# Patient Record
Sex: Male | Born: 1940 | Race: White | Hispanic: No | Smoking: Former smoker
Health system: Southern US, Community
[De-identification: ages and names within clinical notes are randomized; demographics above are authoritative.]

## PROBLEM LIST (undated history)

## (undated) DIAGNOSIS — I10 Essential (primary) hypertension: Secondary | ICD-10-CM

## (undated) DIAGNOSIS — G43909 Migraine, unspecified, not intractable, without status migrainosus: Secondary | ICD-10-CM

## (undated) DIAGNOSIS — R51 Headache: Secondary | ICD-10-CM

## (undated) DIAGNOSIS — J9601 Acute respiratory failure with hypoxia: Secondary | ICD-10-CM

## (undated) DIAGNOSIS — J189 Pneumonia, unspecified organism: Secondary | ICD-10-CM

## (undated) DIAGNOSIS — R519 Headache, unspecified: Secondary | ICD-10-CM

## (undated) DIAGNOSIS — I82409 Acute embolism and thrombosis of unspecified deep veins of unspecified lower extremity: Secondary | ICD-10-CM

## (undated) DIAGNOSIS — Z9981 Dependence on supplemental oxygen: Secondary | ICD-10-CM

## (undated) DIAGNOSIS — K219 Gastro-esophageal reflux disease without esophagitis: Secondary | ICD-10-CM

## (undated) DIAGNOSIS — I5032 Chronic diastolic (congestive) heart failure: Secondary | ICD-10-CM

## (undated) DIAGNOSIS — J449 Chronic obstructive pulmonary disease, unspecified: Secondary | ICD-10-CM

## (undated) HISTORY — DX: Chronic obstructive pulmonary disease, unspecified: J44.9

## (undated) HISTORY — DX: Pneumonia, unspecified organism: J18.9

## (undated) HISTORY — PX: INGUINAL HERNIA REPAIR: SUR1180

## (undated) HISTORY — DX: Essential (primary) hypertension: I10

---
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
## 1988-10-07 DIAGNOSIS — I82409 Acute embolism and thrombosis of unspecified deep veins of unspecified lower extremity: Secondary | ICD-10-CM

## 1988-10-07 HISTORY — DX: Acute embolism and thrombosis of unspecified deep veins of unspecified lower extremity: I82.409

## 1997-06-01 ENCOUNTER — Ambulatory Visit (HOSPITAL_COMMUNITY): Admission: RE | Admit: 1997-06-01 | Discharge: 1997-06-01 | Payer: Self-pay

## 2003-04-20 ENCOUNTER — Inpatient Hospital Stay (HOSPITAL_COMMUNITY): Admission: EM | Admit: 2003-04-20 | Discharge: 2003-04-23 | Payer: Self-pay

## 2003-05-11 ENCOUNTER — Encounter: Admission: RE | Admit: 2003-05-11 | Discharge: 2003-05-11 | Payer: Self-pay | Admitting: Internal Medicine

## 2003-12-16 ENCOUNTER — Inpatient Hospital Stay (HOSPITAL_COMMUNITY): Admission: EM | Admit: 2003-12-16 | Discharge: 2003-12-20 | Payer: Self-pay | Admitting: Emergency Medicine

## 2003-12-28 ENCOUNTER — Ambulatory Visit: Payer: Self-pay | Admitting: Critical Care Medicine

## 2003-12-30 ENCOUNTER — Ambulatory Visit: Payer: Self-pay | Admitting: Critical Care Medicine

## 2004-01-28 ENCOUNTER — Ambulatory Visit: Payer: Self-pay

## 2004-01-28 ENCOUNTER — Ambulatory Visit: Payer: Self-pay | Admitting: Cardiology

## 2004-02-10 ENCOUNTER — Ambulatory Visit: Payer: Self-pay | Admitting: Critical Care Medicine

## 2004-03-09 ENCOUNTER — Ambulatory Visit: Payer: Self-pay | Admitting: Critical Care Medicine

## 2004-05-02 ENCOUNTER — Ambulatory Visit: Payer: Self-pay | Admitting: Critical Care Medicine

## 2005-06-30 ENCOUNTER — Ambulatory Visit: Payer: Self-pay | Admitting: Critical Care Medicine

## 2005-08-31 ENCOUNTER — Ambulatory Visit: Payer: Self-pay | Admitting: Critical Care Medicine

## 2005-10-27 ENCOUNTER — Ambulatory Visit: Payer: Self-pay | Admitting: Pulmonary Disease

## 2008-10-21 ENCOUNTER — Inpatient Hospital Stay (HOSPITAL_COMMUNITY): Admission: EM | Admit: 2008-10-21 | Discharge: 2008-11-06 | Payer: Self-pay | Admitting: Emergency Medicine

## 2008-10-21 ENCOUNTER — Ambulatory Visit: Payer: Self-pay | Admitting: Critical Care Medicine

## 2008-10-21 ENCOUNTER — Ambulatory Visit: Payer: Self-pay | Admitting: Cardiology

## 2008-10-26 ENCOUNTER — Encounter (INDEPENDENT_AMBULATORY_CARE_PROVIDER_SITE_OTHER): Payer: Self-pay | Admitting: Internal Medicine

## 2008-10-30 ENCOUNTER — Ambulatory Visit: Payer: Self-pay | Admitting: Infectious Diseases

## 2008-11-09 ENCOUNTER — Ambulatory Visit: Payer: Self-pay | Admitting: Critical Care Medicine

## 2008-11-09 DIAGNOSIS — J449 Chronic obstructive pulmonary disease, unspecified: Secondary | ICD-10-CM | POA: Insufficient documentation

## 2008-11-09 DIAGNOSIS — I10 Essential (primary) hypertension: Secondary | ICD-10-CM

## 2008-11-11 ENCOUNTER — Telehealth: Payer: Self-pay | Admitting: Critical Care Medicine

## 2008-11-24 ENCOUNTER — Ambulatory Visit: Payer: Self-pay | Admitting: Critical Care Medicine

## 2008-12-03 ENCOUNTER — Ambulatory Visit: Payer: Self-pay | Admitting: Critical Care Medicine

## 2008-12-03 ENCOUNTER — Ambulatory Visit: Admission: RE | Admit: 2008-12-03 | Discharge: 2008-12-03 | Payer: Self-pay | Admitting: Critical Care Medicine

## 2008-12-03 ENCOUNTER — Encounter: Payer: Self-pay | Admitting: Critical Care Medicine

## 2008-12-04 ENCOUNTER — Encounter: Payer: Self-pay | Admitting: Critical Care Medicine

## 2008-12-25 ENCOUNTER — Ambulatory Visit: Payer: Self-pay | Admitting: Critical Care Medicine

## 2008-12-25 ENCOUNTER — Encounter (INDEPENDENT_AMBULATORY_CARE_PROVIDER_SITE_OTHER): Payer: Self-pay | Admitting: *Deleted

## 2008-12-25 DIAGNOSIS — K219 Gastro-esophageal reflux disease without esophagitis: Secondary | ICD-10-CM

## 2008-12-30 ENCOUNTER — Telehealth: Payer: Self-pay | Admitting: Critical Care Medicine

## 2009-01-08 ENCOUNTER — Ambulatory Visit: Payer: Self-pay | Admitting: Critical Care Medicine

## 2009-01-08 ENCOUNTER — Telehealth: Payer: Self-pay | Admitting: Critical Care Medicine

## 2009-02-15 ENCOUNTER — Telehealth: Payer: Self-pay | Admitting: Internal Medicine

## 2009-03-09 ENCOUNTER — Ambulatory Visit: Payer: Self-pay | Admitting: Critical Care Medicine

## 2009-03-09 ENCOUNTER — Encounter: Payer: Self-pay | Admitting: Critical Care Medicine

## 2009-03-09 ENCOUNTER — Telehealth: Payer: Self-pay | Admitting: Critical Care Medicine

## 2009-03-24 ENCOUNTER — Ambulatory Visit: Payer: Self-pay | Admitting: Critical Care Medicine

## 2009-03-26 ENCOUNTER — Telehealth: Payer: Self-pay | Admitting: Critical Care Medicine

## 2009-04-16 ENCOUNTER — Ambulatory Visit: Payer: Self-pay | Admitting: Critical Care Medicine

## 2009-04-20 ENCOUNTER — Telehealth: Payer: Self-pay | Admitting: Critical Care Medicine

## 2009-07-30 ENCOUNTER — Ambulatory Visit: Payer: Self-pay | Admitting: Critical Care Medicine

## 2009-11-09 ENCOUNTER — Ambulatory Visit: Payer: Self-pay | Admitting: Critical Care Medicine

## 2010-01-24 ENCOUNTER — Inpatient Hospital Stay (HOSPITAL_COMMUNITY)
Admission: EM | Admit: 2010-01-24 | Discharge: 2010-01-28 | Payer: Self-pay | Source: Home / Self Care | Attending: Pulmonary Disease | Admitting: Pulmonary Disease

## 2010-02-01 ENCOUNTER — Telehealth: Payer: Self-pay | Admitting: Critical Care Medicine

## 2010-02-02 ENCOUNTER — Ambulatory Visit
Admission: RE | Admit: 2010-02-02 | Discharge: 2010-02-02 | Payer: Self-pay | Source: Home / Self Care | Attending: Critical Care Medicine | Admitting: Critical Care Medicine

## 2010-02-02 ENCOUNTER — Ambulatory Visit: Payer: Self-pay | Admitting: Critical Care Medicine

## 2010-02-02 DIAGNOSIS — J189 Pneumonia, unspecified organism: Secondary | ICD-10-CM | POA: Insufficient documentation

## 2010-02-11 ENCOUNTER — Encounter: Payer: Self-pay | Admitting: Critical Care Medicine

## 2010-02-11 ENCOUNTER — Telehealth (INDEPENDENT_AMBULATORY_CARE_PROVIDER_SITE_OTHER): Payer: Self-pay | Admitting: *Deleted

## 2010-03-01 ENCOUNTER — Encounter: Payer: Self-pay | Admitting: Critical Care Medicine

## 2010-03-02 ENCOUNTER — Ambulatory Visit
Admission: RE | Admit: 2010-03-02 | Discharge: 2010-03-02 | Payer: Self-pay | Source: Home / Self Care | Attending: Critical Care Medicine | Admitting: Critical Care Medicine

## 2010-03-04 ENCOUNTER — Telehealth (INDEPENDENT_AMBULATORY_CARE_PROVIDER_SITE_OTHER): Payer: Self-pay | Admitting: *Deleted

## 2010-03-10 NOTE — Progress Notes (Signed)
Summary: Needs OV  Phone Note Outgoing Call   Reason for Call: Confirm/change Appt Summary of Call: needs post hosp ov  Initial call taken by: Storm Frisk MD,  February 01, 2010 2:27 PM  Follow-up for Phone Call        Pt already has OV scheduled for 12/28/11at 9:45 am with PW.    Called, spoke with pt.  He was reminded of pending appt for tomorrow.  He verbalized understanding. Follow-up by: Gweneth Dimitri RN,  February 01, 2010 3:24 PM

## 2010-03-10 NOTE — Miscellaneous (Signed)
Summary: cxr order  Clinical Lists Changes  Problems: Reactivated problem of PNEUMONIA (ICD-486) Orders: Added new Test order of T-2 View CXR (71020TC) - Signed

## 2010-03-10 NOTE — Progress Notes (Signed)
Summary: Try levaquin 750 x 5 days for green sputum  Phone Note Call from Patient Call back at Home Phone [phone]   Caller: Patient Call For: wright Summary of Call: Taking mucinex dm for congestion, isn't working, please advise. Initial call taken by: Darletta Moll,  February 15, 2009 9:15 AM  Follow-up for Phone Call        [facility]. Carron Curie CMA  February 15, 2009 9:37 AM   Spoke with pt; chest congestion since Christmas; productive cough-green. SOB and wheezing; denies fever,chills,N&V,D. Pt using Mucinex DM two times a day already. Please advise of rx to give pt and other things he can do.  try levaquin 750 one daily x 5 days  ALLERGIES:Byslin, Codeine.  Reynaldo Minium CMA  February 15, 2009 9:51 AM  Follow-up by: Nyoka Cowden MD,  February 15, 2009 11:40 AM  Additional Follow-up for Phone Call Additional follow up Details #1::        rx sent. pt aware. Carron Curie CMA  February 15, 2009 11:41 AM     New/Updated Medications: LEVAQUIN 750 MG TABS (LEVOFLOXACIN) Take 1 tablet by mouth once a day Prescriptions: LEVAQUIN 750 MG TABS (LEVOFLOXACIN) Take 1 tablet by mouth once a day  #5 x 0   Entered by:   Carron Curie CMA   Authorized by:   Nyoka Cowden MD   Signed by:   Carron Curie CMA on 02/15/2009   Method used:   Electronically to        [facility]  [address] [phone]* (retail)       [address]       [place], [place]  [address]       Ph: [phone]       Fax: [phone]   RxID:   [id number]

## 2010-03-10 NOTE — Letter (Signed)
Summary: Out of Work  [employer]  [address]   [place], [place] [address]   Phone: [phone]  Fax: [phone]    04/16/2009  TO: Alfonso Patten     [facility] Specialist     [facility]     [address] [place], [place]  [address]  RE: [name] [name] [address] [id number]       The above named individual is currently under my care and has chronic lung disease.  He is on oxygen therapy at night and uses portable oxygen as needed during the day.  The portable device is not a hazardous material.   He may keep this oxygen device in his truck for as needed  use.      If you have any further questions or need additional information, please call.     Sincerely,    typed by: Storm Frisk MD

## 2010-03-10 NOTE — Assessment & Plan Note (Signed)
Summary: Pulmonary OV   Primary Provider/Referring Provider:  [facility]  CC:  Post hospital follow up with CXR.  States breathing is "excellent."  Cough has improved.  Mucus is now pale yellow and almost clear.  wheezing at times.  Denies chest tightness and SOB.Marland Kitchen  History of Present Illness: Pulmonary OV: 70 yo WM with COPD Golds Stage III.    November 09, 2009 9:30 AM Pt is now back to work.  NOw is worse over past two weeks . Pt is more congested and cough is productive of green mucus.  No real sinus drip.   Cold air in work environment.  No chest pain. No f/c/s.  No edema in feet.   February 02, 2010 9:44 AM This pt was in [facility] 12/19 -12/23 for PNA LLL.  All c/s were negative.  Pt had airspace disease in the LLL.  Now dyspnea is better than before.  Mucus is less.  Mucus is now not discolored.  There is no chest pain.    now: min cough as before.  mucus comes out,  green white color.  No real chest pain.  Dyspnea is better compared to before.  There is no fever/chills/sweats.  There is no excess mucus now.    Current Medications (verified): 1)  Advair Diskus 250-50 Mcg/dose Aepb (Fluticasone-Salmeterol) .Marland Kitchen.. 1 Puff  Twice Daily 2)  Proair Hfa 108 (90 Base) Mcg/act  Aers (Albuterol Sulfate) .Marland Kitchen.. 1-2 Puffs Every 4-6 Hours As Needed 3)  Spiriva Handihaler 18 Mcg  Caps (Tiotropium Bromide Monohydrate) .... Two Puffs in Handihaler Daily 4)  Avelox 400 Mg Tabs (Moxifloxacin Hcl) .... Take 1 Tablet By Mouth Once A Day 5)  Mucinex Dm 30-600 Mg Xr12h-Tab (Dextromethorphan-Guaifenesin) .... Take 1 Tablet By Mouth Two Times A Day 6)  Oxygen .Marland Kitchen.. 3l Continuous 7)  Hydrochlorothiazide 25 Mg Tabs (Hydrochlorothiazide) .... Take 1 Tablet By Mouth Once A Day  Allergies (verified): 1)  ! * Byslin 2)  ! Codeine  Past History:  Past medical, surgical, family and social histories (including risk factors) reviewed for relevance to current acute and chronic problems.  Past  Medical History: COPD (ICD-496) Chronic BRONCHITIS (ICD-490) PNEUMONIA (ICD-486)     -Hflu RUL PNA and resp failure 9/15- 11/06/08     - Recurrent CAP NOS LLL 12/19- 01/28/10. Hypertension  Past Surgical History: Reviewed history from 11/09/2008 and no changes required. Tonsillectomy Hernia surgery 1990's  Family History: Reviewed history from 11/09/2008 and no changes required. mother-breast ca  Social History: Reviewed history from 07/30/2009 and no changes required. quit in 2000.  <1ppd x54yrs.   divorced lives alone Works with [employer]  Review of Systems       The patient complains of shortness of breath with activity and productive cough.  The patient denies shortness of breath at rest, non-productive cough, coughing up blood, chest pain, irregular heartbeats, acid heartburn, indigestion, loss of appetite, weight change, abdominal pain, difficulty swallowing, sore throat, tooth/dental problems, headaches, nasal congestion/difficulty breathing through nose, sneezing, itching, ear ache, anxiety, depression, hand/feet swelling, joint stiffness or pain, rash, change in color of mucus, and fever.    Vital Signs:  Patient profile:   70 year old male Height:      67 inches Weight:      151.38 pounds BMI:     23.80 O2 Sat:      92 % on 3 L/mincont Temp:     98.3 degrees F oral Pulse rate:  104 / minute BP sitting:   128 / 80  (right arm) Cuff size:   regular  Vitals Entered By: Gweneth Dimitri RN (February 02, 2010 9:35 AM)  O2 Flow:  3 L/mincont CC: Post hospital follow up with CXR.  States breathing is "excellent."  Cough has improved.  Mucus is now pale yellow and almost clear.  wheezing at times.  Denies chest tightness and SOB. Comments Medications reviewed with patient Daytime contact number verified with patient. Gweneth Dimitri RN  February 02, 2010 9:36 AM    Physical Exam  Additional Exam:  General: thin, well developed adult in NAD HEENT: [place]/AT.   Mucus membranes pink/moist.  No JVD.  No lymphadenopathy.  Neuro: Awake, alert, oriented.  MAE, speech clear.  EOMI, PERRLA CV: S1S2 RRR, no murmurs/rubs/gallops.  No carotid bruits. Radial pulses +2.   Pulm: Resp's even and non-labored.  Lungs with distant BS,  GI: abdomen round/soft, non-tender to palpation.  BSx4 active. No organomegaly. Skin: no rashes or lesions Ext: warm/dry.  No edema      Clinical Reports Reviewed:  CXR:  03/09/2009: CXR Results:  Findings: Findings compatible architectural distortion of the medial aspect of the right lung.  No definite active infiltrate. Focal scarring left base as well.  Normal cardiomediastinal silhouette.  COPD.   IMPRESSION: Stable right midlung zone parenchymal density suggesting scarring. No definite acute infiltrate.    11/24/2008: CXR Results:  IMPRESSION: Persistent airspace disease right midlung and right middle lobe without significant change in aeration.  No pulmonary edema.  11/09/2008: CXR Results:  Findings: Persistent air space disease is seen in a right perihilar distribution.  Air space disease does extend into the right upper lobe and right middle lobe.  There are also changes within the superior segment of the right lower lobe. When compared to prior study, there has been no significant interval change in aeration. There is volume loss noted in this region.  The left lung is clear. The heart is normal in size.   IMPRESSION: Persistent air space disease with volume loss noted as described above.  There has been no significant change in aeration when compared with 11/04/2008.   Impression & Recommendations:  Problem # 1:  PNEUMONIA (ICD-486) Assessment Improved Recurent LLL CAP NOS.  Now improved in setting of advanced COPD plan finish current avelox rx cont oxygen 24/7 use flutter valve four times daily cont BD therapy  His updated medication list for this problem includes:    Avelox 400 Mg Tabs  (Moxifloxacin hcl) .Marland Kitchen... Take 1 tablet by mouth once a day  Orders: Est. Patient Level IV (84696)  Medications Added to Medication List This Visit: 1)  Avelox 400 Mg Tabs (Moxifloxacin hcl) .... Take 1 tablet by mouth once a day 2)  Mucinex Dm 30-600 Mg Xr12h-tab (Dextromethorphan-guaifenesin) .... Take 1 tablet by mouth two times a day 3)  Oxygen  .Marland Kitchen.. 3l continuous 4)  Hydrochlorothiazide 25 Mg Tabs (Hydrochlorothiazide) .... Take 1 tablet by mouth once a day 5)  Flutter Devi (Respiratory therapy supplies) .... Use 4 times daily  Complete Medication List: 1)  Advair Diskus 250-50 Mcg/dose Aepb (Fluticasone-salmeterol) .Marland Kitchen.. 1 puff  twice daily 2)  Proair Hfa 108 (90 Base) Mcg/act Aers (Albuterol sulfate) .Marland Kitchen.. 1-2 puffs every 4-6 hours as needed 3)  Spiriva Handihaler 18 Mcg Caps (Tiotropium bromide monohydrate) .... Two puffs in handihaler daily 4)  Avelox 400 Mg Tabs (Moxifloxacin hcl) .... Take 1 tablet by mouth once a day 5)  Mucinex  Dm 30-600 Mg Xr12h-tab (Dextromethorphan-guaifenesin) .... Take 1 tablet by mouth two times a day 6)  Oxygen  .Marland Kitchen.. 3l continuous 7)  Hydrochlorothiazide 25 Mg Tabs (Hydrochlorothiazide) .... Take 1 tablet by mouth once a day 8)  Flutter Devi (Respiratory therapy supplies) .... Use 4 times daily  Other Orders: DME Referral (DME)  Patient Instructions: 1)  Return one month , chest xray first 2)  Finish antibiotics 3)  A light weight portable oxygen system will be obtained 4)  No other medication changes 5)  Use flutter valve 4 times daily 6)  Stay on oxygen 3Liters

## 2010-03-10 NOTE — Assessment & Plan Note (Signed)
Summary: Pulmonary OV   Primary Provider/Referring Provider:  [facility]  CC:  1 month follow up with CXR.  Pt states SOB comes and goes.  c/o chest congestion x 4 days.  Sligh cough - prod with small amount of green mucus x 2 days.  Denies f/c/s.Marland Kitchen  History of Present Illness: Pulmonary OV: 70 yo WM with COPD Golds Stage III.    November 09, 2009 9:30 AM Pt is now back to work.  NOw is worse over past two weeks . Pt is more congested and cough is productive of green mucus.  No real sinus drip.   Cold air in work environment.  No chest pain. No f/c/s.  No edema in feet.   February 02, 2010 9:44 AM This pt was in [facility] 12/19 -12/23 for PNA LLL.  All c/s were negative.  Pt had airspace disease in the LLL.  Now dyspnea is better than before.  Mucus is less.  Mucus is now not discolored.  There is no chest pain.    now: min cough as before.  mucus comes out,  green white color.  No real chest pain.  Dyspnea is better compared to before.  There is no fever/chills/sweats.  There is no excess mucus now  March 02, 2010 11:51 AM f/u pna.  notes sl cough last 3-4 days.  feels like URI,  is back at work. No real chest pain, notes some tightness.Mucus now is sl discolored.  Dyspnea is better.  min pn drip.      Current Medications (verified): 1)  Advair Diskus 250-50 Mcg/dose Aepb (Fluticasone-Salmeterol) .Marland Kitchen.. 1 Puff  Twice Daily 2)  Proair Hfa 108 (90 Base) Mcg/act  Aers (Albuterol Sulfate) .Marland Kitchen.. 1-2 Puffs Every 4-6 Hours As Needed 3)  Spiriva Handihaler 18 Mcg  Caps (Tiotropium Bromide Monohydrate) .... Two Puffs in Handihaler Daily 4)  Mucinex Dm 30-600 Mg Xr12h-Tab (Dextromethorphan-Guaifenesin) .... Take 1 Tablet By Mouth Two Times A Day 5)  Oxygen .Marland Kitchen.. 3l Continuous At Bedtime 6)  Hydrochlorothiazide 25 Mg Tabs (Hydrochlorothiazide) .... Take 1 Tablet By Mouth Once A Day 7)  Flutter  Devi (Respiratory Therapy Supplies) .... Use 4 Times Daily  Allergies (verified): 1)  ! *  Byslin 2)  ! Codeine  Past History:  Past medical, surgical, family and social histories (including risk factors) reviewed, and no changes noted (except as noted below).  Past Medical History: Reviewed history from 02/02/2010 and no changes required. COPD (ICD-496) Chronic BRONCHITIS (ICD-490) PNEUMONIA (ICD-486)     -Hflu RUL PNA and resp failure 9/15- 11/06/08     - Recurrent CAP NOS LLL 12/19- 01/28/10. Hypertension  Past Surgical History: Reviewed history from 11/09/2008 and no changes required. Tonsillectomy Hernia surgery 1990's  Family History: Reviewed history from 11/09/2008 and no changes required. mother-breast ca  Social History: Reviewed history from 07/30/2009 and no changes required. quit in 2000.  <1ppd x89yrs.   divorced lives alone Works with [employer]  Review of Systems       The patient complains of shortness of breath with activity, productive cough, and non-productive cough.  The patient denies shortness of breath at rest, coughing up blood, chest pain, irregular heartbeats, acid heartburn, indigestion, loss of appetite, weight change, abdominal pain, difficulty swallowing, sore throat, tooth/dental problems, headaches, nasal congestion/difficulty breathing through nose, sneezing, itching, ear ache, anxiety, depression, hand/feet swelling, joint stiffness or pain, rash, change in color of mucus, and fever.    Vital Signs:  Patient  profile:   70 year old male Height:      67 inches Weight:      144.38 pounds BMI:     22.69 O2 Sat:      93 % on Room air Temp:     98.4 degrees F oral Pulse rate:   91 / minute BP sitting:   116 / 70  (right arm) Cuff size:   regular  Vitals Entered By: Gweneth Dimitri RN (March 02, 2010 11:36 AM)  O2 Flow:  Room air CC: 1 month follow up with CXR.  Pt states SOB comes and goes.  c/o chest congestion x 4 days.  Sligh cough - prod with small amount of green mucus x 2 days.  Denies f/c/s. Comments  Medications reviewed with patient Daytime contact number verified with patient. Gweneth Dimitri RN  March 02, 2010 11:37 AM    Physical Exam  Additional Exam:  General: thin, well developed adult in NAD HEENT: [place]/AT.  Mucus membranes pink/moist.  No JVD.  No lymphadenopathy.  Neuro: Awake, alert, oriented.  MAE, speech clear.  EOMI, PERRLA CV: S1S2 RRR, no murmurs/rubs/gallops.  No carotid bruits. Radial pulses +2.   Pulm: Resp's even and non-labored.  Lungs with distant BS,  GI: abdomen round/soft, non-tender to palpation.  BSx4 active. No organomegaly. Skin: no rashes or lesions Ext: warm/dry.  No edema      CXR  Procedure date:  03/02/2010  Findings:      IMPRESSION:   1.  Persistent lingular airspace opacity.  Recommend continued follow-up to clearing. 2.  Emphysema.  Impression & Recommendations:  Problem # 1:  PNEUMONIA (ICD-486) Assessment Improved Cap with persistent LLL airspace disease c/w organized pneumonia but acute infection now cleared plan cont to follow cxr to clearance no further ABX Orders: Est. Patient Level III (04540)  Problem # 2:  COPD (ICD-496) Assessment: Improved  COPD Golds stage III. Stable, improved oxygenation s/p recent CAP  plan cont oxygen at bedtime , as needed daytime oxygen No change in inhaled medications.   Maintain treatment program as currently prescribed.    Medications Added to Medication List This Visit: 1)  Oxygen  .Marland Kitchen.. 3l continuous at bedtime  Complete Medication List: 1)  Advair Diskus 250-50 Mcg/dose Aepb (Fluticasone-salmeterol) .Marland Kitchen.. 1 puff  twice daily 2)  Proair Hfa 108 (90 Base) Mcg/act Aers (Albuterol sulfate) .Marland Kitchen.. 1-2 puffs every 4-6 hours as needed 3)  Spiriva Handihaler 18 Mcg Caps (Tiotropium bromide monohydrate) .... Two puffs in handihaler daily 4)  Mucinex Dm 30-600 Mg Xr12h-tab (Dextromethorphan-guaifenesin) .... Take 1 tablet by mouth two times a day 5)  Oxygen  .Marland Kitchen.. 3l continuous at bedtime 6)   Hydrochlorothiazide 25 Mg Tabs (Hydrochlorothiazide) .... Take 1 tablet by mouth once a day 7)  Flutter Devi (Respiratory therapy supplies) .... Use 4 times daily  Patient Instructions: 1)  No change in medications 2)  Return in    3      months

## 2010-03-10 NOTE — Progress Notes (Signed)
Summary: note for work  Phone Note Call from Patient Call back at [employer] [phone]   Caller: Patient Call For: wright Summary of Call: Pt states he needs a note to return to work on Monday. Initial call taken by: Darletta Moll,  February 11, 2010 12:47 PM  Follow-up for Phone Call        Pt was in the hospital and saw Dr. Delford Field on 02-02-10 for HFU. Pt needs a letter stating it is ok for him to return to work on 02-14-10. pt will pick-up letter when it its ready. Please advsie if ok to write letter.  Carron Curie CMA  February 11, 2010 12:53 PM   Additional Follow-up for Phone Call Additional follow up Details #1::        ok for the note Additional Follow-up by: Storm Frisk MD,  February 11, 2010 1:39 PM    Additional Follow-up for Phone Call Additional follow up Details #2::    Letter was created and left up front for pt to pick up.  Pt made aware. Follow-up by: Vernie Murders,  February 11, 2010 2:21 PM

## 2010-03-10 NOTE — Letter (Signed)
Summary: [employer]  [address]   [place], [place] [address]   Phone: [phone]  Fax: [phone]    Today's Date: February 11, 2010  Name of Patient: [name] [name]  The above named patient had a medical visit on 02/02/10.  Please take this into consideration when reviewing the time away from work/school.    Special Instructions:  [  ] None  [  ] To be off the remainder of today, returning to the normal work / school schedule tomorrow.  [  ] To be off until the next scheduled appointment on ______________________.  [ x ] Other- Pt may return to work on Monday, January 9th, 2012   Sincerely yours,        Dr. Shan Levans

## 2010-03-10 NOTE — Assessment & Plan Note (Signed)
Summary: Pulmonary OV   Primary Provider/Referring Provider:  [facility]  CC:  2 mo follow up.  c/o prod cough with green mucus and wheezing x4 days.  denies chest tightness and fever.  states breathing is the same-still having good days and bad days.Marland Kitchen  History of Present Illness: Pulmonary OV: 70 yo WM with COPD Golds Stage III.  Recent admit 9/10 forH Flu CAP and VDRF.  Not seen since in office since  2007.  admitted 9/15-10/1 HFlu PNA Cap, septic shock,  vent support The pt presented with resp distress The pt was ill tues prior to admit The pt then noted wed 2am  suddenly sob and low bp and tfr to ED from [facility]. Rx vent/ steroids/ABX .  D/C 11/06/08.    December 25, 2008  Pt presents for follow up on PNA.  Recently admitted from 9/15-10/1 for VDRF/PNA at [facility].  Bronchoscopy performed on 10/28 with normal tissue returned, mucus plugging RML.  Patient reports that he feels better and is continuing to use his IS at home-is able to pull 2500 volumes.  Reports mild shortness of breath this am but relates it to anxiety regarding appointment mix-up today. Minimial non-productive cough and shortness of breath with activity.  At office he was able to walk down to the lab and back up without O2 and reports he tolerated well.   He indicates he is ready to return back to work.  Relates that most of his activity is later in the day at work.  He also complains of burping and reflux symptoms.  Pt denies any significant sore throat, nasal congestion or excess secretions, fever, chills, sweats, unintended weight loss, pleurtic or exertional chest pain, orthopnea PND, or leg swelling   January 08, 2009 11:04 AM Pt is improved with improved oxygenation.  The pt uses oxygen at night and  is off portable system Pt is now back at work Has a sl cold now with am mucous  sl yellow-green Pt had FOB with mucous plugging seen in RML  no CA seen.March 09, 2009 9:24 AM c/o bad cold.  three days.   Notes some mucous in the am moderate amount. No chest pain.  Sl amount of wheezes.  No real fever. Sinuses are congested.   Working now.  Preventive Screening-Counseling & Management  Alcohol-Tobacco     Smoking Status: quit > 6 months  Current Medications (verified): 1)  Advair Diskus 250-50 Mcg/dose Aepb (Fluticasone-Salmeterol) .Marland Kitchen.. 1 Puff Two Times A Day 2)  Combivent 103-18 Mcg/act Aero (Ipratropium-Albuterol) .... 2 Puffs Every 4 Hours As Needed 3)  Oxygen .... 2liter Rest ;  3 Liter Exertion 4)  Spiriva Handihaler 18 Mcg  Caps (Tiotropium Bromide Monohydrate) .... Two Puffs in Handihaler Daily  Allergies (verified): 1)  ! * Byslin 2)  ! Codeine  Past History:  Past medical, surgical, family and social histories (including risk factors) reviewed, and no changes noted (except as noted below).  Past Medical History: Reviewed history from 11/09/2008 and no changes required. COPD (ICD-496) BRONCHITIS (ICD-490) PNEUMONIA (ICD-486)     -Hflu RUL PNA and resp failure 9/15- 11/06/08 Hypertension  Past Surgical History: Reviewed history from 11/09/2008 and no changes required. Tonsillectomy Hernia surgery 1990's  Family History: Reviewed history from 11/09/2008 and no changes required. mother-breast ca  Social History: Reviewed history from 11/09/2008 and no changes required. Patient never smoked.  quit in 2000.  <1ppd x74yrs.   divorced lives alone Works with [employer]  Schools  Review of Systems       The patient complains of shortness of breath with activity, shortness of breath at rest, productive cough, non-productive cough, acid heartburn, nasal congestion/difficulty breathing through nose, and change in color of mucus.  The patient denies coughing up blood, chest pain, irregular heartbeats, indigestion, loss of appetite, weight change, abdominal pain, difficulty swallowing, sore throat, tooth/dental problems, headaches, sneezing, itching, ear ache, anxiety,  depression, hand/feet swelling, joint stiffness or pain, rash, and fever.    Vital Signs:  Patient profile:   70 year old male Height:      67 inches Weight:      149.38 pounds BMI:     23.48 O2 Sat:      87 % on Room air Temp:     98.2 degrees F oral Pulse rate:   102 / minute BP sitting:   120 / 78  (left arm) Cuff size:   regular  Vitals Entered By: Gweneth Dimitri RN (March 09, 2009 9:18 AM)  O2 Flow:  Room air  O2 Sat Comments arrived to exam room with o2 sat of 87% ra.  After resting for a few minutes, o2 sat increased to 93% ra with pulse of 94.  Crystal Jones RN  March 09, 2009 9:22 AM  CC: 2 mo follow up.  c/o prod cough with green mucus and wheezing x4 days.  denies chest tightness and fever.  states breathing is the same-still having good days and bad days. Comments Medications reviewed with patient Daytime contact number verified with patient. Gweneth Dimitri RN  March 09, 2009 9:20 AM    Physical Exam  Additional Exam:  General: thin, well developed adult in NAD HEENT: [place]/AT.  Mucus membranes pink/moist.  No JVD.  No lymphadenopathy.  Neuro: Awake, alert, oriented.  MAE, speech clear.  EOMI, PERRLA CV: S1S2 RRR, no murmurs/rubs/gallops.  No carotid bruits. Radial pulses +2.   Pulm: Resp's even and non-labored.  Lungs with exp wheezes and rhonchi RLL GI: abdomen round/soft, non-tender to palpation.  BSx4 active. No organomegaly. Skin: no rashes or lesions Ext: warm/dry.  No edema      Impression & Recommendations:  Problem # 1:  BRONCHITIS (ICD-490) Assessment Deteriorated  Acute tracheobronchitits with flare Plan: Avelox 400mg /d for 7 days Pulse prednisone CXR today The following medications were removed from the medication list:    Levaquin 750 Mg Tabs (Levofloxacin) .Marland Kitchen... Take 1 tablet by mouth once a day His updated medication list for this problem includes:    Advair Diskus 250-50 Mcg/dose Aepb (Fluticasone-salmeterol) .Marland Kitchen... 1 puff two times  a day    Combivent 103-18 Mcg/act Aero (Ipratropium-albuterol) .Marland Kitchen... 2 puffs every 4 hours as needed    Spiriva Handihaler 18 Mcg Caps (Tiotropium bromide monohydrate) .Marland Kitchen..Marland Kitchen Two puffs in handihaler daily    Avelox 400 Mg Tabs (Moxifloxacin hcl) ..... By mouth daily  Orders: Est. Patient Level IV (04540) Prescription Created Electronically [phone]) T-2 View CXR (71020TC)  Medications Added to Medication List This Visit: 1)  Avelox 400 Mg Tabs (Moxifloxacin hcl) .... By mouth daily 2)  Prednisone 10 Mg Tabs (Prednisone) .... Take as directed 4 each am x 4 days, 3 x 4 days, 2 x 4 days, 1 x 4 days then stop  Complete Medication List: 1)  Advair Diskus 250-50 Mcg/dose Aepb (Fluticasone-salmeterol) .Marland Kitchen.. 1 puff two times a day 2)  Combivent 103-18 Mcg/act Aero (Ipratropium-albuterol) .... 2 puffs every 4 hours as needed 3)  Oxygen  .Marland KitchenMarland KitchenMarland Kitchen  2liter rest ;  3 liter exertion 4)  Spiriva Handihaler 18 Mcg Caps (Tiotropium bromide monohydrate) .... Two puffs in handihaler daily 5)  Avelox 400 Mg Tabs (Moxifloxacin hcl) .... By mouth daily 6)  Prednisone 10 Mg Tabs (Prednisone) .... Take as directed 4 each am x 4 days, 3 x 4 days, 2 x 4 days, 1 x 4 days then stop  Patient Instructions: 1)  Avelox for 7days 2)  Prednisone 10mg  4 each am x 4 days, 3 x 4 days, 2 x 4 days, 1 x 4 days then stop 3)  Return 2-3 weeks Prescriptions: PREDNISONE 10 MG  TABS (PREDNISONE) Take as directed 4 each am x 4 days, 3 x 4 days, 2 x 4 days, 1 x 4 days then stop  #40 x 0   Entered and Authorized by:   Storm Frisk MD   Signed by:   Storm Frisk MD on 03/09/2009   Method used:   [facility] to        [facility]  [address] [phone][facility] (retail)       [address]       [place], [place]  [address]       Ph: [phone]       Fax: [phone]   RxID:   [id number] AVELOX 400 MG  TABS (MOXIFLOXACIN HCL) By mouth daily  #7 x 0   Entered and Authorized by:   Storm Frisk MD   Signed by:    Storm Frisk MD on 03/09/2009   Method used:   [facility]        [facility]  [address] [phone]* (retail)       [address]       [place], [place]  [address]       Ph: [phone]       Fax: [phone]   RxID:   [id number]

## 2010-03-10 NOTE — Assessment & Plan Note (Signed)
Summary: Pulmonary OV   Primary Provider/Referring Provider:  [facility]  CC:  6 month follow up. Pt c/o SOB on hot days, chest congestion, dry cough to clear throat, and wheezing. Pt denies chest tightness. Pt states does not use O2 at bedtime as often. Pt requesting refills on inhaled medications.  History of Present Illness: Pulmonary OV: 70 yo WM with COPD Golds Stage III.      July 30, 2009 10:43 AM Not much cough.  Occ will wheeze in throat area and will clear the throat.  Dyspnea is better than before.  Overal lis much better.  Not wearing oxygen Pt denies any significant sore throat, nasal congestion or excess secretions, fever, chills, sweats, unintended weight loss, pleurtic or exertional chest pain, orthopnea PND, or leg swelling Pt denies any increase in rescue therapy over baseline, denies waking up needing it or having any early am or nocturnal exacerbations of coughing/wheezing/or dyspnea. Pt also denies any obvious fluctuation in symptoms wiht weather or environmental change or other alleviating or aggravating factors     Preventive Screening-Counseling & Management  Alcohol-Tobacco     Smoking Status: quit  Allergies: 1)  ! * Byslin 2)  ! Codeine  Past History:  Past medical, surgical, family and social histories (including risk factors) reviewed, and no changes noted (except as noted below).  Past Medical History: Reviewed history from 11/09/2008 and no changes required. COPD (ICD-496) BRONCHITIS (ICD-490) PNEUMONIA (ICD-486)     -Hflu RUL PNA and resp failure 9/15- 11/06/08 Hypertension  Past Surgical History: Reviewed history from 11/09/2008 and no changes required. Tonsillectomy Hernia surgery 1990's  Family History: Reviewed history from 11/09/2008 and no changes required. mother-breast ca  Social History: Reviewed history from 11/09/2008 and no changes required. quit in 2000.  <1ppd x34yrs.   divorced lives alone Works with  [employer] Smoking Status:  quit  Review of Systems       The patient complains of shortness of breath with activity and non-productive cough.  The patient denies shortness of breath at rest, productive cough, coughing up blood, chest pain, irregular heartbeats, acid heartburn, indigestion, loss of appetite, weight change, abdominal pain, difficulty swallowing, sore throat, tooth/dental problems, headaches, nasal congestion/difficulty breathing through nose, sneezing, itching, ear ache, anxiety, depression, hand/feet swelling, joint stiffness or pain, rash, change in color of mucus, and fever.    Vital Signs:  Patient profile:   70 year old male Height:      67 inches Weight:      149 pounds BMI:     23.42 O2 Sat:      93 % on Room air Temp:     98.7 degrees F oral Pulse rate:   89 / minute BP sitting:   126 / 84  (left arm) Cuff size:   regular  Vitals Entered By: Zackery Barefoot CMA (July 30, 2009 10:36 AM)  O2 Flow:  Room air CC: 6 month follow up. Pt c/o SOB on hot days, chest congestion, dry cough to clear throat, wheezing. Pt denies chest tightness. Pt states does not use O2 at bedtime as often. Pt requesting refills on inhaled medications Comments Medications reviewed with patient Verified contact number and pharmacy with patient Zackery Barefoot CMA  July 30, 2009 10:36 AM    Physical Exam  Additional Exam:  General: thin, well developed adult in NAD HEENT: /AT.  Mucus membranes pink/moist.  No JVD.  No lymphadenopathy.  Neuro: Awake, alert, oriented.  MAE, speech clear.  EOMI, PERRLA CV: S1S2 RRR, no murmurs/rubs/gallops.  No carotid bruits. Radial pulses +2.   Pulm: Resp's even and non-labored.  Lungs with distant BS,  no wheeze or rales or rhonchi GI: abdomen round/soft, non-tender to palpation.  BSx4 active. No organomegaly. Skin: no rashes or lesions Ext: warm/dry.  No edema      Impression & Recommendations:  Problem # 1:  COPD  (ICD-496) Assessment Improved  COPD Golds stage III. Stable, improved oxygenation  plan cont oxygen at bedtime but d/c daytime oxygen and exetional oxygen No change in inhaled medications.   Maintain treatment program as currently prescribed.    Medications Added to Medication List This Visit: 1)  Proair Hfa 108 (90 Base) Mcg/act Aers (Albuterol sulfate) .Marland Kitchen.. 1-2 puffs every 4-6 hours as needed 2)  Oxygen  .... 2liter at bedtime  Complete Medication List: 1)  Advair Diskus 250-50 Mcg/dose Aepb (Fluticasone-salmeterol) .Marland Kitchen.. 1 puff two times a day 2)  Proair Hfa 108 (90 Base) Mcg/act Aers (Albuterol sulfate) .Marland Kitchen.. 1-2 puffs every 4-6 hours as needed 3)  Oxygen  .... 2liter at bedtime 4)  Spiriva Handihaler 18 Mcg Caps (Tiotropium bromide monohydrate) .... Two puffs in handihaler daily  Other Orders: Est. Patient Level III (04540) DME Referral (DME)  Patient Instructions: 1)  No change in medications 2)  Return in      4    months 3)  May stop portable oxygen  4)  Stay on nighttime oxygen  Prescriptions: ADVAIR DISKUS 250-50 MCG/DOSE AEPB (FLUTICASONE-SALMETEROL) 1 puff two times a day  #1 x 6   Entered and Authorized by:   Storm Frisk MD   Signed by:   Storm Frisk MD on 07/30/2009   Method used:   [facility]        [facility]  [address] [phone]* (retail)       [address]       [place], [place]  [address]       Ph: [phone]       Fax: [phone]   RxID:   [id number] PROAIR HFA 108 (90 BASE) MCG/ACT  AERS (ALBUTEROL SULFATE) 1-2 puffs every 4-6 hours as needed  #1 x 6   Entered and Authorized by:   Storm Frisk MD   Signed by:   Storm Frisk MD on 07/30/2009   Method used:   [facility]        [facility]  [address] [phone]* (retail)       [address]       [place], [place]  [address]       Ph: [phone]       Fax: [phone]   RxID:   [id number]

## 2010-03-10 NOTE — Assessment & Plan Note (Signed)
Summary: Pulmonary OV   Primary Provider/Referring Provider:  [facility]  CC:  4 month follow up.  States breathing is doing worse over the past couple of weeks.  Having chest congestion and prod cough with greenish yellow mucus x 2 wks.  SOB after coughing.  Denies wheezing, chest tightness, and f/c/s.  Requesting flu vac today.  Marland Kitchen  History of Present Illness: Pulmonary OV: 70 yo WM with COPD Golds Stage III.    November 09, 2009 9:30 AM Pt is now back to work.  NOw is worse over past two weeks . Pt is more congested and cough is productive of green mucus.  No real sinus drip.   Cold air in work environment.  No chest pain. No f/c/s.  No edema in feet.   Preventive Screening-Counseling & Management  Alcohol-Tobacco     Smoking Status: quit     Year Quit: 2007     Pack years: 12  Current Medications (verified): 1)  Advair Diskus 250-50 Mcg/dose Aepb (Fluticasone-Salmeterol) .Marland Kitchen.. 1 Puff Once Daily 2)  Proair Hfa 108 (90 Base) Mcg/act  Aers (Albuterol Sulfate) .Marland Kitchen.. 1-2 Puffs Every 4-6 Hours As Needed 3)  Spiriva Handihaler 18 Mcg  Caps (Tiotropium Bromide Monohydrate) .... Two Puffs in Handihaler Daily  Allergies (verified): 1)  ! * Byslin 2)  ! Codeine  Past History:  Past medical, surgical, family and social histories (including risk factors) reviewed, and no changes noted (except as noted below).  Past Medical History: Reviewed history from 11/09/2008 and no changes required. COPD (ICD-496) BRONCHITIS (ICD-490) PNEUMONIA (ICD-486)     -Hflu RUL PNA and resp failure 9/15- 11/06/08 Hypertension  Past Surgical History: Reviewed history from 11/09/2008 and no changes required. Tonsillectomy Hernia surgery 1990's  Family History: Reviewed history from 11/09/2008 and no changes required. mother-breast ca  Social History: Reviewed history from 07/30/2009 and no changes required. quit in 2000.  <1ppd x7yrs.   divorced lives alone Works with [employer]  Review of Systems       The patient complains of shortness of breath with activity, productive cough, nasal congestion/difficulty breathing through nose, and change in color of mucus.  The patient denies shortness of breath at rest, non-productive cough, coughing up blood, chest pain, irregular heartbeats, acid heartburn, indigestion, loss of appetite, weight change, abdominal pain, difficulty swallowing, sore throat, tooth/dental problems, headaches, sneezing, itching, ear ache, anxiety, depression, hand/feet swelling, joint stiffness or pain, rash, and fever.    Vital Signs:  Patient profile:   70 year old male Height:      67 inches Weight:      151 pounds BMI:     23.74 O2 Sat:      88 % on Room air Temp:     98.2 degrees F oral Pulse rate:   101 / minute BP sitting:   150 / 88  (left arm) Cuff size:   regular  Vitals Entered By: Gweneth Dimitri RN (November 09, 2009 9:24 AM)  O2 Flow:  Room air  O2 Sat Comments Pt arrived to exam room with o2 sat 88% RA.  After resting for a few minutes, o2 sat increased to 90% RA.  Gweneth Dimitri RN  November 09, 2009 9:25 AM  CC: 4 month follow up.  States breathing is doing worse over the past couple of weeks.  Having chest congestion and prod cough with greenish yellow mucus x 2 wks.  SOB after coughing.  Denies wheezing,  chest tightness, f/c/s.  Requesting flu vac today.   Comments Medications reviewed with patient Daytime contact number verified with patient. Gweneth Dimitri RN  November 09, 2009 9:26 AM    Physical Exam  Additional Exam:  General: thin, well developed adult in NAD HEENT: [place]/AT.  Mucus membranes pink/moist.  No JVD.  No lymphadenopathy.  Neuro: Awake, alert, oriented.  MAE, speech clear.  EOMI, PERRLA CV: S1S2 RRR, no murmurs/rubs/gallops.  No carotid bruits. Radial pulses +2.   Pulm: Resp's even and non-labored.  Lungs with distant BS, scattered rhonchi, exp wheezes GI: abdomen round/soft, non-tender to palpation.   BSx4 active. No organomegaly. Skin: no rashes or lesions Ext: warm/dry.  No edema      Impression & Recommendations:  Problem # 1:  ACUTE BRONCHITIS (ICD-466.0) Assessment Deteriorated acute bronchitis with copd flare plan increase advair back to one puff two times a day pulse pred doxy x 7days cont spiriva flu vaccine His updated medication list for this problem includes:    Advair Diskus 250-50 Mcg/dose Aepb (Fluticasone-salmeterol) .Marland Kitchen... 1 puff  twice daily    Proair Hfa 108 (90 Base) Mcg/act Aers (Albuterol sulfate) .Marland Kitchen... 1-2 puffs every 4-6 hours as needed    Spiriva Handihaler 18 Mcg Caps (Tiotropium bromide monohydrate) .Marland Kitchen..Marland Kitchen Two puffs in handihaler daily    Doxycycline Monohydrate 100 Mg Caps (Doxycycline monohydrate) ..... By mouth twice daily  Orders: Prescription Created Electronically [phone]) Est. Patient Level IV (25956)  Medications Added to Medication List This Visit: 1)  Advair Diskus 250-50 Mcg/dose Aepb (Fluticasone-salmeterol) .Marland Kitchen.. 1 puff  twice daily 2)  Prednisone 10 Mg Tabs (Prednisone) .... Take as directed take 4 daily for two days, then 3 daily for two days, then two daily for two days then one daily for two days then stop 3)  Doxycycline Monohydrate 100 Mg Caps (Doxycycline monohydrate) .... By mouth twice daily  Complete Medication List: 1)  Advair Diskus 250-50 Mcg/dose Aepb (Fluticasone-salmeterol) .Marland Kitchen.. 1 puff  twice daily 2)  Proair Hfa 108 (90 Base) Mcg/act Aers (Albuterol sulfate) .Marland Kitchen.. 1-2 puffs every 4-6 hours as needed 3)  Spiriva Handihaler 18 Mcg Caps (Tiotropium bromide monohydrate) .... Two puffs in handihaler daily 4)  Prednisone 10 Mg Tabs (Prednisone) .... Take as directed take 4 daily for two days, then 3 daily for two days, then two daily for two days then one daily for two days then stop 5)  Doxycycline Monohydrate 100 Mg Caps (Doxycycline monohydrate) .... By mouth twice daily  Other Orders: Flu Vaccine 61yrs + MEDICARE PATIENTS  (L8756) Administration Flu vaccine - MCR (E3329) DME Referral (DME)  Patient Instructions: 1)  Flu vaccine today 2)  Doxycycline one twice daily for 7days 3)  Prednisone 10mg  Take 4 daily for two days, then 3 daily for two days, then two daily for two days then one daily for two days then stop 4)  Increase advair to one puff two times a day 5)  No change in spiriva  6)  Stop oxygen 7)  Return 2 months Prescriptions: DOXYCYCLINE MONOHYDRATE 100 MG  CAPS (DOXYCYCLINE MONOHYDRATE) By mouth twice daily  #14 x 0   Entered and Authorized by:   Storm Frisk MD   Signed by:   Storm Frisk MD on 11/09/2009   Method used:   [facility]  [address] [id number]       [address]       [place]  [place], [place]  [address]       Ph: [phone]       Fax: [phone]   RxID:   [id number] PREDNISONE 10 MG  TABS (PREDNISONE) Take as directed Take 4 daily for two days, then 3 daily for two days, then two daily for two days then one daily for two days then stop  #20 x 0   Entered and Authorized by:   Storm Frisk MD   Signed by:   Storm Frisk MD on 11/09/2009   Method used:   [facility]        [facility]  [address] [phone]* (retail)       [address]       [place], [place]  [address]       Ph: [phone]       Fax: [phone]   RxID:   [id number]     Prevention & Chronic Care Immunizations   Influenza vaccine: Fluvax 3+  (11/09/2009)    Tetanus booster: Not documented    Pneumococcal vaccine: Pneumovax  (11/06/2008)    H. zoster vaccine: Not documented  Colorectal Screening   Hemoccult: Not documented    Colonoscopy: Not documented  Other Screening   PSA: Not documented   Smoking status: quit  (11/09/2009)  Lipids   Total Cholesterol: Not documented   LDL: Not documented   LDL Direct: Not documented   HDL: Not documented   Triglycerides: Not documented  Hypertension   Last  Blood Pressure: 150 / 88  (11/09/2009)   Serum creatinine: Not documented   Serum potassium Not documented  Self-Management Support :    Hypertension self-management support: Not documented   Nursing Instructions: Give Flu vaccine today  Flu Vaccine Consent Questions     Do you have a history of severe allergic reactions to this vaccine? no    Any prior history of allergic reactions to egg and/or gelatin? no    Do you have a sensitivity to the preservative Thimersol? no    Do you have a past history of Guillan-Barre Syndrome? no    Do you currently have an acute febrile illness? no    Have you ever had a severe reaction to latex? no    Vaccine information given and explained to patient? yes    Are you currently pregnant? no    Lot Number:[id number]   Exp Date:08/06/2010   Site Given  Left Deltoid IMdflu Gweneth Dimitri RN  November 09, 2009 9:46 AM

## 2010-03-10 NOTE — Miscellaneous (Signed)
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  No definite active infiltrate. Focal scarring left base as well.  Normal cardiomediastinal silhouette.  COPD.   IMPRESSION: Stable right midlung zone parenchymal density suggesting scarring. No definite acute infiltrate.   (03/09/2009 10:37)      CXR  Procedure date:  03/09/2009  Findings:      Findings: Findings compatible architectural distortion of the medial aspect of the right lung.  No definite active infiltrate. Focal scarring left base as well.  Normal cardiomediastinal silhouette.  COPD.   IMPRESSION: Stable right midlung zone parenchymal density suggesting scarring. No definite acute infiltrate.

## 2010-03-10 NOTE — Miscellaneous (Signed)
Summary: cxr order  Clinical Lists Changes  Orders: Added new Test order of T-2 View CXR (71020TC) - Signed 

## 2010-03-10 NOTE — Progress Notes (Signed)
Summary: sick - requesting abx  Phone Note Call from Patient Call back at Home Phone [phone]   Caller: Patient Call For: wright Reason for Call: Acute Illness, Talk to Nurse Summary of Call: Patient was saw Dr. Drema Dallas.  He is calling saying he is more congested and cough is productive of green mucus.  Asking for abx.  [facility] [address] Initial call taken by: Lehman Prom,  March 04, 2010 3:36 PM  Follow-up for Phone Call        ATC # provided above - [facility] Crystal Yetta Barre RN  March 04, 2010 4:30 PM  Pt was last seen by PW on 03/02/10 -- spoke with pt.  States chest congestion and cough are getting worse since Wednesday.  States cough is prod and "heavy."  The amount of mucus he is coughing up has also increased some.  Mucus is green.  Slight increased SOB and wheezing, chest tightness in left side.  Denies f/c/s.  Requestin abx   [facility] Allergies (verified):  ! * BYSLIN ! CODEINE  PW out of the office -- Dr. Vassie Loll, pls advise.  Thanks!  Follow-up by: Gweneth Dimitri RN,  March 04, 2010 4:54 PM  Additional Follow-up for Phone Call Additional follow up Details #1::        avelox 400 once daily  x 7  If no beter by next week, arrange OV Additional Follow-up by: Comer Locket. Vassie Loll MD,  March 04, 2010 5:12 PM    Additional Follow-up for Phone Call Additional follow up Details #2::    Called, spoke with pt.  he was informed of above recs per RA and verbalized understanding.  [facility]   Follow-up by: Gweneth Dimitri RN,  March 04, 2010 5:15 PM  New/Updated Medications: AVELOX ABC PACK 400 MG TABS (MOXIFLOXACIN HCL) once daily Prescriptions: AVELOX ABC PACK 400 MG TABS (MOXIFLOXACIN HCL) once daily  #7 x 0   Entered and Authorized by:   Comer Locket Vassie Loll MD   Signed by:   Comer Locket Vassie Loll MD on 03/04/2010   Method used:   [facility]        [facility]  [address] [id number]       [address]       [place], [place]  [address]       Ph: [phone]       Fax: [phone]   RxID:   [id number]   Appended Document: sick - requesting abx thanks

## 2010-03-10 NOTE — Progress Notes (Signed)
Summary: CXR results  Phone Note Outgoing Call   Reason for Call: Discuss lab or test results Summary of Call: Pls let the patient know his CXR was normal.  Stay on medications as prescribed today Initial call taken by: Storm Frisk MD,  March 09, 2009 10:39 AM  Follow-up for Phone Call        called, spoke with pt.  Pt informed of above cxr results and recs per PW.  He verbalized understanding.  Follow-up by: Gweneth Dimitri RN,  March 09, 2009 12:34 PM

## 2010-03-10 NOTE — Assessment & Plan Note (Signed)
Summary: Pulmonary OV   Primary Provider/Referring Provider:  [facility]  CC:  3 wk follow up.  states chest congestion started back Sunday night and also coughing up a lot green mucus, increased SOB with activity, and and wheezing since Sunday. denies fever.Marland Kitchen  History of Present Illness: Pulmonary OV: 70 yo WM with COPD Golds Stage III.  Recent admit 9/10 forH Flu CAP and VDRF.  Not seen since in office since  2007.  admitted 9/15-10/1/10 HFlu PNA Cap, septic shock,  vent support The pt presented with resp distress The pt was ill tues prior to admit The pt then noted wed 2am  suddenly sob and low bp and tfr to ED from [facility]. Rx vent/ steroids/ABX .  D/C 11/06/08.    December 25, 2008  Pt presents for follow up on PNA.  Recently admitted from 9/15-10/1 for VDRF/PNA at [facility].  Bronchoscopy performed on 10/28 with normal tissue returned, mucus plugging RML.  Patient reports that he feels better and is continuing to use his IS at home-is able to pull 2500 volumes.  Reports mild shortness of breath this am but relates it to anxiety regarding appointment mix-up today. Minimial non-productive cough and shortness of breath with activity.  At office he was able to walk down to the lab and back up without O2 and reports he tolerated well.   He indicates he is ready to return back to work.  Relates that most of his activity is later in the day at work.  He also complains of burping and reflux symptoms.  Pt denies any significant sore throat, nasal congestion or excess secretions, fever, chills, sweats, unintended weight loss, pleurtic or exertional chest pain, orthopnea PND, or leg swelling   January 08, 2009 11:04 AM Pt is improved with improved oxygenation.  The pt uses oxygen at night and  is off portable system Pt is now back at work Has a sl cold now with am mucous  sl yellow-green Pt had FOB with mucous plugging seen in RML  no CA seen.March 09, 2009 9:24 AM c/o bad cold.  three  days.  Notes some mucous in the am moderate amount. No chest pain.  Sl amount of wheezes.  No real fever. Sinuses are congested.   Working now.  March 24, 2009 3:26 PM The pt was rx wtih avelox and pred pulse. 03/09/09. The pt  got better and then worse again over past weekend.   The pts stomach started cramping as well.  No real chest pain except from coughing.  Notes not as much wheezing.  No real fever.    Preventive Screening-Counseling & Management  Alcohol-Tobacco     Smoking Status: quit > 6 months  Clinical Reports Reviewed:  CXR:  03/09/2009: CXR Results:  Findings: Findings compatible architectural distortion of the medial aspect of the right lung.  No definite active infiltrate. Focal scarring left base as well.  Normal cardiomediastinal silhouette.  COPD.   IMPRESSION: Stable right midlung zone parenchymal density suggesting scarring. No definite acute infiltrate.    11/24/2008: CXR Results:  IMPRESSION: Persistent airspace disease right midlung and right middle lobe without significant change in aeration.  No pulmonary edema.  11/09/2008: CXR Results:  Findings: Persistent air space disease is seen in a right perihilar distribution.  Air space disease does extend into the right upper lobe and right middle lobe.  There are also changes within the superior segment of the right lower lobe. When compared to prior  study, there has been no significant interval change in aeration. There is volume loss noted in this region.  The left lung is clear. The heart is normal in size.   IMPRESSION: Persistent air space disease with volume loss noted as described above.  There has been no significant change in aeration when compared with 11/04/2008.   Current Medications (verified): 1)  Advair Diskus 250-50 Mcg/dose Aepb (Fluticasone-Salmeterol) .Marland Kitchen.. 1 Puff Two Times A Day 2)  Combivent 103-18 Mcg/act Aero (Ipratropium-Albuterol) .... 2 Puffs Every 4 Hours As Needed 3)   Oxygen .... 2liter Rest ;  3 Liter Exertion 4)  Spiriva Handihaler 18 Mcg  Caps (Tiotropium Bromide Monohydrate) .... Two Puffs in Handihaler Daily 5)  Prednisone 10 Mg  Tabs (Prednisone) .... Take As Directed 4 Each Am X 4 Days, 3 X 4 Days, 2 X 4 Days, 1 X 4 Days Then Stop  Allergies (verified): 1)  ! * Byslin 2)  ! Codeine  Past History:  Past medical, surgical, family and social histories (including risk factors) reviewed, and no changes noted (except as noted below).  Past Medical History: Reviewed history from 11/09/2008 and no changes required. COPD (ICD-496) BRONCHITIS (ICD-490) PNEUMONIA (ICD-486)     -Hflu RUL PNA and resp failure 9/15- 11/06/08 Hypertension  Past Surgical History: Reviewed history from 11/09/2008 and no changes required. Tonsillectomy Hernia surgery 1990's  Family History: Reviewed history from 11/09/2008 and no changes required. mother-breast ca  Social History: Reviewed history from 11/09/2008 and no changes required. Patient never smoked.  quit in 2000.  <1ppd x32yrs.   divorced lives alone Works with [employer]  Review of Systems       The patient complains of shortness of breath with activity, shortness of breath at rest, productive cough, non-productive cough, chest pain, acid heartburn, indigestion, and change in color of mucus.  The patient denies coughing up blood, irregular heartbeats, loss of appetite, weight change, abdominal pain, difficulty swallowing, sore throat, tooth/dental problems, headaches, nasal congestion/difficulty breathing through nose, sneezing, itching, ear ache, anxiety, depression, hand/feet swelling, joint stiffness or pain, rash, and fever.    Vital Signs:  Patient profile:   70 year old male Height:      67 inches Weight:      142.50 pounds O2 Sat:      94 % on 2 L/mincont Temp:     98.1 degrees F oral Pulse rate:   109 / minute BP sitting:   114 / 66  (right arm) Cuff size:   regular  Vitals  Entered By: Gweneth Dimitri RN (March 24, 2009 3:10 PM)  O2 Flow:  2 L/mincont CC: 3 wk follow up.  states chest congestion started back Sunday night and also coughing up a lot green mucus, increased SOB with activity, and wheezing since Sunday. denies fever. Comments Medications reviewed with patient Daytime contact number verified with patient. Gweneth Dimitri RN  March 24, 2009 3:10 PM    Physical Exam  Additional Exam:  General: thin, well developed adult in NAD HEENT: [place]/AT.  Mucus membranes pink/moist.  No JVD.  No lymphadenopathy.  Neuro: Awake, alert, oriented.  MAE, speech clear.  EOMI, PERRLA CV: S1S2 RRR, no murmurs/rubs/gallops.  No carotid bruits. Radial pulses +2.   Pulm: Resp's even and non-labored.  Lungs with exp wheezes and rhonchi RLL GI: abdomen round/soft, non-tender to palpation.  BSx4 active. No organomegaly. Skin: no rashes or lesions Ext: warm/dry.  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 By mouth daily His updated medication list for this problem includes:    Advair Diskus 250-50 Mcg/dose Aepb (Fluticasone-salmeterol) .Marland Kitchen... 1 puff two times a day    Combivent 103-18 Mcg/act Aero (Ipratropium-albuterol) .Marland Kitchen... 2 puffs every 4 hours as needed    Spiriva Handihaler 18 Mcg Caps (Tiotropium bromide monohydrate) .Marland Kitchen..Marland Kitchen Two puffs in handihaler daily    Ciprofloxacin Hcl 750 Mg Tabs (Ciprofloxacin hcl) ..... One by mouth two times a day  generic please  Orders: Est. Patient Level IV ([address]) T-Culture, Sputum & Gram Stain (87070/[id number]) Prescription Created Electronically [phone])  Problem # 2:  COPD  (ICD-496) Assessment: Deteriorated  COPD Golds stage III.  Recent VDRF with HFLU CAP.  Now slowly better  plan cont oxygen and inhaled meds  see assessment number one   Medications Added to Medication List This Visit: 1)  Prednisone 10 Mg Tabs (Prednisone) .... Take as directed 4 each am x 4 days, 3 x 4 days, 2 x 4 days, then one a day and stay 2)  Ciprofloxacin Hcl 750 Mg Tabs (Ciprofloxacin hcl) .... One by mouth two times a day  generic please  Complete Medication List: 1)  Advair Diskus 250-50 Mcg/dose Aepb (Fluticasone-salmeterol) .Marland Kitchen.. 1 puff two times a day 2)  Combivent 103-18 Mcg/act Aero (Ipratropium-albuterol) .... 2 puffs every 4 hours as needed 3)  Oxygen  .... 2liter rest ;  3 liter exertion 4)  Spiriva Handihaler 18 Mcg Caps (Tiotropium bromide monohydrate) .... Two puffs in handihaler daily 5)  Prednisone 10 Mg Tabs (Prednisone) .... Take as directed 4 each am x 4 days, 3 x 4 days, 2 x 4 days, then one a day and stay 6)  Ciprofloxacin Hcl 750 Mg Tabs (Ciprofloxacin hcl) .... One by mouth two times a day  generic please  Patient Instructions: 1)  Repulse prednisone 4 each am x 4 days, 3 x 4 days, 2 x 4 days,then one daily and stay 2)  Cipro one twice a day for 7 days 3)  No change in advair or spiriva 4)  Stay on oxygen 5)  Obtain a sputum culture 6)  Return in 3-4 weeks Prescriptions: PREDNISONE 10 MG  TABS (PREDNISONE) Take as directed 4 each am x 4 days, 3 x 4 days, 2 x 4 days, then one a day and stay  #60 x 4   Entered and Authorized by:   Storm Frisk MD   Signed by:   Storm Frisk MD on 03/24/2009   Method used:   [facility]        [facility]  [address] [phone][facility] (retail)       [address]       [place], [place]  [address]       Ph: [phone]       Fax: [phone]   RxID:   [id number] CIPROFLOXACIN HCL 750 MG TABS (CIPROFLOXACIN HCL) One by mouth two times a day  generic please  #14 x 0   Entered and Authorized by:    Storm Frisk MD   Signed by:   Storm Frisk MD on 03/24/2009  Method used:   [facility]        [facility]       [address]       [place], [place]  [address]       Ph: [phone]       Fax: [phone]   RxID:   [id number]

## 2010-03-10 NOTE — Assessment & Plan Note (Signed)
Summary: Pulmonary OV   Primary Provider/Referring Provider:  [facility]  CC:  3-4 week bronchitis follow-up.  COPD.  The patient states his breathing is doing well.  Occasional cough in the mornings.  He is not wearing his oxygen on a regular basis.  He states he gets "light-headed"..  History of Present Illness: Pulmonary OV: 70 yo WM with COPD Golds Stage III.  Recent admit 9/10 forH Flu CAP and VDRF.  Not seen since in office since  2007.  admitted 9/15-10/1/10 HFlu PNA Cap, septic shock,  vent support The pt presented with resp distress The pt was ill tues prior to admit The pt then noted wed 2am  suddenly sob and low bp and tfr to ED from [facility]. Rx vent/ steroids/ABX .  D/C 11/06/08.    December 25, 2008  Pt presents for follow up on PNA.  Recently admitted from 9/15-10/1 for VDRF/PNA at [facility].  Bronchoscopy performed on 10/28 with normal tissue returned, mucus plugging RML.  Patient reports that he feels better and is continuing to use his IS at home-is able to pull 2500 volumes.  Reports mild shortness of breath this am but relates it to anxiety regarding appointment mix-up today. Minimial non-productive cough and shortness of breath with activity.  At office he was able to walk down to the lab and back up without O2 and reports he tolerated well.   He indicates he is ready to return back to work.  Relates that most of his activity is later in the day at work.  He also complains of burping and reflux symptoms.  Pt denies any significant sore throat, nasal congestion or excess secretions, fever, chills, sweats, unintended weight loss, pleurtic or exertional chest pain, orthopnea PND, or leg swelling   January 08, 2009 11:04 AM Pt is improved with improved oxygenation.  The pt uses oxygen at night and  is off portable system Pt is now back at work Has a sl cold now with am mucous  sl yellow-green Pt had FOB with mucous plugging seen in RML  no CA seen.March 09, 2009  9:24 AM c/o bad cold.  three days.  Notes some mucous in the am moderate amount. No chest pain.  Sl amount of wheezes.  No real fever. Sinuses are congested.   Working now.  March 24, 2009 3:26 PM The pt was rx wtih avelox and pred pulse. 03/09/09. The pt  got better and then worse again over past weekend.   The pts stomach started cramping as well.  No real chest pain except from coughing.  Notes not as much wheezing.  No real fever.    April 16, 2009 9:04 AM Doing better vs Feb with ILI.  No cough now.  Occ in am some pndrip, sinus congestion.  No real chest pain.  Does note some heartburn.  Pt denies any significant sore throat, nasal congestion or excess secretions, fever, chills, sweats, unintended weight loss, pleurtic or exertional chest pain, orthopnea PND, or leg swelling Pt denies any increase in rescue therapy over baseline, denies waking up needing it or having any early am or nocturnal exacerbations of coughing/wheezing/or dyspnea. 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 1 Puff Two Times A Day 2)  Combivent 103-18 Mcg/act  Aero (Ipratropium-Albuterol) .... 2 Puffs Every 4 Hours As Needed 3)  Oxygen .... 2liter Rest ;  3 Liter Exertion 4)  Spiriva Handihaler 18 Mcg  Caps (Tiotropium Bromide Monohydrate) .... Two Puffs in Handihaler Daily  Allergies (verified): 1)  ! * Byslin 2)  ! Codeine  Past History:  Past medical, surgical, family and social histories (including risk factors) reviewed, and no changes noted (except as noted below).  Past Medical History: Reviewed history from 11/09/2008 and no changes required. COPD (ICD-496) BRONCHITIS (ICD-490) PNEUMONIA (ICD-486)     -Hflu RUL PNA and resp failure 9/15-  11/06/08 Hypertension  Past Surgical History: Reviewed history from 11/09/2008 and no changes required. Tonsillectomy Hernia surgery 1990's  Family History: Reviewed history from 11/09/2008 and no changes required. mother-breast ca  Social History: Reviewed history from 11/09/2008 and no changes required. Patient never smoked.  quit in 2000.  <1ppd x42yrs.   divorced lives alone Works with [employer]  Review of Systems       The patient complains of non-productive cough, acid heartburn, indigestion, and nasal congestion/difficulty breathing through nose.  The patient denies shortness of breath with activity, shortness of breath at rest, productive cough, coughing up blood, chest pain, irregular heartbeats, loss of appetite, weight change, abdominal pain, difficulty swallowing, sore throat, tooth/dental problems, headaches, sneezing, itching, ear ache, anxiety, depression, hand/feet swelling, joint stiffness or pain, rash, change in color of mucus, and fever.    Vital Signs:  Patient profile:   70 year old male Height:      67 inches (170.18 cm) Weight:      147 pounds (66.82 kg) BMI:     23.11 O2 Sat:      90 % on Room air Temp:     98.0 degrees F (36.67 degrees C) oral Pulse rate:   111 / minute BP sitting:   120 / 80  (right arm) Cuff size:   regular  Vitals Entered By: Michel Bickers CMA (April 16, 2009 9:02 AM)  O2 Sat at Rest %:  90 O2 Flow:  Room air CC: 3-4 week bronchitis follow-up.  COPD.  The patient states his breathing is doing well.  Occasional cough in the mornings.  He is not wearing his oxygen on a regular basis.  He states he gets "light-headed". Comments Medications reviewed with the patient. Phone number verified. Michel Bickers CMA  April 16, 2009 9:03 AM   Physical Exam  Additional Exam:  General: thin, well developed adult in NAD HEENT: [place]/AT.  Mucus membranes pink/moist.  No JVD.  No lymphadenopathy.  Neuro: Awake, alert, oriented.  MAE,  speech clear.  EOMI, PERRLA CV: S1S2 RRR, no murmurs/rubs/gallops.  No carotid bruits. Radial pulses +2.   Pulm: Resp's even and non-labored.  Lungs with distant BS,  no wheeze or rales or rhonchi GI: abdomen round/soft, non-tender to palpation.  BSx4 active. No organomegaly. Skin: no rashes or lesions Ext: warm/dry.  No edema      Impression & Recommendations:  Problem # 1:  COPD (ICD-496) Assessment Improved  COPD Golds stage III.  Recent AB flare now better  plan cont oxygen qhs and prn day time  and inhaled meds   Medications Added to Medication List This Visit: 1)  Oxygen  .... 2liter at bedtime , as needed with exertion  Complete Medication List: 1)  Advair Diskus 250-50 Mcg/dose Aepb (Fluticasone-salmeterol) .Marland Kitchen.. 1 puff two times a day 2)  Combivent 103-18 Mcg/act Aero (Ipratropium-albuterol) .... 2 puffs every  4 hours as needed 3)  Oxygen  .... 2liter at bedtime , as needed with exertion 4)  Spiriva Handihaler 18 Mcg Caps (Tiotropium bromide monohydrate) .... Two puffs in handihaler daily  Other Orders: Est. Patient Level III ([address])  Patient Instructions: 1)  May be on oxygen 2Liter night time and as needed during the day 2)  No change in medications 3)  Return 4 months

## 2010-03-10 NOTE — Progress Notes (Signed)
Summary: talk to dr Delford Field-  Phone Note Call from Patient Call back at [phone]   Caller: [employer] ( donna burnett) Call For: Jakolby Sedivy Summary of Call: calling to see if pt is able to work without 02 during the day fax is [phone] Initial call taken by: Rickard Patience,  April 20, 2009 11:48 AM  Follow-up for Phone Call        [facility]. Carron Curie CMA  April 20, 2009 11:55 AM  LMOVM TCB.  per letter typed by [facility] pt is to wear o2 at bedtime and as needed throughout the day. Boone Master CNA  April 21, 2009 2:04 PM   Additional Follow-up for Phone Call Additional follow up Details #1::        Spoke with Lupita Leash.  She states that the letter dictated will not allow this pt to keep working.  If possible, she needs the letter to state that pt only needs o2 at bedtime.  She states that given the nature of his job, he can not just go to his truck and get the o2 if needed.  She states that if this is possible please do another letter.  If not, they will have to let pt go.  Please advise thanks Additional Follow-up by: Vernie Murders,  April 21, 2009 2:15 PM    Additional Follow-up for Phone Call Additional follow up Details #2::    he needs the o2 tank as needed ,  I will likely have to retire,  I cannot change the lettter  Follow-up by: Storm Frisk MD,  April 21, 2009 9:16 PM  Additional Follow-up for Phone Call Additional follow up Details #3:: Details for Additional Follow-up Action Taken: advised donna at [employer]. I also advised the patietn as well.  Carron Curie CMA  April 22, 2009 10:50 AM

## 2010-03-10 NOTE — Progress Notes (Signed)
Summary: sputum culture result  Phone Note Outgoing Call   Reason for Call: Discuss lab or test results Summary of Call: call pt and tell his sputum culture was normal,  finish cipro as rx  Initial call taken by: Storm Frisk MD,  March 26, 2009 9:45 AM  Follow-up for Phone Call        called, spoke with pt.  Pt informed sputum cx normal and to finish cipro as rx per PW.  He verbalized understanding and had no questions.   Follow-up by: Gweneth Dimitri RN,  March 26, 2009 10:06 AM

## 2010-04-18 LAB — BASIC METABOLIC PANEL
BUN: 8 mg/dL (ref 6–23)
CO2: 27 mEq/L (ref 19–32)
Calcium: 8.3 mg/dL — ABNORMAL LOW (ref 8.4–10.5)
Chloride: 103 mEq/L (ref 96–112)
Creatinine, Ser: 0.77 mg/dL (ref 0.4–1.5)
GFR calc Af Amer: >60 mL/min (ref >60)

## 2010-04-18 LAB — POCT CARDIAC MARKERS
CKMB, poc: <1 ng/mL — ABNORMAL LOW (ref 1.0–8.0)
Troponin i, poc: <0.05 ng/mL (ref 0.00–0.09)

## 2010-04-18 LAB — BLOOD GAS, ARTERIAL
Acid-base deficit: 2 mmol/L (ref 0.0–2.0)
Bicarbonate: 22 mEq/L (ref 20.0–24.0)
Drawn by: 331761
O2 Saturation: 91.4 %
pCO2 arterial: 35.8 mmHg (ref 35.0–45.0)
pO2, Arterial: 58.4 mmHg — ABNORMAL LOW (ref 80.0–100.0)

## 2010-04-18 LAB — CULTURE, BLOOD (ROUTINE X 2)
Culture  Setup Time: 201112191031
Culture: NO GROWTH
Culture: NO GROWTH

## 2010-04-18 LAB — CBC
HCT: 33.4 % — ABNORMAL LOW (ref 39.0–52.0)
Hemoglobin: 11.7 g/dL — ABNORMAL LOW (ref 13.0–17.0)
MCH: 29.5 pg (ref 26.0–34.0)
MCH: 29.6 pg (ref 26.0–34.0)
MCH: 29.6 pg (ref 26.0–34.0)
MCHC: 31.4 g/dL (ref 30.0–36.0)
MCHC: 31.7 g/dL (ref 30.0–36.0)
MCHC: 31.8 g/dL (ref 30.0–36.0)
MCV: 93.3 fL (ref 78.0–100.0)
MCV: 94.3 fL (ref 78.0–100.0)
Platelets: 230 10*3/uL (ref 150–400)
Platelets: 249 10*3/uL (ref 150–400)
Platelets: 268 10*3/uL (ref 150–400)
Platelets: 272 10*3/uL (ref 150–400)
RBC: 3.53 MIL/uL — ABNORMAL LOW (ref 4.22–5.81)
RDW: 14.1 % (ref 11.5–15.5)
RDW: 14.7 % (ref 11.5–15.5)
RDW: 14.8 % (ref 11.5–15.5)
RDW: 15 % (ref 11.5–15.5)
WBC: 14.7 10*3/uL — ABNORMAL HIGH (ref 4.0–10.5)

## 2010-04-18 LAB — COMPREHENSIVE METABOLIC PANEL
ALT: 15 U/L (ref 0–53)
AST: 22 U/L (ref 0–37)
Albumin: 2.9 g/dL — ABNORMAL LOW (ref 3.5–5.2)
Calcium: 8.5 mg/dL (ref 8.4–10.5)
Creatinine, Ser: 0.85 mg/dL (ref 0.4–1.5)
GFR calc Af Amer: >60 mL/min (ref >60)
GFR calc non Af Amer: >60 mL/min (ref >60)
Sodium: 137 mEq/L (ref 135–145)
Total Protein: 5.7 g/dL — ABNORMAL LOW (ref 6.0–8.3)

## 2010-04-18 LAB — POCT I-STAT 3, ART BLOOD GAS (G3+)
Acid-base deficit: 4 mmol/L — ABNORMAL HIGH (ref 0.0–2.0)
Bicarbonate: 22.5 mEq/L (ref 20.0–24.0)
O2 Saturation: 93 %
Patient temperature: 98.6

## 2010-04-18 LAB — POCT I-STAT, CHEM 8
Calcium, Ion: 1.06 mmol/L — ABNORMAL LOW (ref 1.12–1.32)
Chloride: 108 mEq/L (ref 96–112)
Creatinine, Ser: 1 mg/dL (ref 0.4–1.5)
Glucose, Bld: 145 mg/dL — ABNORMAL HIGH (ref 70–99)
HCT: 46 % (ref 39.0–52.0)

## 2010-04-18 LAB — DIFFERENTIAL
Basophils Absolute: 0 10*3/uL (ref 0.0–0.1)
Basophils Relative: 0 % (ref 0–1)
Eosinophils Absolute: 0 10*3/uL (ref 0.0–0.7)
Eosinophils Relative: 0 % (ref 0–5)
Eosinophils Relative: 0 % (ref 0–5)
Lymphs Abs: 0.5 10*3/uL — ABNORMAL LOW (ref 0.7–4.0)
Monocytes Absolute: 1.6 10*3/uL — ABNORMAL HIGH (ref 0.1–1.0)
Monocytes Relative: 6 % (ref 3–12)
WBC Morphology: INCREASED

## 2010-04-18 LAB — LACTIC ACID, PLASMA
Lactic Acid, Venous: 2.4 mmol/L — ABNORMAL HIGH (ref 0.5–2.2)
Lactic Acid, Venous: 3.7 mmol/L — ABNORMAL HIGH (ref 0.5–2.2)

## 2010-04-18 LAB — LEGIONELLA ANTIGEN, URINE: Legionella Antigen, Urine: NEGATIVE

## 2010-04-18 LAB — BRAIN NATRIURETIC PEPTIDE: Pro B Natriuretic peptide (BNP): 51 pg/mL (ref 0.0–100.0)

## 2010-04-18 LAB — PROCALCITONIN: Procalcitonin: 24.11 ng/mL

## 2010-04-18 LAB — STREP PNEUMONIAE URINARY ANTIGEN: Strep Pneumo Urinary Antigen: NEGATIVE

## 2010-04-18 LAB — MRSA PCR SCREENING: MRSA by PCR: NEGATIVE

## 2010-05-12 LAB — CULTURE, RESPIRATORY W GRAM STAIN: Special Requests: ABNORMAL

## 2010-05-12 LAB — FUNGUS CULTURE W SMEAR
Fungal Smear: NONE SEEN
Special Requests: ABNORMAL

## 2010-05-12 LAB — CBC
Hemoglobin: 10.3 g/dL — ABNORMAL LOW (ref 13.0–17.0)
RBC: 3.42 MIL/uL — ABNORMAL LOW (ref 4.22–5.81)
WBC: 10.8 10*3/uL — ABNORMAL HIGH (ref 4.0–10.5)

## 2010-05-12 LAB — GLUCOSE, CAPILLARY
Glucose-Capillary: 119 mg/dL — ABNORMAL HIGH (ref 70–99)
Glucose-Capillary: 99 mg/dL (ref 70–99)

## 2010-05-12 LAB — AFB CULTURE WITH SMEAR (NOT AT ARMC): Acid Fast Smear: NONE SEEN

## 2010-05-13 LAB — D-DIMER, QUANTITATIVE: D-Dimer, Quant: 0.74 ug/mL-FEU — ABNORMAL HIGH (ref 0.00–0.48)

## 2010-05-13 LAB — RENAL FUNCTION PANEL
Albumin: 2.1 g/dL — ABNORMAL LOW (ref 3.5–5.2)
CO2: 24 mEq/L (ref 19–32)
Calcium: 7.9 mg/dL — ABNORMAL LOW (ref 8.4–10.5)
Chloride: 105 mEq/L (ref 96–112)
Chloride: 95 mEq/L — ABNORMAL LOW (ref 96–112)
Creatinine, Ser: 0.66 mg/dL (ref 0.4–1.5)
GFR calc Af Amer: >60 mL/min (ref >60)
GFR calc non Af Amer: >60 mL/min (ref >60)
Glucose, Bld: 104 mg/dL — ABNORMAL HIGH (ref 70–99)
Phosphorus: <1 mg/dL — CL (ref 2.3–4.6)
Potassium: 3.7 mEq/L (ref 3.5–5.1)
Sodium: 138 mEq/L (ref 135–145)

## 2010-05-13 LAB — GLUCOSE, CAPILLARY
Glucose-Capillary: 100 mg/dL — ABNORMAL HIGH (ref 70–99)
Glucose-Capillary: 100 mg/dL — ABNORMAL HIGH (ref 70–99)
Glucose-Capillary: 100 mg/dL — ABNORMAL HIGH (ref 70–99)
Glucose-Capillary: 101 mg/dL — ABNORMAL HIGH (ref 70–99)
Glucose-Capillary: 102 mg/dL — ABNORMAL HIGH (ref 70–99)
Glucose-Capillary: 102 mg/dL — ABNORMAL HIGH (ref 70–99)
Glucose-Capillary: 102 mg/dL — ABNORMAL HIGH (ref 70–99)
Glucose-Capillary: 103 mg/dL — ABNORMAL HIGH (ref 70–99)
Glucose-Capillary: 103 mg/dL — ABNORMAL HIGH (ref 70–99)
Glucose-Capillary: 105 mg/dL — ABNORMAL HIGH (ref 70–99)
Glucose-Capillary: 105 mg/dL — ABNORMAL HIGH (ref 70–99)
Glucose-Capillary: 106 mg/dL — ABNORMAL HIGH (ref 70–99)
Glucose-Capillary: 106 mg/dL — ABNORMAL HIGH (ref 70–99)
Glucose-Capillary: 106 mg/dL — ABNORMAL HIGH (ref 70–99)
Glucose-Capillary: 108 mg/dL — ABNORMAL HIGH (ref 70–99)
Glucose-Capillary: 109 mg/dL — ABNORMAL HIGH (ref 70–99)
Glucose-Capillary: 110 mg/dL — ABNORMAL HIGH (ref 70–99)
Glucose-Capillary: 111 mg/dL — ABNORMAL HIGH (ref 70–99)
Glucose-Capillary: 111 mg/dL — ABNORMAL HIGH (ref 70–99)
Glucose-Capillary: 112 mg/dL — ABNORMAL HIGH (ref 70–99)
Glucose-Capillary: 113 mg/dL — ABNORMAL HIGH (ref 70–99)
Glucose-Capillary: 113 mg/dL — ABNORMAL HIGH (ref 70–99)
Glucose-Capillary: 114 mg/dL — ABNORMAL HIGH (ref 70–99)
Glucose-Capillary: 116 mg/dL — ABNORMAL HIGH (ref 70–99)
Glucose-Capillary: 116 mg/dL — ABNORMAL HIGH (ref 70–99)
Glucose-Capillary: 117 mg/dL — ABNORMAL HIGH (ref 70–99)
Glucose-Capillary: 121 mg/dL — ABNORMAL HIGH (ref 70–99)
Glucose-Capillary: 123 mg/dL — ABNORMAL HIGH (ref 70–99)
Glucose-Capillary: 126 mg/dL — ABNORMAL HIGH (ref 70–99)
Glucose-Capillary: 128 mg/dL — ABNORMAL HIGH (ref 70–99)
Glucose-Capillary: 132 mg/dL — ABNORMAL HIGH (ref 70–99)
Glucose-Capillary: 143 mg/dL — ABNORMAL HIGH (ref 70–99)
Glucose-Capillary: 153 mg/dL — ABNORMAL HIGH (ref 70–99)
Glucose-Capillary: 419 mg/dL — ABNORMAL HIGH (ref 70–99)
Glucose-Capillary: 91 mg/dL (ref 70–99)
Glucose-Capillary: 92 mg/dL (ref 70–99)
Glucose-Capillary: 95 mg/dL (ref 70–99)
Glucose-Capillary: 96 mg/dL (ref 70–99)
Glucose-Capillary: 97 mg/dL (ref 70–99)
Glucose-Capillary: 97 mg/dL (ref 70–99)
Glucose-Capillary: 99 mg/dL (ref 70–99)
Glucose-Capillary: 142 mg/dL — ABNORMAL HIGH (ref 70–99)

## 2010-05-13 LAB — POCT I-STAT 3, ART BLOOD GAS (G3+)
Acid-base deficit: 10 mmol/L — ABNORMAL HIGH (ref 0.0–2.0)
Acid-base deficit: 5 mmol/L — ABNORMAL HIGH (ref 0.0–2.0)
Bicarbonate: 19.1 mEq/L — ABNORMAL LOW (ref 20.0–24.0)
Bicarbonate: 21.2 mEq/L (ref 20.0–24.0)
O2 Saturation: 92 %
O2 Saturation: 88 %
O2 Saturation: 93 %
O2 Saturation: 95 %
O2 Saturation: 99 %
Patient temperature: 99.4
Patient temperature: 99.1
TCO2: 20 mmol/L (ref 0–100)
TCO2: 20 mmol/L (ref 0–100)
pCO2 arterial: 33.3 mmHg — ABNORMAL LOW (ref 35.0–45.0)
pCO2 arterial: 34.1 mmHg — ABNORMAL LOW (ref 35.0–45.0)
pCO2 arterial: 40.8 mmHg (ref 35.0–45.0)
pCO2 arterial: 48 mmHg — ABNORMAL HIGH (ref 35.0–45.0)
pH, Arterial: 7.373 (ref 7.350–7.450)
pH, Arterial: 7.295 — ABNORMAL LOW (ref 7.350–7.450)
pO2, Arterial: 175 mmHg — ABNORMAL HIGH (ref 80.0–100.0)
pO2, Arterial: 65 mmHg — ABNORMAL LOW (ref 80.0–100.0)
pO2, Arterial: 82 mmHg (ref 80.0–100.0)
pO2, Arterial: 85 mmHg (ref 80.0–100.0)

## 2010-05-13 LAB — CLOSTRIDIUM DIFFICILE EIA: C difficile Toxins A+B, EIA: NEGATIVE

## 2010-05-13 LAB — URINALYSIS, ROUTINE W REFLEX MICROSCOPIC
Glucose, UA: NEGATIVE mg/dL
Ketones, ur: NEGATIVE mg/dL
Leukocytes, UA: NEGATIVE
Nitrite: NEGATIVE
Specific Gravity, Urine: 1.027 (ref 1.005–1.030)
pH: 5.5 (ref 5.0–8.0)

## 2010-05-13 LAB — CARBOXYHEMOGLOBIN: Carboxyhemoglobin: 0.9 % (ref 0.5–1.5)

## 2010-05-13 LAB — CBC
HCT: 28.1 % — ABNORMAL LOW (ref 39.0–52.0)
HCT: 28.6 % — ABNORMAL LOW (ref 39.0–52.0)
HCT: 29 % — ABNORMAL LOW (ref 39.0–52.0)
HCT: 29.4 % — ABNORMAL LOW (ref 39.0–52.0)
HCT: 31 % — ABNORMAL LOW (ref 39.0–52.0)
HCT: 31.1 % — ABNORMAL LOW (ref 39.0–52.0)
HCT: 32.1 % — ABNORMAL LOW (ref 39.0–52.0)
HCT: 32.6 % — ABNORMAL LOW (ref 39.0–52.0)
HCT: 40 % (ref 39.0–52.0)
Hemoglobin: 10 g/dL — ABNORMAL LOW (ref 13.0–17.0)
Hemoglobin: 10.1 g/dL — ABNORMAL LOW (ref 13.0–17.0)
Hemoglobin: 10.2 g/dL — ABNORMAL LOW (ref 13.0–17.0)
Hemoglobin: 10.3 g/dL — ABNORMAL LOW (ref 13.0–17.0)
Hemoglobin: 10.9 g/dL — ABNORMAL LOW (ref 13.0–17.0)
Hemoglobin: 9.4 g/dL — ABNORMAL LOW (ref 13.0–17.0)
Hemoglobin: 9.4 g/dL — ABNORMAL LOW (ref 13.0–17.0)
Hemoglobin: 9.7 g/dL — ABNORMAL LOW (ref 13.0–17.0)
Hemoglobin: 9.7 g/dL — ABNORMAL LOW (ref 13.0–17.0)
MCHC: 32.5 g/dL (ref 30.0–36.0)
MCHC: 33 g/dL (ref 30.0–36.0)
MCHC: 33.1 g/dL (ref 30.0–36.0)
MCHC: 33.3 g/dL (ref 30.0–36.0)
MCV: 90.3 fL (ref 78.0–100.0)
MCV: 90.6 fL (ref 78.0–100.0)
MCV: 90.6 fL (ref 78.0–100.0)
MCV: 90.8 fL (ref 78.0–100.0)
MCV: 91.4 fL (ref 78.0–100.0)
MCV: 91.8 fL (ref 78.0–100.0)
MCV: 92 fL (ref 78.0–100.0)
MCV: 91.3 fL (ref 78.0–100.0)
Platelets: 197 10*3/uL (ref 150–400)
Platelets: 311 10*3/uL (ref 150–400)
Platelets: 384 10*3/uL (ref 150–400)
Platelets: 453 10*3/uL — ABNORMAL HIGH (ref 150–400)
Platelets: 478 10*3/uL — ABNORMAL HIGH (ref 150–400)
Platelets: 590 10*3/uL — ABNORMAL HIGH (ref 150–400)
Platelets: 243 10*3/uL (ref 150–400)
RBC: 3.09 MIL/uL — ABNORMAL LOW (ref 4.22–5.81)
RBC: 3.1 MIL/uL — ABNORMAL LOW (ref 4.22–5.81)
RBC: 3.14 MIL/uL — ABNORMAL LOW (ref 4.22–5.81)
RBC: 3.15 MIL/uL — ABNORMAL LOW (ref 4.22–5.81)
RBC: 3.29 MIL/uL — ABNORMAL LOW (ref 4.22–5.81)
RBC: 3.32 MIL/uL — ABNORMAL LOW (ref 4.22–5.81)
RBC: 3.4 MIL/uL — ABNORMAL LOW (ref 4.22–5.81)
RBC: 3.41 MIL/uL — ABNORMAL LOW (ref 4.22–5.81)
RBC: 3.54 MIL/uL — ABNORMAL LOW (ref 4.22–5.81)
RDW: 15.6 % — ABNORMAL HIGH (ref 11.5–15.5)
RDW: 15.8 % — ABNORMAL HIGH (ref 11.5–15.5)
RDW: 15.9 % — ABNORMAL HIGH (ref 11.5–15.5)
RDW: 16 % — ABNORMAL HIGH (ref 11.5–15.5)
RDW: 16 % — ABNORMAL HIGH (ref 11.5–15.5)
RDW: 15.2 % (ref 11.5–15.5)
WBC: 11.9 10*3/uL — ABNORMAL HIGH (ref 4.0–10.5)
WBC: 13 10*3/uL — ABNORMAL HIGH (ref 4.0–10.5)
WBC: 14.2 10*3/uL — ABNORMAL HIGH (ref 4.0–10.5)
WBC: 14.3 10*3/uL — ABNORMAL HIGH (ref 4.0–10.5)
WBC: 14.8 10*3/uL — ABNORMAL HIGH (ref 4.0–10.5)
WBC: 14.9 10*3/uL — ABNORMAL HIGH (ref 4.0–10.5)
WBC: 16.4 10*3/uL — ABNORMAL HIGH (ref 4.0–10.5)
WBC: 17.1 10*3/uL — ABNORMAL HIGH (ref 4.0–10.5)
WBC: 18.1 10*3/uL — ABNORMAL HIGH (ref 4.0–10.5)
WBC: 18.4 10*3/uL — ABNORMAL HIGH (ref 4.0–10.5)

## 2010-05-13 LAB — TYPE AND SCREEN
ABO/RH(D): O POS
Antibody Screen: NEGATIVE

## 2010-05-13 LAB — CULTURE, BLOOD (ROUTINE X 2): Culture: NO GROWTH

## 2010-05-13 LAB — BASIC METABOLIC PANEL
BUN: 10 mg/dL (ref 6–23)
BUN: 8 mg/dL (ref 6–23)
CO2: 27 mEq/L (ref 19–32)
Calcium: 7.7 mg/dL — ABNORMAL LOW (ref 8.4–10.5)
Calcium: 7.9 mg/dL — ABNORMAL LOW (ref 8.4–10.5)
Calcium: 8.7 mg/dL (ref 8.4–10.5)
Chloride: 102 mEq/L (ref 96–112)
Chloride: 99 mEq/L (ref 96–112)
Creatinine, Ser: 0.68 mg/dL (ref 0.4–1.5)
Creatinine, Ser: 0.72 mg/dL (ref 0.4–1.5)
GFR calc Af Amer: >60 mL/min (ref >60)
GFR calc Af Amer: >60 mL/min (ref >60)
GFR calc non Af Amer: >60 mL/min (ref >60)
GFR calc non Af Amer: >60 mL/min (ref >60)
GFR calc non Af Amer: >60 mL/min (ref >60)
Glucose, Bld: 156 mg/dL — ABNORMAL HIGH (ref 70–99)
Potassium: 2.9 mEq/L — ABNORMAL LOW (ref 3.5–5.1)
Potassium: 3.9 mEq/L (ref 3.5–5.1)
Potassium: 4.4 mEq/L (ref 3.5–5.1)
Sodium: 139 mEq/L (ref 135–145)
Sodium: 140 mEq/L (ref 135–145)

## 2010-05-13 LAB — APTT: aPTT: 31 seconds (ref 24–37)

## 2010-05-13 LAB — DIFFERENTIAL
Basophils Absolute: 0 10*3/uL (ref 0.0–0.1)
Basophils Absolute: 0 10*3/uL (ref 0.0–0.1)
Basophils Absolute: 0 10*3/uL (ref 0.0–0.1)
Basophils Relative: 0 % (ref 0–1)
Eosinophils Absolute: 0.1 10*3/uL (ref 0.0–0.7)
Eosinophils Absolute: 0.2 10*3/uL (ref 0.0–0.7)
Eosinophils Absolute: 0 10*3/uL (ref 0.0–0.7)
Eosinophils Relative: 1 % (ref 0–5)
Lymphs Abs: 1 10*3/uL (ref 0.7–4.0)
Lymphs Abs: 0.5 10*3/uL — ABNORMAL LOW (ref 0.7–4.0)
Monocytes Absolute: 1.2 10*3/uL — ABNORMAL HIGH (ref 0.1–1.0)
Monocytes Absolute: 0.8 10*3/uL (ref 0.1–1.0)
Monocytes Relative: 6 % (ref 3–12)
Neutro Abs: 14.7 10*3/uL — ABNORMAL HIGH (ref 1.7–7.7)
Neutro Abs: 13.7 10*3/uL — ABNORMAL HIGH (ref 1.7–7.7)
Neutrophils Relative %: 86 % — ABNORMAL HIGH (ref 43–77)

## 2010-05-13 LAB — COMPREHENSIVE METABOLIC PANEL
ALT: 12 U/L (ref 0–53)
ALT: 17 U/L (ref 0–53)
AST: 32 U/L (ref 0–37)
Alkaline Phosphatase: 39 U/L (ref 39–117)
Alkaline Phosphatase: 50 U/L (ref 39–117)
BUN: 25 mg/dL — ABNORMAL HIGH (ref 6–23)
CO2: 22 mEq/L (ref 19–32)
Calcium: 8.5 mg/dL (ref 8.4–10.5)
Chloride: 100 mEq/L (ref 96–112)
GFR calc Af Amer: 49 mL/min — ABNORMAL LOW (ref >60)
GFR calc non Af Amer: 40 mL/min — ABNORMAL LOW (ref >60)
Glucose, Bld: 157 mg/dL — ABNORMAL HIGH (ref 70–99)
Glucose, Bld: 129 mg/dL — ABNORMAL HIGH (ref 70–99)
Potassium: 3.9 mEq/L (ref 3.5–5.1)
Potassium: 4 mEq/L (ref 3.5–5.1)
Sodium: 131 mEq/L — ABNORMAL LOW (ref 135–145)
Sodium: 138 mEq/L (ref 135–145)
Total Bilirubin: 0.4 mg/dL (ref 0.3–1.2)
Total Protein: 5.3 g/dL — ABNORMAL LOW (ref 6.0–8.3)
Total Protein: 6.2 g/dL (ref 6.0–8.3)

## 2010-05-13 LAB — URINALYSIS, MICROSCOPIC ONLY
Bilirubin Urine: NEGATIVE
Ketones, ur: NEGATIVE mg/dL
Leukocytes, UA: NEGATIVE
Nitrite: NEGATIVE
Nitrite: NEGATIVE
Protein, ur: NEGATIVE mg/dL
Specific Gravity, Urine: 1.014 (ref 1.005–1.030)
Urobilinogen, UA: 1 mg/dL (ref 0.0–1.0)
pH: 7 (ref 5.0–8.0)

## 2010-05-13 LAB — EXPECTORATED SPUTUM ASSESSMENT W GRAM STAIN, RFLX TO RESP C

## 2010-05-13 LAB — ABO/RH: ABO/RH(D): O POS

## 2010-05-13 LAB — MRSA PCR SCREENING: MRSA by PCR: NEGATIVE

## 2010-05-13 LAB — CULTURE, RESPIRATORY W GRAM STAIN

## 2010-05-13 LAB — CULTURE, BAL-QUANTITATIVE W GRAM STAIN

## 2010-05-13 LAB — HEPATIC FUNCTION PANEL
ALT: 12 U/L (ref 0–53)
ALT: 15 U/L (ref 0–53)
AST: 17 U/L (ref 0–37)
Albumin: 2.1 g/dL — ABNORMAL LOW (ref 3.5–5.2)
Albumin: 2.2 g/dL — ABNORMAL LOW (ref 3.5–5.2)
Alkaline Phosphatase: 37 U/L — ABNORMAL LOW (ref 39–117)
Alkaline Phosphatase: 61 U/L (ref 39–117)
Total Bilirubin: 0.4 mg/dL (ref 0.3–1.2)
Total Protein: 5 g/dL — ABNORMAL LOW (ref 6.0–8.3)
Total Protein: 5.1 g/dL — ABNORMAL LOW (ref 6.0–8.3)

## 2010-05-13 LAB — URINE MICROSCOPIC-ADD ON

## 2010-05-13 LAB — MAGNESIUM
Magnesium: 2 mg/dL (ref 1.5–2.5)
Magnesium: 2.2 mg/dL (ref 1.5–2.5)
Magnesium: 1.6 mg/dL (ref 1.5–2.5)

## 2010-05-13 LAB — CARDIAC PANEL(CRET KIN+CKTOT+MB+TROPI): CK, MB: 1.3 ng/mL (ref 0.3–4.0)

## 2010-05-13 LAB — POCT CARDIAC MARKERS: Troponin i, poc: <0.05 ng/mL (ref 0.00–0.09)

## 2010-05-13 LAB — PHOSPHORUS
Phosphorus: 1.8 mg/dL — ABNORMAL LOW (ref 2.3–4.6)
Phosphorus: 2.5 mg/dL (ref 2.3–4.6)

## 2010-05-13 LAB — IRON AND TIBC
Saturation Ratios: 8 % — ABNORMAL LOW (ref 20–55)
UIBC: 197 ug/dL

## 2010-05-13 LAB — CORTISOL: Cortisol, Plasma: 39 ug/dL

## 2010-05-13 LAB — URINE CULTURE

## 2010-05-13 LAB — FOLATE: Folate: 8.9 ng/mL

## 2010-05-13 LAB — VANCOMYCIN, TROUGH: Vancomycin Tr: 9.1 ug/mL — ABNORMAL LOW (ref 10.0–20.0)

## 2010-05-13 LAB — VITAMIN B12: Vitamin B-12: 1299 pg/mL — ABNORMAL HIGH (ref 211–911)

## 2010-05-13 LAB — LACTIC ACID, PLASMA: Lactic Acid, Venous: 4.8 mmol/L — ABNORMAL HIGH (ref 0.5–2.2)

## 2010-06-24 NOTE — Assessment & Plan Note (Signed)
[facility]                               PULMONARY OFFICE NOTE   LYON, DUMONT                        MRN:          [id number]  DATE:08/31/2005                            DOB:          03/05/1940    Mr. [name] [name] returns today in follow up.  70 year old white male history of  chronic obstructive lung disease, asthmatic bronchitic component.  Patient  is now an ex-smoker since March of 2007.  Maintaining Advair 250/50 1 spray  b.i.d.  Patient is doing well with minimal shortness of  breath and cough.   EXAMINATION:  VITAL SIGNS:  Temperature 98.  Blood pressure 130/70.  Pulse  102.  Saturation 90% room air.  CHEST:  Distant breath sounds with prolonged inspiratory phase.  No wheeze  or rhonchi noted.  CARDIAC:  Regular rate and rhythm without S3.  Normal S1, S2.  ABDOMEN:  Soft, non-tender.  EXTREMITIES:  Showed no edema or clubbing.  SKIN:  Clear.  NEUROLOGIC:  Exam was intact.  HEENT:  No jugular venous distension, lymphadenopathy, oropharynx clear.  NECK:  Supple.   IMPRESSION:  That of chronic obstructive lung disease with asthmatic  bronchitic component.   PLAN:  1.  For the patient to maintain Advair as currently dosed.  2.  We will see the patient back in return follow up.                                   Charlcie Cradle Delford Field, MD, FCCP   PEW/MedQ  DD:  08/31/2005  DT:  08/31/2005  Job #:  [id number]

## 2010-06-24 NOTE — Assessment & Plan Note (Signed)
[facility]                               PULMONARY OFFICE NOTE   NAME:[name] [name]                        MRN:          [id number]  DATE:10/27/2005                            DOB:          [birth year]    HISTORY OF PRESENT ILLNESS:  The patient is a 70 year old white male patient  of [name] [name], with a known history of COPD and asthmatic bronchitis.  Presents with a 2 week history of productive cough with thick mucus,  however, difficult to get up.  Patient complains of sore throat and  wheezing.  Patient denies any hemoptysis, chest pain, orthopnea, PND or leg  swelling.   CURRENT MEDICATIONS:  Patient is maintained on Advair 250/50 twice daily.   PHYSICAL EXAM:  Patient is a thin male in no acute distress.  He is afebrile  with stable vital signs.  The O2 saturation is 93% on room air.  HEENT:  Head is unremarkable.  NECK:  Supple without adenopathy.  No JVD.  LUNG SOUNDS:  Reveal diminished breath sounds in the bases with a few  expiratory wheezes.  CARDIAC:  Regular rate and rhythm.  ABDOMEN:  Soft __________.  EXTREMITIES:  Warm without any edema.   IMPRESSION AND PLAN:  Acute exacerbation of chronic obstructive pulmonary  disease.  The patient is to begin Omnicef times seven days.  Mucinex DM  twice a day.  __________ as needed for cough, 8 ounces, with 1-2 teaspoons  every 4-6 hours as needed.  Patient is aware of sedating effect.  Patient is  to return if no improvement or if symptoms worsen.  Also, patient was given  a Xopenex nebulizer treatment in the office.                                   [name], NP                                [name] [place] Delford Field, MD, FCCP   TP/MedQ  DD:  10/27/2005  DT:  10/29/2005  Job #:  [id number]

## 2010-06-24 NOTE — H&P (Signed)
[name] [name], [name] [name]               ACCOUNT NO.:  [id number]   MEDICAL RECORD NO.:  [id number]          PATIENT TYPE:  EMS   LOCATION:  MAJO                         FACILITY:  [facility]   PHYSICIAN:  Danae Chen, M.D.DATE OF BIRTH:  March 28, 1940   DATE OF ADMISSION:  12/16/2003  DATE OF DISCHARGE:                                HISTORY & PHYSICAL   The patient is a primary unassigned patient.   CHIEF COMPLAINT:  Cough and shortness of breath.   HISTORY OF PRESENT ILLNESS:  The patient is a 70 year old white male with a  history of COPD who presents with a one week history of cough, progressive  shortness of breath, and fatigue that has worsened over the last 24 hours.  He denies any acute chest pain, but has had some dull left-sided chest  discomfort with deep inspirations and cough.  He has had no nausea,  vomiting, or diaphoresis. No radiation of the chest discomfort. He reports  that his cough has been slightly productive. He has not checked his  temperature, but has felt feverish and has had a decreased appetite as well.   PAST MEDICAL HISTORY:  Significant for COPD. He was prescribed Advair by a  prior physician in the outpatient resident's clinic and back in April when  he was hospitalized, but had not returned for follow-up. He denies any  history of hypertension, diabetes, or coronary artery disease.   PAST SURGICAL HISTORY:  Hernia repair.   SOCIAL HISTORY:  He is a half-pack per day smoker and had smoked more in the  past. He denies any alcohol use. He works with [employer]. He has been doing that for the past five years. He is divorced and  has three grown children. Right now he lives alone, as he reports it is just  himself, his job, and his truck.   FAMILY HISTORY:  Denies family history of heart disease, diabetes, or  cancer.   REVIEW OF SYSTEMS:  As per the history of present illness. Denies any weight  loss or weight gain. No  nightsweats. No dark or ___________ sputum. No  hematochezia. No melena. No hemoptysis.   PHYSICAL EXAMINATION:  VITAL SIGNS: He is afebrile with temperature of 98.3,  heart rate 127, blood pressure 115/56, O2 saturation 90% on room air and 97%  on four liters.  GENERAL: He is in some mild distress, but is able to speak in complete  sentences. He has some retractions and is sitting up in bed to talk. He is  slightly cachectic appearing.  HEENT: Head is normocephalic and atraumatic. He has some mild sinus  tenderness over his maxillary sinuses. Oropharynx is slightly erythematous.  NECK: Supple with no JVD.  LUNGS: Decreased breath sounds bilaterally, with decreased air movement  noted. Some mild expiratory wheezes.  HEART: Heart rate is tachycardic.  ABDOMEN: Flat, soft, nontender. No rebound or guarding.  EXTREMITIES: Thin and warm with no peripheral edema noted.  NEUROLOGIC: He is alert and oriented. He has no focal neurologic  abnormalities that are notable.   LABORATORY DATA:  White  count 22,000, hemoglobin 12.8, platelet count  372,000.  D-dimer is slightly elevated at 1.26. Sodium 132, potassium 5,  chloride 97, CO2 22, glucose 114, BUN 25, creatinine 1.8, calcium 9.1, AST  21, ALT 16, alkaline phosphatase 71. CK is 346 with CK-MB of 2.0, troponin  less than 0.05.   Chest x-ray reveals bilateral patchy infiltrates and some slightly flattened  diaphragms consistent with COPD. EKG is pending.   IMPRESSION:  A 70 year old white male with bilateral pneumonia and chronic  obstructive pulmonary disease exacerbation. He does have a slightly elevated  D-dimer, but not any evidence of any pronounced pleuritic chest pain. At  this time will admit to telemetry bed, start IV antibiotics, IV steroids,  nebulizers, and oxygen therapy. Will check a blood gas on room air and also  obtain serial cardiac enzymes. We will consider getting a spiral CT if the  patient is not improving. GI  prophylaxis with Protonix and we will reassess  his condition following admission.       RLK/MEDQ  D:  12/16/2003  T:  12/16/2003  Job:  [id number]

## 2010-06-24 NOTE — Discharge Summary (Signed)
NAMESAVANNAH, ERBE               ACCOUNT NO.:  [id number]   MEDICAL RECORD NO.:  [id number]          PATIENT TYPE:  INP   LOCATION:  2016                         FACILITY:  [facility]   PHYSICIAN:  Jonna L. Robb Matar, M.D.DATE OF BIRTH:  Nov 05, 1940   DATE OF ADMISSION:  12/16/2003  DATE OF DISCHARGE:  12/20/2003                                 DISCHARGE SUMMARY   FINAL DIAGNOSES:  1.  Bibasilar pneumonia.  2.  Chronic obstructive pulmonary disease with exacerbation.  3.  Dehydration secondary to fever.  4.  Mild hyponatremia.   PCP:  Gentry Fitz.   CONSULTATIONS:  None.   OPERATIONS:  None.   ALLERGIES:  None.   CODE STATUS:  Full.   HISTORY:  This is a 70 year old white male with a history of COPD who had a  1 week history of productive cough, worsening dyspnea, fatigue, fever,  anorexia.   He has COPD and was prescribed Advair in the outpatient clinic but he said  they were supposed to call him for a followup appointment and did not do so.  He is a half a pack per day smoker and has been trying to quit.   PHYSICAL EXAM ON ADMISSION:  Was noted for a heart rate of 127, O2  saturation 90% on room air, moderate respiratory distress with retractions,  cachexia, sinus tenderness, bilateral expiratory wheezes.  Initial white  count was 22,000, slightly elevated D-dimer, sodium 132, BUN 25, creatinine  1.8.  Chest x-ray showed bilateral basilar patchy infiltrates and flattened  diaphragms.   HOSPITAL COURSE:  The patient was started on Rocephin and azithromycin and  then switched to Avelox.  He was on IV steroids and switched to oral  prednisone, nebulizers, oxygen therapy.  Serial enzymes, cardiac enzymes  were negative.  He had prophylaxis with Protonix.  The patient gradually  improved.  He was also put on a research protocol regarding use of long-term  Lovenox.   DISPOSITION:  The patient will be discharged on his:  1.  Advair b.i.d.  2.  Combivent 2 puffs up to every 4  hours as needed.  3.  Prednisone 20 mg daily for 3 days, 10 mg daily for 3 days, 5 mg daily      for 6 days.  4.  Tagamet h.s.  5.  Avelox 400 daily for a week.   He is to use Tylenol as needed.  He can return to work in one week light  duty and in two weeks full duty.  I had a smoking cessation conversation  with him.  [facility] will make an outpatient appointment either at the  clinic or perhaps [facility].       JLB/MEDQ  D:  12/20/2003  T:  12/20/2003  Job:  [id number]

## 2010-06-24 NOTE — Discharge Summary (Signed)
[name] [name], GLYMPH                           ACCOUNT NO.:  [id number]   MEDICAL RECORD NO.:  [id number]                   PATIENT TYPE:  INP   LOCATION:  5741                                 FACILITY:  [facility]   PHYSICIAN:  Fransisco Hertz, M.D.               DATE OF BIRTH:  Oct 29, 1940   DATE OF ADMISSION:  04/20/2003  DATE OF DISCHARGE:  04/23/2003                                 DISCHARGE SUMMARY   DISCHARGE DIAGNOSES:  1. Exacerbation of chronic obstructive pulmonary disease.  2. Gastroesophageal reflux disease.  3. Ongoing tobacco abuse.   DISCHARGE MEDICATIONS:  1. Avelox 400 mg p.o. daily for six days (to complete a total treatment     duration of eight days).  2. Advair 100/50 one puff q.12h.  3. Albuterol metered dose inhaler two puffs q.6h. p.r.n.  4. Protonix 40 mg p.o. daily for three weeks.  5. Guaifenesin 200 mg one p.o. q.4h. for three days.  6. Prednisone taper from 160 mg p.o. daily to 10 mg p.o. daily over 12 days.   DISPOSITION/FOLLOWUP:  The patient was discharged to his own home and will  follow up at the [facility] outpatient clinic on 05/11/03 at 2:00 p.m.  The  pertinent reason for this follow up will be to ensure proper resolution of  his COPD exacerbation and to ensure that he has no recurring symptoms.  Also  during the follow up visit his maintenance medicines will be worked out and  he will be advised to use albuterol only as needed.   CONSULTATIONS:  No major consultation was sought during this admission.   PROCEDURES:  The main procedure was done a chest x-ray that was done on  04/20/03 that showed long standing chronic obstructive pulmonary disease and  peribronchial thickening, suggestive of a bronchitis-like process.   HISTORY OF PRESENT ILLNESS:  This is a 70 year old Caucasian male who  presents with a five day history of progressive shortness of breath, cough  productive of yellowish phlegm and chest soreness that had resulted from  protracted coughing.  The patient noted mild associated upper abdominal pain  below the rib cage and he also noted subjective fever and chills.  He has  generalized weakness and reports no myalgias.   ALLERGIES:  The patient has no known drug allergies.   PAST MEDICAL HISTORY:  This is significant for a herniorrhaphy that he had  seven years ago prior and this was done as an outpatient.   MEDICATIONS:  He is basically not on any type of medications and notes that  he uses over the counter analgesics as needed.   SOCIAL HISTORY:  He is a current smoker.  Smokes a half to one pack a day of  cigarettes for the past 50 years.  Consumes alcohol on a social basis.  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  He has insurance  paying for his healthcare and prescriptions.   HOME AND SOCIAL SUPPORT:  He lives alone and is supported by a son who is an  emergency medical paramedic.   FAMILY HISTORY:  Significant for Alzheimer's disease in one of his siblings.   REVIEW OF SYSTEMS:  Significant for fatigue, chest pain, dyspnea, dyspnea on  exertion, cough, sputum, fever, chills, nausea, anorexia, epigastric  abdominal pain, cold intolerance and weakness.   PHYSICAL EXAMINATION:  VITAL SIGNS:  Pulse is 123, blood pressure 113/69,  temperature is 101, respirations are 22 per minute.  Oxygen saturation is  89% on room air.  GENERAL ASSESSMENT:  He is in fair general condition.  In no obvious  distress.  EYES:  Pupils are bilaterally equal, reactive to light and accommodation.  EENT:  His throat appears hyperemic and his tympanic membranes are normal.  NECK:  Supple and there is no goiter palpable.  RESPIRATORY SYSTEM:  Both lung fields reveal decreased breath sounds  intensity bilaterally predominantly over the bases, and he has minimal right  lower quadrant crackles.  CARDIOVASCULAR:  The pulse is tachycardic although regular.  Heart sounds S1  and S2 are normal.  ABDOMEN:  His  abdomen is soft, flat with mild epigastric tenderness and  normal bowel sounds.  EXTREMITIES:  He has no edema and no ecchymosis.  SKIN:  This is dry and warm.  NEURO AND PSYCHIATRIC:  Both non focal and normal.   ADMISSION LABS:  Sodium of 130, potassium of 3.7, chloride of 93,  bicarbonate of 26, BUN of 13, creatinine of 0.8 and glucose of 147.  Hemoglobin is at 14 grams per deciliter.  White cell count of 13.3 with  absolute neutrophils of 12.3 (92%) and platelet count of 203.  MCV is at  92.5.  Total bilirubin is at 0.4, alkaline phosphatase of 87, SGOT of 69,  SGPT of 87.  Protein of 6.2, albumin of 3.7 and calcium of 9.0.  Arterial  blood gas that was done on two liters per minute of oxygen by nasal cannula  showed a pH of 7.45, pCO2 of 42, pO2 of 78 and bicarbonate of 29.  Chest x-  ray done on admission is as described above.   HOSPITAL COURSE:  1. Exacerbation of chronic obstructive pulmonary disease:  This patient had     not had a prior history of COPD diagnosis due to no contact with medical     facility in this regard.  Based on the fact that he had had 50 years of     tobacco abuse history and was presenting with features suggestive of     peribronchial thickening, it was suggested that this patient had a     chronic obstructive lung disease process.  His left shift that was noted     on complete blood count was revealing of an infection as such.  Diagnosis     of exacerbation of chronic obstructive pulmonary disease was entertained.     The patient was started on a fluoroquinolone aimed to cover community     acquired pneumonia that is a common cause of COPD exacerbation and he was     also started on q.6h. nebulization with albuterol and Atrovent.  IV Solu-     Medrol was also added in addition to these in order to reduce the     inflammatory response that is normally seen in chronic obstructive    pulmonary disease.  For the first 12 hours, continuous pulse  oximetry  was     done and this was suspended on the next day of admission.  The patient     was gradually tapered from IV antibiotics to oral antibiotics and the     steroids were changed from  IV type to oral type as his lung examination     findings improved.  The patient was started on guaifenesin in order to     help with mucolysis and clearance of his respiratory secretions.  2. Gastroesophageal reflux disease:  In addition to the subcostal pain that     he was having from protracted coughing, the patient had epigastric     tenderness that was suggestive of esophagitis/gastritis.  The direct     cause of this would most likely be the COPD exacerbation and the stress     that the patient was currently undergoing.  The patient was started     Protonix p.o. and he showed good response to this with resolution of his     pain.  He will be continued on three more weeks of Protonix therapy upon     which this will be suspended.  3. Tobacco abuse:  The patient received counseling both for medical     personnel as well as the case manager, who gave him information on     tobacco cessation.  The patient has resolved that he will stop smoking     now that he has chronic obstructive pulmonary disease and understands the     process at this time.      Zetta Bills, MD                             Fransisco Hertz, M.D.    JP/MEDQ  D:  05/20/2003  T:  05/21/2003  Job:  [id number]

## 2010-07-03 ENCOUNTER — Other Ambulatory Visit: Payer: Self-pay | Admitting: Critical Care Medicine

## 2010-07-18 ENCOUNTER — Telehealth: Payer: Self-pay | Admitting: Critical Care Medicine

## 2010-07-18 MED ORDER — ALBUTEROL SULFATE HFA 108 (90 BASE) MCG/ACT IN AERS: INHALATION_SPRAY | RESPIRATORY_TRACT | Status: DC

## 2010-07-18 MED ORDER — FLUTICASONE-SALMETEROL 250-50 MCG/DOSE IN AEPB: 1.0000 | INHALATION_SPRAY | Freq: Two times a day (BID) | RESPIRATORY_TRACT | Status: DC

## 2010-07-18 NOTE — Telephone Encounter (Signed)
Rx refills sent to pharm ok x 1 only and LMOM for pt to be made aware that this was done. I advised that he keep appt tomorrow for additional refills.

## 2010-07-19 ENCOUNTER — Encounter: Payer: Self-pay | Admitting: Critical Care Medicine

## 2010-07-19 ENCOUNTER — Ambulatory Visit (INDEPENDENT_AMBULATORY_CARE_PROVIDER_SITE_OTHER): Payer: BC Managed Care – PPO | Admitting: Critical Care Medicine

## 2010-07-19 ENCOUNTER — Other Ambulatory Visit: Payer: Self-pay | Admitting: Critical Care Medicine

## 2010-07-19 VITALS — BP 124/84 | HR 83 | Temp 98.6°F | Ht 67.0 in | Wt 141.0 lb

## 2010-07-19 DIAGNOSIS — J449 Chronic obstructive pulmonary disease, unspecified: Secondary | ICD-10-CM

## 2010-07-19 NOTE — Progress Notes (Signed)
Subjective:    Patient ID: [name] [name], male    DOB: 08-03-1940, 70 y.o.   MRN: [id number]  HPI  70 y.o.  WM with COPD Golds Stage III.  February 02, 2010 9:44 AM  This pt was in [facility] 12/19 -12/23 for PNA LLL. All c/s were negative. Pt had airspace disease in the LLL. Now dyspnea is better than before. Mucus is less. Mucus is now not discolored. There is no chest pain.  now: min cough as before. mucus comes out, green white color. No real chest pain.  Dyspnea is better compared to before. There is no fever/chills/sweats. There is no excess mucus now  March 02, 2010 11:51 AM  f/u pna. notes sl cough last 3-4 days. feels like URI, is back at work.  No real chest pain, notes some tightness.Mucus now is sl discolored. Dyspnea is better.  min pn drip.   07/19/2010 Now not using oxygen since end of 2/12.  Not wearing at night or daytime.  No real cough.  No chest pain.  No smoking. Pt denies any significant sore throat, nasal congestion or excess secretions, fever, chills, sweats, unintended weight loss, pleurtic or exertional chest pain, orthopnea PND, or leg swelling Pt denies any increase in rescue therapy over baseline, denies waking up needing it or having any early am or nocturnal exacerbations of coughing/wheezing/or dyspnea. Pt also denies any obvious fluctuation in symptoms with  weather or environmental change or other alleviating or aggravating factors  Past Medical History  Diagnosis Date  . COPD (chronic obstructive pulmonary disease)   . Chronic bronchitis   . Pneumonia   . Hypertension      Family History  Problem Relation Age of Onset  . Breast cancer Mother      History   Social History  . Marital Status: Married    Spouse Name: N/A    Number of Children: N/A  . Years of Education: N/A   Occupational History  .      [employer]   Social History Main Topics  . Smoking status: Former Smoker -- 1.0 packs/day for 10 years    Types: Cigarettes   Quit date: 02/06/1998  . Smokeless tobacco: Never Used  . Alcohol Use: Not on file  . Drug Use: Not on file  . Sexually Active: Not on file   Other Topics Concern  . Not on file   Social History Narrative  . No narrative on file     Allergies  Allergen Reactions  . Codeine      Outpatient Prescriptions Prior to Visit  Medication Sig Dispense Refill  . albuterol (PROAIR HFA) 108 (90 BASE) MCG/ACT inhaler 1-2 puffs every 4-6 hours as needed  1 Inhaler  0  . dextromethorphan-guaiFENesin (MUCINEX DM) 30-600 MG per 12 hr tablet Take 1 tablet by mouth every 12 (twelve) hours.        . Fluticasone-Salmeterol (ADVAIR DISKUS) 250-50 MCG/DOSE AEPB Inhale 1 puff into the lungs 2 (two) times daily.  1 each  0  . hydrochlorothiazide 25 MG tablet Take 25 mg by mouth daily.        Marland Kitchen Respiratory Therapy Supplies (FLUTTER) DEVI Use 4 times daily       . tiotropium (SPIRIVA) 18 MCG inhalation capsule Place 18 mcg into inhaler and inhale daily.            Review of Systems Constitutional:   No  weight loss, night sweats,  Fevers, chills, fatigue, lassitude. HEENT:  No headaches,  Difficulty swallowing,  Tooth/dental problems,  Sore throat,                No sneezing, itching, ear ache, nasal congestion, post nasal drip,   CV:  No chest pain,  Orthopnea, PND, swelling in lower extremities, anasarca, dizziness, palpitations  GI  No heartburn, indigestion, abdominal pain, nausea, vomiting, diarrhea, change in bowel habits, loss of appetite  Resp: Notes  shortness of breath with exertion not  at rest.  No excess mucus, no productive cough,  No non-productive cough,  No coughing up of blood.  No change in color of mucus.  No wheezing.  No chest wall deformity  Skin: no rash or lesions.  GU: no dysuria, change in color of urine, no urgency or frequency.  No flank pain.  MS:  No joint pain or swelling.  No decreased range of motion.  No back pain.  Psych:  No change in mood or affect. No  depression or anxiety.  No memory loss.     Objective:   Physical Exam Filed Vitals:   07/19/10 1556  BP: 124/84  Pulse: 83  Temp: 98.6 F (37 C)  TempSrc: Oral  Height: 5\' 7"  (1.702 m)  Weight: 141 lb (63.957 kg)  SpO2: 94%    Gen: Pleasant, well-nourished, in no distress,  normal affect  ENT: No lesions,  mouth clear,  oropharynx clear, no postnasal drip  Neck: No JVD, no TMG, no carotid bruits  Lungs: No use of accessory muscles, no dullness to percussion, distant BS  Cardiovascular: RRR, heart sounds normal, no murmur or gallops, no peripheral edema  Abdomen: soft and NT, no HSM,  BS normal  Musculoskeletal: No deformities, no cyanosis or clubbing  Neuro: alert, non focal  Skin: Warm, no lesions or rashes        Assessment & Plan:   COPD Stable Copd golds III Plan  d/c oxygen No change in inhaled or maintenance medications. Return in  4 months     Updated Medication List Outpatient Encounter Prescriptions as of 07/19/2010  Medication Sig Dispense Refill  . albuterol (PROAIR HFA) 108 (90 BASE) MCG/ACT inhaler 1-2 puffs every 4-6 hours as needed  1 Inhaler  0  . dextromethorphan-guaiFENesin (MUCINEX DM) 30-600 MG per 12 hr tablet Take 1 tablet by mouth every 12 (twelve) hours.        . Fluticasone-Salmeterol (ADVAIR DISKUS) 250-50 MCG/DOSE AEPB Inhale 1 puff into the lungs 2 (two) times daily.  1 each  0  . hydrochlorothiazide 25 MG tablet Take 25 mg by mouth daily.        Marland Kitchen Respiratory Therapy Supplies (FLUTTER) DEVI Use 4 times daily       . tiotropium (SPIRIVA) 18 MCG inhalation capsule Place 18 mcg into inhaler and inhale daily.

## 2010-07-19 NOTE — Patient Instructions (Signed)
You may stop oxygen, will call [facility] to pick up No change in medications Return 4 months

## 2010-07-21 NOTE — Assessment & Plan Note (Signed)
Stable Copd golds III Plan  d/c oxygen No change in inhaled or maintenance medications. Return in  4 months

## 2010-09-20 ENCOUNTER — Other Ambulatory Visit: Payer: Self-pay | Admitting: Critical Care Medicine

## 2010-09-28 ENCOUNTER — Encounter: Payer: Self-pay | Admitting: *Deleted

## 2010-09-28 ENCOUNTER — Ambulatory Visit (INDEPENDENT_AMBULATORY_CARE_PROVIDER_SITE_OTHER): Payer: BC Managed Care – PPO | Admitting: Critical Care Medicine

## 2010-09-28 ENCOUNTER — Encounter: Payer: Self-pay | Admitting: Critical Care Medicine

## 2010-09-28 ENCOUNTER — Telehealth: Payer: Self-pay | Admitting: Critical Care Medicine

## 2010-09-28 ENCOUNTER — Ambulatory Visit (INDEPENDENT_AMBULATORY_CARE_PROVIDER_SITE_OTHER)
Admission: RE | Admit: 2010-09-28 | Discharge: 2010-09-28 | Disposition: A | Discharge status: Home / Self Care | Payer: BC Managed Care – PPO | Source: Ambulatory Visit | Attending: Critical Care Medicine | Admitting: Critical Care Medicine

## 2010-09-28 VITALS — BP 140/96 | HR 82 | Temp 98.1°F | Ht 67.0 in | Wt 145.2 lb

## 2010-09-28 DIAGNOSIS — J441 Chronic obstructive pulmonary disease with (acute) exacerbation: Secondary | ICD-10-CM

## 2010-09-28 DIAGNOSIS — J449 Chronic obstructive pulmonary disease, unspecified: Secondary | ICD-10-CM

## 2010-09-28 DIAGNOSIS — R05 Cough: Secondary | ICD-10-CM

## 2010-09-28 MED ORDER — AZITHROMYCIN 250 MG PO TABS: ORAL_TABLET | ORAL | Status: DC

## 2010-09-28 MED ORDER — PREDNISONE 10 MG PO TABS: ORAL_TABLET | ORAL | Status: DC

## 2010-09-28 NOTE — Patient Instructions (Signed)
You need to consider retiring from work Prednisone 10mg  Take 4 for three days, 3 for three days, 2 for three days, 1 for three days and stop Azithromycin 250mg  two once then one daily until gone Return 3 weeks for recheck No pneumonia on chest xray

## 2010-09-28 NOTE — Progress Notes (Signed)
Subjective:    Patient ID: [name] [name], male    DOB: 31-May-1940, 70 y.o.   MRN: [id number]  HPI   70 y.o.  WM with COPD Golds Stage III.  February 02, 2010 9:44 AM  This pt was in [facility] 12/19 -12/23 for PNA LLL. All c/s were negative. Pt had airspace disease in the LLL. Now dyspnea is better than before. Mucus is less. Mucus is now not discolored. There is no chest pain.  now: min cough as before. mucus comes out, green white color. No real chest pain.  Dyspnea is better compared to before. There is no fever/chills/sweats. There is no excess mucus now  March 02, 2010 11:51 AM  f/u pna. notes sl cough last 3-4 days. feels like URI, is back at work.  No real chest pain, notes some tightness.Mucus now is sl discolored. Dyspnea is better.  min pn drip.   6/12 Now not using oxygen since end of 2/12.  Not wearing at night or daytime.  No real cough.  No chest pain.  No smoking. Pt denies any significant sore throat, nasal congestion or excess secretions, fever, chills, sweats, unintended weight loss, pleurtic or exertional chest pain, orthopnea PND, or leg swelling Pt denies any increase in rescue therapy over baseline, denies waking up needing it or having any early am or nocturnal exacerbations of coughing/wheezing/or dyspnea. Pt also denies any obvious fluctuation in symptoms with  weather or environmental change or other alleviating or aggravating factors   8/22 Onset 69days ago,  [name] [name] back to work and now is worse with increased dyspnea at rest,  Now more cough and is productive creamy colored No blood.  No f/c/s.  No edema.  Notes more wheeze.  No pndrip Past Medical History  Diagnosis Date  . COPD (chronic obstructive pulmonary disease)   . Chronic bronchitis   . Pneumonia   . Hypertension      Family History  Problem Relation Age of Onset  . Breast cancer Mother      History   Social History  . Marital Status: Married    Spouse Name: N/A    Number of Children: N/A  .  Years of Education: N/A   Occupational History  .      [employer]   Social History Main Topics  . Smoking status: Former Smoker -- 1.0 packs/day for 10 years    Types: Cigarettes    Quit date: 02/06/1998  . Smokeless tobacco: Never Used  . Alcohol Use: Not on file  . Drug Use: Not on file  . Sexually Active: Not on file   Other Topics Concern  . Not on file   Social History Narrative  . No narrative on file     Allergies  Allergen Reactions  . Codeine      Outpatient Prescriptions Prior to Visit  Medication Sig Dispense Refill  . ADVAIR DISKUS 250-50 MCG/DOSE AEPB INHALE 1 PUFF TWICE A DAY  1 each  11  . dextromethorphan-guaiFENesin (MUCINEX DM) 30-600 MG per 12 hr tablet Take 1 tablet by mouth every 12 (twelve) hours.        Marland Kitchen PROAIR HFA 108 (90 BASE) MCG/ACT inhaler TAKE 1 TO 2 PUFFS BY MOUTH EVERY 4-6 HOURS AS NEEDED  1 Inhaler  6  . Respiratory Therapy Supplies (FLUTTER) DEVI Use 4 times daily       . tiotropium (SPIRIVA) 18 MCG inhalation capsule Place 18 mcg into inhaler and inhale daily.        Marland Kitchen  hydrochlorothiazide 25 MG tablet Take 25 mg by mouth daily.            Review of Systems  Constitutional:   No  weight loss, night sweats,  Fevers, chills, fatigue, lassitude. HEENT:   No headaches,  Difficulty swallowing,  Tooth/dental problems,  Sore throat,                No sneezing, itching, ear ache, nasal congestion, post nasal drip,   CV:  No chest pain,  Orthopnea, PND, swelling in lower extremities, anasarca, dizziness, palpitations  GI  No heartburn, indigestion, abdominal pain, nausea, vomiting, diarrhea, change in bowel habits, loss of appetite  Resp: Notes  shortness of breath with exertion and   at rest.  Notes excess mucus, notes productive cough,  No non-productive cough,  No coughing up of blood.  Notes  change in color of mucus.  No wheezing.  No chest wall deformity  Skin: no rash or lesions.  GU: no dysuria, change in color of urine,  no urgency or frequency.  No flank pain.  MS:  No joint pain or swelling.  No decreased range of motion.  No back pain.  Psych:  No change in mood or affect. No depression or anxiety.  No memory loss.     Objective:   Physical Exam  Filed Vitals:   09/28/10 1105  BP: 140/96  Pulse: 82  Temp: 98.1 F (36.7 C)  TempSrc: Oral  Height: 5\' 7"  (1.702 m)  Weight: 145 lb 3.2 oz (65.862 kg)  SpO2: 93%    Gen: Pleasant, well-nourished, in no distress,  normal affect  ENT: No lesions,  mouth clear,  oropharynx clear, no postnasal drip  Neck: No JVD, no TMG, no carotid bruits  Lungs: No use of accessory muscles, no dullness to percussion, distant BS  Cardiovascular: RRR, heart sounds normal, no murmur or gallops, no peripheral edema  Abdomen: soft and NT, no HSM,  BS normal  Musculoskeletal: No deformities, no cyanosis or clubbing  Neuro: alert, non focal  Skin: Warm, no lesions or rashes        Assessment & Plan:   COPD Copd exacerbation Pt can no longer work as a Copy d/t lung disease that is severe Plan You need to consider retiring from work Prednisone 10mg  Take 4 for three days, 3 for three days, 2 for three days, 1 for three days and stop Azithromycin 250mg  two once then one daily until gone Return 3 weeks for recheck Note CXR shows no PNA       Updated Medication List Outpatient Encounter Prescriptions as of 09/28/2010  Medication Sig Dispense Refill  . ADVAIR DISKUS 250-50 MCG/DOSE AEPB INHALE 1 PUFF TWICE A DAY  1 each  11  . dextromethorphan-guaiFENesin (MUCINEX DM) 30-600 MG per 12 hr tablet Take 1 tablet by mouth every 12 (twelve) hours.        Marland Kitchen PROAIR HFA 108 (90 BASE) MCG/ACT inhaler TAKE 1 TO 2 PUFFS BY MOUTH EVERY 4-6 HOURS AS NEEDED  1 Inhaler  6  . Respiratory Therapy Supplies (FLUTTER) DEVI Use 4 times daily       . tiotropium (SPIRIVA) 18 MCG inhalation capsule Place 18 mcg into inhaler and inhale daily.        Marland Kitchen azithromycin (ZITHROMAX)  250 MG tablet Take two once then one daily until gone  6 each  0  . predniSONE (DELTASONE) 10 MG tablet Take 4 for three days, 3 for three days, 2  for three days, 1 for three days and stop   30 tablet  0  . DISCONTD: hydrochlorothiazide 25 MG tablet Take 25 mg by mouth daily.

## 2010-09-28 NOTE — Telephone Encounter (Signed)
Pt c/o increased sob, chest tightness and cough with clear mucus x 2 days and would like to be seen. Pt is scheduled for acute ov today with PW at 11:30 am and will seek emergency help if his symptoms get worse before then.

## 2010-09-29 NOTE — Assessment & Plan Note (Addendum)
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

## 2010-10-13 ENCOUNTER — Other Ambulatory Visit: Payer: Self-pay | Admitting: Critical Care Medicine

## 2010-10-13 NOTE — Telephone Encounter (Signed)
Advair rx last sent on 07/19/10 #1 x 11.  I called [facility], spoke with Shawna Orleans who states they only received this as a fill once. They did not receive any additional refills with this rx.  Advised I would resend rx.

## 2010-10-19 ENCOUNTER — Encounter: Payer: Self-pay | Admitting: Critical Care Medicine

## 2010-10-19 ENCOUNTER — Ambulatory Visit (INDEPENDENT_AMBULATORY_CARE_PROVIDER_SITE_OTHER): Payer: BC Managed Care – PPO | Admitting: Critical Care Medicine

## 2010-10-19 VITALS — BP 122/72 | HR 84 | Temp 98.3°F | Ht 67.0 in | Wt 149.8 lb

## 2010-10-19 DIAGNOSIS — Z23 Encounter for immunization: Secondary | ICD-10-CM

## 2010-10-19 DIAGNOSIS — J449 Chronic obstructive pulmonary disease, unspecified: Secondary | ICD-10-CM

## 2010-10-19 NOTE — Patient Instructions (Signed)
No change in medications. Return in   4 months            Flu vaccine today 

## 2010-10-19 NOTE — Progress Notes (Signed)
Subjective:    Patient ID: [name] [name], male    DOB: [birth year], 70 y.o.   MRN: [id number]  HPI   70 y.o.  WM with COPD Golds Stage III.  February 02, 2010 9:44 AM  This pt was in [facility] 12/19 -12/23 for PNA LLL. All c/s were negative. Pt had airspace disease in the LLL. Now dyspnea is better than before. Mucus is less. Mucus is now not discolored. There is no chest pain.  now: min cough as before. mucus comes out, green white color. No real chest pain.  Dyspnea is better compared to before. There is no fever/chills/sweats. There is no excess mucus now  March 02, 2010 11:51 AM  f/u pna. notes sl cough last 3-4 days. feels like URI, is back at work.  No real chest pain, notes some tightness.Mucus now is sl discolored. Dyspnea is better.  min pn drip.   6/12 Now not using oxygen since end of 2/12.  Not wearing at night or daytime.  No real cough.  No chest pain.  No smoking. Pt denies any significant sore throat, nasal congestion or excess secretions, fever, chills, sweats, unintended weight loss, pleurtic or exertional chest pain, orthopnea PND, or leg swelling Pt denies any increase in rescue therapy over baseline, denies waking up needing it or having any early am or nocturnal exacerbations of coughing/wheezing/or dyspnea. Pt also denies any obvious fluctuation in symptoms with  weather or environmental change or other alleviating or aggravating factors   8/22 Onset 3days ago,  [name] [name] back to work and now is worse with increased dyspnea at rest,  Now more cough and is productive creamy colored No blood.  No f/c/s.  No edema.  Notes more wheeze.  No pndrip  10/19/2010 At last ov we rx pred/zpak and this helped. Now off work.  Plans to retire.  Now not much cough.   Pt denies any significant sore throat, nasal congestion or excess secretions, fever, chills, sweats, unintended weight loss, pleurtic or exertional chest pain, orthopnea PND, or leg swelling Pt denies any increase in rescue  therapy over baseline, denies waking up needing it or having any early am or nocturnal exacerbations of coughing/wheezing/or dyspnea. Pt also denies any obvious fluctuation in symptoms with  weather or environmental change or other alleviating or aggravating factors   Past Medical History  Diagnosis Date  . COPD (chronic obstructive pulmonary disease)   . Chronic bronchitis   . Pneumonia   . Hypertension      Family History  Problem Relation Age of Onset  . Breast cancer Mother      History   Social History  . Marital Status: Married    Spouse Name: N/A    Number of Children: N/A  . Years of Education: N/A   Occupational History  .      [employer]   Social History Main Topics  . Smoking status: Former Smoker -- 1.0 packs/day for 10 years    Types: Cigarettes    Quit date: 02/06/1998  . Smokeless tobacco: Never Used  . Alcohol Use: Not on file  . Drug Use: Not on file  . Sexually Active: Not on file   Other Topics Concern  . Not on file   Social History Narrative  . No narrative on file     Allergies  Allergen Reactions  . Codeine      Outpatient Prescriptions Prior to Visit  Medication Sig Dispense Refill  . ADVAIR DISKUS 250-50 MCG/DOSE  AEPB INHALE 1 PUFF INTO THE LUNGS TWICE A DAY  60 each  6  . dextromethorphan-guaiFENesin (MUCINEX DM) 30-600 MG per 12 hr tablet Take 1 tablet by mouth every 12 (twelve) hours.        Marland Kitchen PROAIR HFA 108 (90 BASE) MCG/ACT inhaler TAKE 1 TO 2 PUFFS BY MOUTH EVERY 4-6 HOURS AS NEEDED  1 Inhaler  6  . Respiratory Therapy Supplies (FLUTTER) DEVI Use 4 times daily       . SPIRIVA HANDIHALER 18 MCG inhalation capsule INHALE 2 PUFFS IN HANDIHALER DAILY  30 each  6  . azithromycin (ZITHROMAX) 250 MG tablet Take two once then one daily until gone  6 each  0  . predniSONE (DELTASONE) 10 MG tablet Take 4 for three days, 3 for three days, 2 for three days, 1 for three days and stop   30 tablet  0      Review of  Systems  Constitutional:   No  weight loss, night sweats,  Fevers, chills, fatigue, lassitude. HEENT:   No headaches,  Difficulty swallowing,  Tooth/dental problems,  Sore throat,                No sneezing, itching, ear ache, nasal congestion, post nasal drip,   CV:  No chest pain,  Orthopnea, PND, swelling in lower extremities, anasarca, dizziness, palpitations  GI  No heartburn, indigestion, abdominal pain, nausea, vomiting, diarrhea, change in bowel habits, loss of appetite  Resp: Notes  shortness of breath with exertion not   at rest.  No excess mucus, no productive cough,  No non-productive cough,  No coughing up of blood.  No  change in color of mucus.  No wheezing.  No chest wall deformity  Skin: no rash or lesions.  GU: no dysuria, change in color of urine, no urgency or frequency.  No flank pain.  MS:  No joint pain or swelling.  No decreased range of motion.  No back pain.  Psych:  No change in mood or affect. No depression or anxiety.  No memory loss.     Objective:   Physical Exam  Filed Vitals:   10/19/10 0932  BP: 122/72  Pulse: 84  Temp: 98.3 F (36.8 C)  TempSrc: Oral  Height: 5\' 7"  (1.702 m)  Weight: 149 lb 12.8 oz (67.949 kg)  SpO2: 94%    Gen: Pleasant, well-nourished, in no distress,  normal affect  ENT: No lesions,  mouth clear,  oropharynx clear, no postnasal drip  Neck: No JVD, no TMG, no carotid bruits  Lungs: No use of accessory muscles, no dullness to percussion, distant BS  Cardiovascular: RRR, heart sounds normal, no murmur or gallops, no peripheral edema  Abdomen: soft and NT, no HSM,  BS normal  Musculoskeletal: No deformities, no cyanosis or clubbing  Neuro: alert, non focal  Skin: Warm, no lesions or rashes        Assessment & Plan:   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 ADVAIR DISKUS 250-50 MCG/DOSE AEPB INHALE 1 PUFF INTO THE LUNGS TWICE A DAY  60 each  6  . dextromethorphan-guaiFENesin (MUCINEX DM) 30-600 MG per 12 hr tablet Take 1 tablet by mouth every 12 (twelve) hours.        Marland Kitchen PROAIR HFA 108 (90 BASE) MCG/ACT  inhaler TAKE 1 TO 2 PUFFS BY MOUTH EVERY 4-6 HOURS AS NEEDED  1 Inhaler  6  . Respiratory Therapy Supplies (FLUTTER) DEVI Use 4 times daily       . SPIRIVA HANDIHALER 18 MCG inhalation capsule INHALE 2 PUFFS IN HANDIHALER DAILY  30 each  6  . DISCONTD: azithromycin (ZITHROMAX) 250 MG tablet Take two once then one daily until gone  6 each  0  . DISCONTD: predniSONE (DELTASONE) 10 MG tablet Take 4 for three days, 3 for three days, 2 for three days, 1 for three days and stop   30 tablet  0

## 2010-10-19 NOTE — Assessment & Plan Note (Signed)
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

## 2010-10-19 NOTE — Progress Notes (Signed)
Addended by: Gweneth Dimitri D on: 10/19/2010 10:16 AM   Modules accepted: Orders

## 2010-12-09 ENCOUNTER — Telehealth: Payer: Self-pay | Admitting: *Deleted

## 2010-12-09 NOTE — Telephone Encounter (Signed)
Form signed by Dr. Delford Field and placed in the go backs to be sent back to [facility].

## 2010-12-09 NOTE — Telephone Encounter (Signed)
Message copied by Gweneth Dimitri D on Fri Dec 09, 2010  8:51 AM ------      Message from: Bary Leriche      Created: Wed Dec 07, 2010 11:38 AM       Hi Razia Screws,  I am sending Dr. Delford Field a form for the patient Cranston Koors to complete for his disability.  He need it to his employer by the 10th of this month.            Thank you,  Elease Hashimoto  At [employer]

## 2011-03-07 ENCOUNTER — Ambulatory Visit (INDEPENDENT_AMBULATORY_CARE_PROVIDER_SITE_OTHER): Payer: BC Managed Care – PPO | Admitting: Critical Care Medicine

## 2011-03-07 ENCOUNTER — Encounter: Payer: Self-pay | Admitting: Critical Care Medicine

## 2011-03-07 ENCOUNTER — Telehealth: Payer: Self-pay | Admitting: Critical Care Medicine

## 2011-03-07 DIAGNOSIS — J449 Chronic obstructive pulmonary disease, unspecified: Secondary | ICD-10-CM

## 2011-03-07 DIAGNOSIS — I10 Essential (primary) hypertension: Secondary | ICD-10-CM

## 2011-03-07 DIAGNOSIS — J4489 Other specified chronic obstructive pulmonary disease: Secondary | ICD-10-CM

## 2011-03-07 NOTE — Telephone Encounter (Signed)
I spoke with pt and is scheduled to come in 03/20/11 at 3:00. Pt aware the test is performed here on the 2nd floor of [address]. He also aware not to use his rescue inhaler 3 hrs prior to apt. He voiced his understanding

## 2011-03-07 NOTE — Progress Notes (Signed)
Subjective:    Patient ID: [name] [name], male    DOB: [birth year], 71 y.o.   MRN: [id number]  HPI   71 y.o.  WM with COPD Golds Stage III.    03/07/2011 No exacerbations since 9/12. Only one URI and did not develop , occurred one month ago. No recent abx or pred since 9/12.  Now is fully retired.    Pt denies any significant sore throat, nasal congestion or excess secretions, fever, chills, sweats, unintended weight loss, pleurtic or exertional chest pain, orthopnea PND, or leg swelling Pt denies any increase in rescue therapy over baseline, denies waking up needing it or having any early am or nocturnal exacerbations of coughing/wheezing/or dyspnea. Pt also denies any obvious fluctuation in symptoms with  weather or environmental change or other alleviating or aggravating factors     Past Medical History  Diagnosis Date  . COPD (chronic obstructive pulmonary disease)   . Chronic bronchitis   . Pneumonia   . Hypertension      Family History  Problem Relation Age of Onset  . Breast cancer Mother      History   Social History  . Marital Status: Married    Spouse Name: N/A    Number of Children: N/A  . Years of Education: N/A   Occupational History  .      [employer]   Social History Main Topics  . Smoking status: Former Smoker -- 1.0 packs/day for 10 years    Types: Cigarettes    Quit date: 02/06/1998  . Smokeless tobacco: Never Used  . Alcohol Use: Not on file  . Drug Use: Not on file  . Sexually Active: Not on file   Other Topics Concern  . Not on file   Social History Narrative  . No narrative on file     Allergies  Allergen Reactions  . Codeine      Outpatient Prescriptions Prior to Visit  Medication Sig Dispense Refill  . ADVAIR DISKUS 250-50 MCG/DOSE AEPB INHALE 1 PUFF INTO THE LUNGS TWICE A DAY  60 each  6  . dextromethorphan-guaiFENesin (MUCINEX DM) 30-600 MG per 12 hr tablet Take 1 tablet by mouth every 12 (twelve) hours.        Marland Kitchen  PROAIR HFA 108 (90 BASE) MCG/ACT inhaler TAKE 1 TO 2 PUFFS BY MOUTH EVERY 4-6 HOURS AS NEEDED  1 Inhaler  6  . Respiratory Therapy Supplies (FLUTTER) DEVI Use 4 times daily       . SPIRIVA HANDIHALER 18 MCG inhalation capsule INHALE 2 PUFFS IN HANDIHALER DAILY  30 each  6      Review of Systems  Constitutional:   No  weight loss, night sweats,  Fevers, chills, fatigue, lassitude. HEENT:   No headaches,  Difficulty swallowing,  Tooth/dental problems,  Sore throat,                No sneezing, itching, ear ache, nasal congestion, post nasal drip,   CV:  No chest pain,  Orthopnea, PND, swelling in lower extremities, anasarca, dizziness, palpitations  GI  No heartburn, indigestion, abdominal pain, nausea, vomiting, diarrhea, change in bowel habits, loss of appetite  Resp: Notes  shortness of breath with exertion not   at rest.  No excess mucus, no productive cough,  No non-productive cough,  No coughing up of blood.  No  change in color of mucus.  No wheezing.  No chest wall deformity  Skin: no rash or lesions.  GU:  no dysuria, change in color of urine, no urgency or frequency.  No flank pain.  MS:  No joint pain or swelling.  No decreased range of motion.  No back pain.  Psych:  No change in mood or affect. No depression or anxiety.  No memory loss.     Objective:   Physical Exam  Filed Vitals:   03/07/11 1041  BP: 152/96  Pulse: 100  Temp: 99 F (37.2 C)  TempSrc: Oral  Height: 5\' 8"  (1.727 m)  Weight: 171 lb 9.6 oz (77.837 kg)  SpO2: 92%    Gen: Pleasant, well-nourished, in no distress,  normal affect  ENT: No lesions,  mouth clear,  oropharynx clear, no postnasal drip  Neck: No JVD, no TMG, no carotid bruits  Lungs: No use of accessory muscles, no dullness to percussion, distant BS  Cardiovascular: RRR, heart sounds normal, no murmur or gallops, no peripheral edema  Abdomen: soft and NT, no HSM,  BS normal  Musculoskeletal: No deformities, no cyanosis or  clubbing  Neuro: alert, non focal  Skin: Warm, no lesions or rashes        Assessment & Plan:   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 ADVAIR DISKUS 250-50 MCG/DOSE AEPB INHALE 1 PUFF INTO THE LUNGS TWICE A DAY  60 each  6  . dextromethorphan-guaiFENesin (MUCINEX DM) 30-600 MG per 12 hr tablet Take 1 tablet by mouth every 12 (twelve) hours.        Marland Kitchen PROAIR HFA 108 (90 BASE) MCG/ACT inhaler TAKE 1 TO 2 PUFFS BY MOUTH EVERY 4-6 HOURS AS NEEDED  1 Inhaler  6  . Respiratory Therapy Supplies (FLUTTER) DEVI Use 4 times daily       . SPIRIVA HANDIHALER 18 MCG inhalation capsule INHALE 2 PUFFS IN HANDIHALER DAILY  30 each  6  . vitamin C (ASCORBIC ACID) 500 MG tablet Take 1,000 mg by mouth daily.

## 2011-03-07 NOTE — Assessment & Plan Note (Signed)
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

## 2011-03-07 NOTE — Patient Instructions (Signed)
No change in medications. Return in        6 months Remember to see your primary care physician and have your blood pressure rechecked soon

## 2011-03-07 NOTE — Telephone Encounter (Signed)
I spoke with pt and he states he received a letter from medical board today when he got home and it stated they looked over his letter from Dr. Delford Field and now is requesting pt to have a PFT test done and must have the results sent to them by 06/03/11. He stated the letter also stated if they do not receive this by then then they will take it as he is not retiring. Please advise Dr. Delford Field, thanks

## 2011-03-07 NOTE — Assessment & Plan Note (Signed)
This patient has episodic hypertension that is very labile Plan the patient has been advised to see his primary care physician for recheck of blood pressure and consideration for treatment

## 2011-03-07 NOTE — Telephone Encounter (Signed)
Ok to schedule full pfts

## 2011-03-20 ENCOUNTER — Ambulatory Visit (INDEPENDENT_AMBULATORY_CARE_PROVIDER_SITE_OTHER): Payer: BC Managed Care – PPO | Admitting: Critical Care Medicine

## 2011-03-20 DIAGNOSIS — J449 Chronic obstructive pulmonary disease, unspecified: Secondary | ICD-10-CM

## 2011-03-20 LAB — PULMONARY FUNCTION TEST

## 2011-03-20 NOTE — Progress Notes (Signed)
PFT done today. 

## 2011-03-22 ENCOUNTER — Encounter: Payer: Self-pay | Admitting: Critical Care Medicine

## 2011-03-22 ENCOUNTER — Telehealth: Payer: Self-pay | Admitting: Critical Care Medicine

## 2011-03-22 DIAGNOSIS — J449 Chronic obstructive pulmonary disease, unspecified: Secondary | ICD-10-CM

## 2011-03-22 NOTE — Telephone Encounter (Signed)
I called the patient regarding his PFTs

## 2011-03-28 ENCOUNTER — Encounter: Payer: Self-pay | Admitting: Critical Care Medicine

## 2011-05-27 ENCOUNTER — Other Ambulatory Visit: Payer: Self-pay | Admitting: Critical Care Medicine

## 2011-05-31 ENCOUNTER — Other Ambulatory Visit: Payer: Self-pay | Admitting: *Deleted

## 2011-05-31 MED ORDER — FLUTICASONE-SALMETEROL 250-50 MCG/DOSE IN AEPB: 1.0000 | INHALATION_SPRAY | Freq: Two times a day (BID) | RESPIRATORY_TRACT | Status: DC

## 2011-06-29 ENCOUNTER — Other Ambulatory Visit: Payer: Self-pay | Admitting: Critical Care Medicine

## 2011-09-04 ENCOUNTER — Ambulatory Visit (INDEPENDENT_AMBULATORY_CARE_PROVIDER_SITE_OTHER): Payer: BC Managed Care – PPO | Admitting: Critical Care Medicine

## 2011-09-04 ENCOUNTER — Encounter: Payer: Self-pay | Admitting: Critical Care Medicine

## 2011-09-04 VITALS — BP 152/90 | HR 107 | Temp 98.1°F | Ht 68.0 in | Wt 163.4 lb

## 2011-09-04 DIAGNOSIS — J449 Chronic obstructive pulmonary disease, unspecified: Secondary | ICD-10-CM

## 2011-09-04 NOTE — Progress Notes (Signed)
Subjective:    Patient ID: [name] [name], male    DOB: August 14, 1940, 71 y.o.   MRN: [id number]  HPI   71 y.o.  WM with COPD Golds Stage III.    1/29 No exacerbations since 9/12. Only one URI and did not develop , occurred one month ago. No recent abx or pred since 9/12.  Now is fully retired.    Pt denies any significant sore throat, nasal congestion or excess secretions, fever, chills, sweats, unintended weight loss, pleurtic or exertional chest pain, orthopnea PND, or leg swelling Pt denies any increase in rescue therapy over baseline, denies waking up needing it or having any early am or nocturnal exacerbations of coughing/wheezing/or dyspnea. Pt also denies any obvious fluctuation in symptoms with  weather or environmental change or other alleviating or aggravating factors  09/04/2011 Since last ov not much mucus.  Pt denies any significant sore throat, nasal congestion or excess secretions, fever, chills, sweats, unintended weight loss, pleurtic or exertional chest pain, orthopnea PND, or leg swelling Pt denies any increase in rescue therapy over baseline, denies waking up needing it or having any early am or nocturnal exacerbations of coughing/wheezing/or dyspnea. Pt also denies any obvious fluctuation in symptoms with  weather or environmental change or other alleviating or aggravating factors  CAT 5    Past Medical History  Diagnosis Date  . COPD (chronic obstructive pulmonary disease)   . Chronic bronchitis   . Pneumonia   . Hypertension      Family History  Problem Relation Age of Onset  . Breast cancer Mother      History   Social History  . Marital Status: Married    Spouse Name: N/A    Number of Children: N/A  . Years of Education: N/A   Occupational History  .      [employer]   Social History Main Topics  . Smoking status: Former Smoker -- 1.0 packs/day for 10 years    Types: Cigarettes    Quit date: 02/06/1998  . Smokeless tobacco: Never  Used  . Alcohol Use: Not on file  . Drug Use: Not on file  . Sexually Active: Not on file   Other Topics Concern  . Not on file   Social History Narrative  . No narrative on file     Allergies  Allergen Reactions  . Codeine      Outpatient Prescriptions Prior to Visit  Medication Sig Dispense Refill  . dextromethorphan-guaiFENesin (MUCINEX DM) 30-600 MG per 12 hr tablet Take 1 tablet by mouth every 12 (twelve) hours.        . Fluticasone-Salmeterol (ADVAIR DISKUS) 250-50 MCG/DOSE AEPB Inhale 1 puff into the lungs 2 (two) times daily.  60 each  1  . PROAIR HFA 108 (90 BASE) MCG/ACT inhaler TAKE 1 TO 2 PUFFS BY MOUTH EVERY 4-6 HOURS AS NEEDED  1 Inhaler  6  . Respiratory Therapy Supplies (FLUTTER) DEVI Use 4 times daily       . SPIRIVA HANDIHALER 18 MCG inhalation capsule INHALE 2 PUFFS (CONTENTS OF 1 CAPSULE) VIA HANDIHALER DEVICE ONCE A DAY  30 each  6  . vitamin C (ASCORBIC ACID) 500 MG tablet Take 1,000 mg by mouth daily.          Review of Systems  Constitutional:   No  weight loss, night sweats,  Fevers, chills, fatigue, lassitude. HEENT:   No headaches,  Difficulty swallowing,  Tooth/dental problems,  Sore throat,  No sneezing, itching, ear ache, nasal congestion, post nasal drip,   CV:  No chest pain,  Orthopnea, PND, swelling in lower extremities, anasarca, dizziness, palpitations  GI  No heartburn, indigestion, abdominal pain, nausea, vomiting, diarrhea, change in bowel habits, loss of appetite  Resp: Notes  shortness of breath with exertion not   at rest.  No excess mucus, no productive cough,  No non-productive cough,  No coughing up of blood.  No  change in color of mucus.  No wheezing.  No chest wall deformity  Skin: no rash or lesions.  GU: no dysuria, change in color of urine, no urgency or frequency.  No flank pain.  MS:  No joint pain or swelling.  No decreased range of motion.  No back pain.  Psych:  No change in mood or affect. No  depression or anxiety.  No memory loss.     Objective:   Physical Exam  Filed Vitals:   09/04/11 1447  BP: 152/90  Pulse: 107  Temp: 98.1 F (36.7 C)  TempSrc: Oral  Height: 5\' 8"  (1.727 m)  Weight: 163 lb 6.4 oz (74.118 kg)  SpO2: 90%    Gen: Pleasant, well-nourished, in no distress,  normal affect  ENT: No lesions,  mouth clear,  oropharynx clear, no postnasal drip  Neck: No JVD, no TMG, no carotid bruits  Lungs: No use of accessory muscles, no dullness to percussion, distant BS  Cardiovascular: RRR, heart sounds normal, no murmur or gallops, no peripheral edema  Abdomen: soft and NT, no HSM,  BS normal  Musculoskeletal: No deformities, no cyanosis or clubbing  Neuro: alert, non focal  Skin: Warm, no lesions or rashes        Assessment & Plan:   COPD CAT 5 09/04/2011 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 dextromethorphan-guaiFENesin (MUCINEX DM) 30-600 MG per 12 hr tablet Take 1 tablet by mouth every 12 (twelve) hours.        . Fluticasone-Salmeterol (ADVAIR DISKUS) 250-50 MCG/DOSE AEPB Inhale 1 puff into the lungs 2 (two) times daily.  60 each  1  . PROAIR HFA 108 (90 BASE) MCG/ACT inhaler TAKE 1 TO 2 PUFFS BY MOUTH EVERY 4-6 HOURS AS NEEDED  1 Inhaler  6  . Respiratory Therapy Supplies (FLUTTER) DEVI Use 4 times daily       . SPIRIVA HANDIHALER 18 MCG inhalation capsule INHALE 2 PUFFS (CONTENTS OF 1 CAPSULE) VIA HANDIHALER DEVICE ONCE A DAY  30 each  6  . vitamin C (ASCORBIC ACID) 500 MG tablet Take 1,000 mg by mouth daily.

## 2011-09-04 NOTE — Patient Instructions (Addendum)
No change in medications. Return in        6 months        

## 2011-09-04 NOTE — Assessment & Plan Note (Addendum)
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

## 2011-12-02 ENCOUNTER — Other Ambulatory Visit: Payer: Self-pay | Admitting: Critical Care Medicine

## 2012-03-06 ENCOUNTER — Ambulatory Visit: Payer: BC Managed Care – PPO | Admitting: Critical Care Medicine

## 2012-03-12 ENCOUNTER — Encounter: Payer: Self-pay | Admitting: Adult Health

## 2012-03-12 ENCOUNTER — Ambulatory Visit (INDEPENDENT_AMBULATORY_CARE_PROVIDER_SITE_OTHER)
Admission: RE | Admit: 2012-03-12 | Discharge: 2012-03-12 | Disposition: A | Discharge status: Home / Self Care | Payer: Medicare Other | Source: Ambulatory Visit | Attending: Adult Health | Admitting: Adult Health

## 2012-03-12 ENCOUNTER — Ambulatory Visit (INDEPENDENT_AMBULATORY_CARE_PROVIDER_SITE_OTHER): Payer: Medicare Other | Admitting: Adult Health

## 2012-03-12 VITALS — BP 106/74 | HR 91 | Temp 98.2°F | Ht 67.0 in | Wt 166.4 lb

## 2012-03-12 DIAGNOSIS — J449 Chronic obstructive pulmonary disease, unspecified: Secondary | ICD-10-CM

## 2012-03-12 DIAGNOSIS — J4489 Other specified chronic obstructive pulmonary disease: Secondary | ICD-10-CM

## 2012-03-12 MED ORDER — ALBUTEROL SULFATE HFA 108 (90 BASE) MCG/ACT IN AERS: INHALATION_SPRAY | RESPIRATORY_TRACT | Status: DC

## 2012-03-12 MED ORDER — AMOXICILLIN-POT CLAVULANATE 875-125 MG PO TABS: 1.0000 | ORAL_TABLET | Freq: Two times a day (BID) | ORAL | Status: AC

## 2012-03-12 MED ORDER — TIOTROPIUM BROMIDE MONOHYDRATE 18 MCG IN CAPS: 18.0000 ug | ORAL_CAPSULE | Freq: Every day | RESPIRATORY_TRACT | Status: DC

## 2012-03-12 MED ORDER — LEVALBUTEROL HCL 0.63 MG/3ML IN NEBU
0.6300 mg | INHALATION_SOLUTION | Freq: Once | RESPIRATORY_TRACT | Status: AC
  Administered 2012-03-12: 0.63 mg via RESPIRATORY_TRACT

## 2012-03-12 MED ORDER — PREDNISONE 10 MG PO TABS: ORAL_TABLET | ORAL | Status: DC

## 2012-03-12 NOTE — Progress Notes (Signed)
Subjective:    Patient ID: [name] [name]., male    DOB: 12-26-1940, 72 y.o.   MRN: [id number]  HPI   72 y.o.  WM with COPD Golds Stage III.    1/29 No exacerbations since 9/12. Only one URI and did not develop , occurred one month ago. No recent abx or pred since 9/12.  Now is fully retired.    Pt denies any significant sore throat, nasal congestion or excess secretions, fever, chills, sweats, unintended weight loss, pleurtic or exertional chest pain, orthopnea PND, or leg swelling Pt denies any increase in rescue therapy over baseline, denies waking up needing it or having any early am or nocturnal exacerbations of coughing/wheezing/or dyspnea. Pt also denies any obvious fluctuation in symptoms with  weather or environmental change or other alleviating or aggravating factors  09/04/2011 Since last ov not much mucus.   CAT 5  03/12/2012 Follow up  Pt returns for a 6 month follow up for COPD .  Was doing well untila last 2-3 weeks.  Has more SOB comes and goes - worse with cold wet weather - Non prod cough - Sinus drainage (clear) - Occas chest tightness - CAT = 8 Cough and wheezing are getting worse.  Does not feel good today  No chest pain , orthopnea , edema or hemoptysis .      Past Medical History  Diagnosis Date  . COPD (chronic obstructive pulmonary disease)   . Chronic bronchitis   . Pneumonia   . Hypertension      Family History  Problem Relation Age of Onset  . Breast cancer Mother      History   Social History  . Marital Status: Married    Spouse Name: N/A    Number of Children: N/A  . Years of Education: N/A   Occupational History  .      [employer]   Social History Main Topics  . Smoking status: Former Smoker -- 1.0 packs/day for 10 years    Types: Cigarettes    Quit date: 02/06/1998  . Smokeless tobacco: Never Used  . Alcohol Use: Not on file  . Drug Use: Not on file  . Sexually Active: Not on file   Other Topics Concern  . Not  on file   Social History Narrative  . No narrative on file     Allergies  Allergen Reactions  . Codeine      Outpatient Prescriptions Prior to Visit  Medication Sig Dispense Refill  . ADVAIR DISKUS 250-50 MCG/DOSE AEPB INHALE 1 PUFF INTO THE LUNGS TWICE A DAY  60 each  5  . dextromethorphan-guaiFENesin (MUCINEX DM) 30-600 MG per 12 hr tablet Take 1 tablet by mouth every 12 (twelve) hours.        Marland Kitchen Respiratory Therapy Supplies (FLUTTER) DEVI Use 4 times daily       . vitamin C (ASCORBIC ACID) 500 MG tablet Take 1,000 mg by mouth daily.      . [DISCONTINUED] PROAIR HFA 108 (90 BASE) MCG/ACT inhaler TAKE 1 TO 2 PUFFS BY MOUTH EVERY 4-6 HOURS AS NEEDED  1 Inhaler  6  . [DISCONTINUED] SPIRIVA HANDIHALER 18 MCG inhalation capsule INHALE 2 PUFFS (CONTENTS OF 1 CAPSULE) VIA HANDIHALER DEVICE ONCE A DAY  30 each  6  . [DISCONTINUED] Fluticasone-Salmeterol (ADVAIR DISKUS) 250-50 MCG/DOSE AEPB Inhale 1 puff into the lungs 2 (two) times daily.  60 each  1  Last reviewed on 03/12/2012 10:12 AM by Virgel Bouquet  Mellony Danziger, NP    Review of Systems  Constitutional:   No  weight loss, night sweats,  Fevers, chills, fatigue, lassitude. HEENT:   No headaches,  Difficulty swallowing,  Tooth/dental problems,  Sore throat,                No sneezing, itching, ear ache,  +nasal congestion, post nasal drip,   CV:  No chest pain,  Orthopnea, PND, swelling in lower extremities, anasarca, dizziness, palpitations  GI  No heartburn, indigestion, abdominal pain, nausea, vomiting, diarrhea, change in bowel habits, loss of appetite  Resp: Notes  shortness of breath with exertion not   at rest.   No chest wall deformity  Skin: no rash or lesions.  GU: no dysuria, change in color of urine, no urgency or frequency.  No flank pain.  MS:  No joint pain or swelling.  No decreased range of motion.  No back pain.  Psych:  No change in mood or affect. No depression or anxiety.  No memory loss.     Objective:   Physical  Exam  Filed Vitals:   03/12/12 1003  BP: 106/74  Pulse: 91  Temp: 98.2 F (36.8 C)  TempSrc: Oral  Height: 5\' 7"  (1.702 m)  Weight: 166 lb 6.4 oz (75.479 kg)  SpO2: 92%    Gen: Pleasant, , in no distress,  normal affect  ENT: No lesions,  mouth clear,  oropharynx clear, no postnasal drip  Neck: No JVD, no TMG, no carotid bruits  Lungs: No use of accessory muscles, no dullness to percussion, few rhonchi   Cardiovascular: RRR, heart sounds normal, no murmur or gallops, no peripheral edema  Abdomen: soft and NT, no HSM,  BS normal  Musculoskeletal: No deformities, no cyanosis or clubbing  Neuro: alert, non focal  Skin: Warm, no lesions or rashes        Assessment & Plan:   COPD Flare  Check xray today  xopenex neb in office today   Plan  Augmentin 875 twice daily for 7 days. Mucinex DM twice daily as needed. For cough and congestion. Prednisone taper. The next week. Fluids and rest. I will call with chest x-ray results. Followup. Dr. Delford Field in 6 months  and as needed  Please contact office for sooner follow up if symptoms do not improve or worsen or seek emergency care        Updated Medication List Outpatient Encounter Prescriptions as of 03/12/2012  Medication Sig Dispense Refill  . ADVAIR DISKUS 250-50 MCG/DOSE AEPB INHALE 1 PUFF INTO THE LUNGS TWICE A DAY  60 each  5  . albuterol (PROAIR HFA) 108 (90 BASE) MCG/ACT inhaler TAKE 1 TO 2 PUFFS BY MOUTH EVERY 4-6 HOURS AS NEEDED  1 Inhaler  6  . dextromethorphan-guaiFENesin (MUCINEX DM) 30-600 MG per 12 hr tablet Take 1 tablet by mouth every 12 (twelve) hours.        Marland Kitchen Respiratory Therapy Supplies (FLUTTER) DEVI Use 4 times daily       . tiotropium (SPIRIVA HANDIHALER) 18 MCG inhalation capsule Place 1 capsule (18 mcg total) into inhaler and inhale daily.  30 capsule  6  . vitamin C (ASCORBIC ACID) 500 MG tablet Take 1,000 mg by mouth daily.      . [DISCONTINUED] PROAIR HFA 108 (90 BASE) MCG/ACT inhaler  TAKE 1 TO 2 PUFFS BY MOUTH EVERY 4-6 HOURS AS NEEDED  1 Inhaler  6  . [DISCONTINUED] SPIRIVA HANDIHALER 18 MCG inhalation capsule INHALE 2 PUFFS (  CONTENTS OF 1 CAPSULE) VIA HANDIHALER DEVICE ONCE A DAY  30 each  6  . amoxicillin-clavulanate (AUGMENTIN) 875-125 MG per tablet Take 1 tablet by mouth 2 (two) times daily.  14 tablet  0  . predniSONE (DELTASONE) 10 MG tablet 4 tabs for 2 days, then 3 tabs for 2 days, 2 tabs for 2 days, then 1 tab for 2 days, then stop  20 tablet  0  . [DISCONTINUED] Fluticasone-Salmeterol (ADVAIR DISKUS) 250-50 MCG/DOSE AEPB Inhale 1 puff into the lungs 2 (two) times daily.  60 each  1

## 2012-03-12 NOTE — Patient Instructions (Addendum)
Augmentin 875 twice daily for 7 days. Mucinex DM twice daily as needed. For cough and congestion. Prednisone taper. The next week. Fluids and rest. I will call with chest x-ray results. Followup. Dr. Delford Field in 6 months  and as needed  Please contact office for sooner follow up if symptoms do not improve or worsen or seek emergency care

## 2012-03-12 NOTE — Assessment & Plan Note (Signed)
Flare  Check xray today  xopenex neb in office today   Plan  Augmentin 875 twice daily for 7 days. Mucinex DM twice daily as needed. For cough and congestion. Prednisone taper. The next week. Fluids and rest. I will call with chest x-ray results. Followup. Dr. Delford Field in 6 months  and as needed  Please contact office for sooner follow up if symptoms do not improve or worsen or seek emergency care

## 2012-03-12 NOTE — Addendum Note (Signed)
Addended by: Boone Master E on: 03/12/2012 11:41 AM   Modules accepted: Orders

## 2012-03-14 NOTE — Progress Notes (Signed)
Quick Note:  Called spoke with patient, advised of cxr results / recs as stated by TP. Pt verbalized his understanding and denied any questions. ______ 

## 2012-06-01 ENCOUNTER — Other Ambulatory Visit: Payer: Self-pay | Admitting: Critical Care Medicine

## 2012-06-03 NOTE — Telephone Encounter (Signed)
Proair rx sent on 03/12/12 #1 x 6  Jam[facility] with [facility] verified they did receive the rx sent in Feb. It was on hold. They will get rx ready for pt. Nothing further needed.

## 2012-06-20 DIAGNOSIS — J189 Pneumonia, unspecified organism: Secondary | ICD-10-CM | POA: Diagnosis not present

## 2012-06-20 DIAGNOSIS — R109 Unspecified abdominal pain: Secondary | ICD-10-CM | POA: Diagnosis not present

## 2012-06-20 DIAGNOSIS — R197 Diarrhea, unspecified: Secondary | ICD-10-CM | POA: Diagnosis not present

## 2012-10-02 ENCOUNTER — Encounter: Payer: Self-pay | Admitting: Critical Care Medicine

## 2012-10-02 ENCOUNTER — Ambulatory Visit (INDEPENDENT_AMBULATORY_CARE_PROVIDER_SITE_OTHER): Payer: BC Managed Care – PPO | Admitting: Critical Care Medicine

## 2012-10-02 VITALS — BP 138/90 | HR 92 | Temp 97.9°F | Ht 67.0 in | Wt 167.0 lb

## 2012-10-02 DIAGNOSIS — J449 Chronic obstructive pulmonary disease, unspecified: Secondary | ICD-10-CM

## 2012-10-02 MED ORDER — ALBUTEROL SULFATE HFA 108 (90 BASE) MCG/ACT IN AERS: INHALATION_SPRAY | RESPIRATORY_TRACT | Status: DC

## 2012-10-02 MED ORDER — TIOTROPIUM BROMIDE MONOHYDRATE 18 MCG IN CAPS: 18.0000 ug | ORAL_CAPSULE | Freq: Every day | RESPIRATORY_TRACT | Status: DC

## 2012-10-02 MED ORDER — FLUTICASONE-SALMETEROL 250-50 MCG/DOSE IN AEPB: INHALATION_SPRAY | RESPIRATORY_TRACT | Status: DC

## 2012-10-02 NOTE — Progress Notes (Signed)
Subjective:    Patient ID: [name] Morning., male    DOB: 20-Aug-1940, 72 y.o.   MRN: [id number]  HPI   72 y.o.  WM with COPD Golds Stage III.   10/02/2012 Chief Complaint  Patient presents with  . 6 month follow up    Was treated by PCP for pna in May.  C/o increased SOB over the past few days - believes this is d/t being bloated.  Occas cough with very little clear mucus.  No wheezing, chest tightness, or chest pain.  No issues recently. Notes min cough.  Mucus is clear.  No real wheeze.  Dyspnea about the same.   Pt denies any significant sore throat, nasal congestion or excess secretions, fever, chills, sweats, unintended weight loss, pleurtic or exertional chest pain, orthopnea PND, or leg swelling Pt denies any increase in rescue therapy over baseline, denies waking up needing it or having any early am or nocturnal exacerbations of coughing/wheezing/or dyspnea. Pt also denies any obvious fluctuation in symptoms with  weather or environmental change or other alleviating or aggravating factors  CAT Score 10/02/2012 03/12/2012  Total CAT Score 6 8   Past Medical History  Diagnosis Date  . COPD (chronic obstructive pulmonary disease)   . Chronic bronchitis   . Pneumonia   . Hypertension      Family History  Problem Relation Age of Onset  . Breast cancer Mother      History   Social History  . Marital Status: Married    Spouse Name: N/A    Number of Children: N/A  . Years of Education: N/A   Occupational History  .      [employer]   Social History Main Topics  . Smoking status: Former Smoker -- 1.00 packs/day for 10 years    Types: Cigarettes    Quit date: 02/06/1998  . Smokeless tobacco: Never Used  . Alcohol Use: Not on file  . Drug Use: Not on file  . Sexual Activity: Not on file   Other Topics Concern  . Not on file   Social History Narrative  . No narrative on file     Allergies  Allergen Reactions  . Codeine      Outpatient  Prescriptions Prior to Visit  Medication Sig Dispense Refill  . dextromethorphan-guaiFENesin (MUCINEX DM) 30-600 MG per 12 hr tablet Take 1 tablet by mouth every 12 (twelve) hours.        Marland Kitchen Respiratory Therapy Supplies (FLUTTER) DEVI Use 4 times daily       . vitamin C (ASCORBIC ACID) 500 MG tablet Take 1,000 mg by mouth daily.      Marland Kitchen ADVAIR DISKUS 250-50 MCG/DOSE AEPB INHALE 1 PUFF INTO THE LUNGS TWICE A DAY  60 each  5  . albuterol (PROAIR HFA) 108 (90 BASE) MCG/ACT inhaler TAKE 1 TO 2 PUFFS BY MOUTH EVERY 4-6 HOURS AS NEEDED  1 Inhaler  6  . tiotropium (SPIRIVA HANDIHALER) 18 MCG inhalation capsule Place 1 capsule (18 mcg total) into inhaler and inhale daily.  30 capsule  6  . predniSONE (DELTASONE) 10 MG tablet 4 tabs for 2 days, then 3 tabs for 2 days, 2 tabs for 2 days, then 1 tab for 2 days, then stop  20 tablet  0   No facility-administered medications prior to visit.      Review of Systems  Constitutional:   No  weight loss, night sweats,  Fevers, chills, fatigue, lassitude. HEENT:   No  headaches,  Difficulty swallowing,  Tooth/dental problems,  Sore throat,                No sneezing, itching, ear ache,  +nasal congestion, post nasal drip,   CV:  No chest pain,  Orthopnea, PND, swelling in lower extremities, anasarca, dizziness, palpitations  GI  No heartburn, indigestion, abdominal pain, nausea, vomiting, diarrhea, change in bowel habits, loss of appetite  Resp: Notes  shortness of breath with exertion not   at rest.   No chest wall deformity  Skin: no rash or lesions.  GU: no dysuria, change in color of urine, no urgency or frequency.  No flank pain.  MS:  No joint pain or swelling.  No decreased range of motion.  No back pain.  Psych:  No change in mood or affect. No depression or anxiety.  No memory loss.     Objective:   Physical Exam  Filed Vitals:   10/02/12 0850  BP: 138/90  Pulse: 92  Temp: 97.9 F (36.6 C)  TempSrc: Oral  Height: 5\' 7"  (1.702 m)   Weight: 167 lb (75.751 kg)  SpO2: 90%    Gen: Pleasant, , in no distress,  normal affect  ENT: No lesions,  mouth clear,  oropharynx clear, no postnasal drip  Neck: No JVD, no TMG, no carotid bruits  Lungs: No use of accessory muscles, no dullness to percussion, few rhonchi   Cardiovascular: RRR, heart sounds normal, no murmur or gallops, no peripheral edema  Abdomen: soft and NT, no HSM,  BS normal  Musculoskeletal: No deformities, no cyanosis or clubbing  Neuro: alert, non focal  Skin: Warm, no lesions or rashes        Assessment & Plan:   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 stable at this time Plan Maintain inhaled medications as prescribed     Updated Medication List Outpatient Encounter Prescriptions as of 10/02/2012  Medication Sig Dispense Refill  . albuterol (PROAIR HFA) 108 (90 BASE) MCG/ACT inhaler TAKE 1 TO 2 PUFFS BY MOUTH EVERY 4-6 HOURS AS NEEDED  1 Inhaler  6  . Cholecalciferol (VITAMIN D-3) 5000 UNITS TABS Take 1 capsule by mouth daily.      Marland Kitchen dextromethorphan-guaiFENesin (MUCINEX DM) 30-600 MG per 12 hr tablet Take 1 tablet by mouth every 12 (twelve) hours.        . Fluticasone-Salmeterol (ADVAIR DISKUS) 250-50 MCG/DOSE AEPB INHALE 1 PUFF INTO THE LUNGS TWICE A DAY  60 each  11  . Respiratory Therapy Supplies (FLUTTER) DEVI Use 4 times daily       . tiotropium (SPIRIVA HANDIHALER) 18 MCG inhalation capsule Place 1 capsule (18 mcg total) into inhaler and inhale daily.  30 capsule  11  . vitamin C (ASCORBIC ACID) 500 MG tablet Take 1,000 mg by mouth daily.      . [DISCONTINUED] ADVAIR DISKUS 250-50 MCG/DOSE AEPB INHALE 1 PUFF INTO THE LUNGS TWICE A DAY  60 each  5  . [DISCONTINUED] albuterol (PROAIR HFA) 108 (90 BASE) MCG/ACT inhaler TAKE 1 TO 2 PUFFS BY MOUTH EVERY 4-6 HOURS AS NEEDED   1 Inhaler  6  . [DISCONTINUED] tiotropium (SPIRIVA HANDIHALER) 18 MCG inhalation capsule Place 1 capsule (18 mcg total) into inhaler and inhale daily.  30 capsule  6  . [DISCONTINUED] predniSONE (DELTASONE) 10 MG tablet 4 tabs for 2 days, then 3 tabs  for 2 days, 2 tabs for 2 days, then 1 tab for 2 days, then stop  20 tablet  0   No facility-administered encounter medications on file as of 10/02/2012.

## 2012-10-02 NOTE — Assessment & Plan Note (Signed)
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 stable at this time Plan Maintain inhaled medications as prescribed

## 2012-10-02 NOTE — Patient Instructions (Addendum)
No change in medications. Return in        6 months        

## 2012-10-13 DIAGNOSIS — Z23 Encounter for immunization: Secondary | ICD-10-CM | POA: Diagnosis not present

## 2012-10-20 ENCOUNTER — Other Ambulatory Visit: Payer: Self-pay | Admitting: Adult Health

## 2012-12-20 DIAGNOSIS — J441 Chronic obstructive pulmonary disease with (acute) exacerbation: Secondary | ICD-10-CM | POA: Diagnosis not present

## 2012-12-21 ENCOUNTER — Inpatient Hospital Stay (HOSPITAL_COMMUNITY)
Admission: EM | Admit: 2012-12-21 | Discharge: 2012-12-23 | DRG: 192 | Disposition: A | Discharge status: Home / Self Care | Payer: Medicare Other | Attending: Internal Medicine | Admitting: Internal Medicine

## 2012-12-21 ENCOUNTER — Encounter (HOSPITAL_COMMUNITY): Payer: Self-pay | Admitting: Emergency Medicine

## 2012-12-21 ENCOUNTER — Emergency Department (HOSPITAL_COMMUNITY): Payer: Medicare Other

## 2012-12-21 DIAGNOSIS — R0602 Shortness of breath: Secondary | ICD-10-CM | POA: Diagnosis not present

## 2012-12-21 DIAGNOSIS — K219 Gastro-esophageal reflux disease without esophagitis: Secondary | ICD-10-CM | POA: Diagnosis not present

## 2012-12-21 DIAGNOSIS — Z87891 Personal history of nicotine dependence: Secondary | ICD-10-CM

## 2012-12-21 DIAGNOSIS — Z79899 Other long term (current) drug therapy: Secondary | ICD-10-CM

## 2012-12-21 DIAGNOSIS — R112 Nausea with vomiting, unspecified: Secondary | ICD-10-CM | POA: Diagnosis present

## 2012-12-21 DIAGNOSIS — I10 Essential (primary) hypertension: Secondary | ICD-10-CM | POA: Diagnosis present

## 2012-12-21 DIAGNOSIS — J4489 Other specified chronic obstructive pulmonary disease: Secondary | ICD-10-CM | POA: Diagnosis not present

## 2012-12-21 DIAGNOSIS — J441 Chronic obstructive pulmonary disease with (acute) exacerbation: Principal | ICD-10-CM | POA: Diagnosis present

## 2012-12-21 DIAGNOSIS — J449 Chronic obstructive pulmonary disease, unspecified: Secondary | ICD-10-CM | POA: Diagnosis not present

## 2012-12-21 LAB — COMPREHENSIVE METABOLIC PANEL
ALT: 16 U/L (ref 0–53)
Alkaline Phosphatase: 65 U/L (ref 39–117)
CO2: 22 mEq/L (ref 19–32)
Calcium: 9.4 mg/dL (ref 8.4–10.5)
Creatinine, Ser: 0.97 mg/dL (ref 0.50–1.35)
GFR calc Af Amer: >90 mL/min (ref >90)
GFR calc non Af Amer: 81 mL/min — ABNORMAL LOW (ref >90)
Glucose, Bld: 133 mg/dL — ABNORMAL HIGH (ref 70–99)
Potassium: 3.4 mEq/L — ABNORMAL LOW (ref 3.5–5.1)
Sodium: 137 mEq/L (ref 135–145)

## 2012-12-21 LAB — CBC WITH DIFFERENTIAL/PLATELET
Eosinophils Relative: 0 % (ref 0–5)
HCT: 44 % (ref 39.0–52.0)
Hemoglobin: 15.3 g/dL (ref 13.0–17.0)
Lymphocytes Relative: 4 % — ABNORMAL LOW (ref 12–46)
Lymphs Abs: 0.6 10*3/uL — ABNORMAL LOW (ref 0.7–4.0)
MCV: 92.2 fL (ref 78.0–100.0)
Monocytes Absolute: 0.2 10*3/uL (ref 0.1–1.0)
Monocytes Relative: 1 % — ABNORMAL LOW (ref 3–12)
Neutrophils Relative %: 95 % — ABNORMAL HIGH (ref 43–77)
Platelets: 323 10*3/uL (ref 150–400)
RBC: 4.77 MIL/uL (ref 4.22–5.81)
WBC: 14.8 10*3/uL — ABNORMAL HIGH (ref 4.0–10.5)

## 2012-12-21 LAB — POCT I-STAT 3, ART BLOOD GAS (G3+)
O2 Saturation: 96 %
Patient temperature: 98.6
TCO2: 23 mmol/L (ref 0–100)
pH, Arterial: 7.431 (ref 7.350–7.450)

## 2012-12-21 MED ORDER — IPRATROPIUM BROMIDE 0.02 % IN SOLN
0.5000 mg | Freq: Four times a day (QID) | RESPIRATORY_TRACT | Status: DC
  Administered 2012-12-21 – 2012-12-23 (×7): 0.5 mg via RESPIRATORY_TRACT
  Filled 2012-12-21 (×7): qty 2.5

## 2012-12-21 MED ORDER — ONDANSETRON HCL 4 MG PO TABS: 4.0000 mg | ORAL_TABLET | Freq: Four times a day (QID) | ORAL | Status: DC | PRN

## 2012-12-21 MED ORDER — SODIUM CHLORIDE 0.9 % IJ SOLN
3.0000 mL | INTRAMUSCULAR | Status: DC | PRN
  Administered 2012-12-22: 10:00:00 3 mL via INTRAVENOUS

## 2012-12-21 MED ORDER — DEXTROSE 5 % IV SOLN
1.0000 g | INTRAVENOUS | Status: DC
  Administered 2012-12-21 – 2012-12-22 (×2): 1 g via INTRAVENOUS
  Filled 2012-12-21 (×4): qty 10

## 2012-12-21 MED ORDER — SODIUM CHLORIDE 0.9 % IV BOLUS (SEPSIS)
500.0000 mL | INTRAVENOUS | Status: AC
  Administered 2012-12-21: 500 mL via INTRAVENOUS

## 2012-12-21 MED ORDER — ALBUTEROL SULFATE (5 MG/ML) 0.5% IN NEBU
2.5000 mg | INHALATION_SOLUTION | RESPIRATORY_TRACT | Status: DC
  Administered 2012-12-21: 2.5 mg via RESPIRATORY_TRACT
  Filled 2012-12-21: qty 0.5

## 2012-12-21 MED ORDER — ALBUTEROL SULFATE (5 MG/ML) 0.5% IN NEBU: 10.0000 mg | INHALATION_SOLUTION | RESPIRATORY_TRACT | Status: DC

## 2012-12-21 MED ORDER — SODIUM CHLORIDE 0.9 % IJ SOLN: 3.0000 mL | Freq: Two times a day (BID) | INTRAMUSCULAR | Status: DC

## 2012-12-21 MED ORDER — ONDANSETRON HCL 4 MG/2ML IJ SOLN: 4.0000 mg | Freq: Four times a day (QID) | INTRAMUSCULAR | Status: DC | PRN

## 2012-12-21 MED ORDER — ENOXAPARIN SODIUM 40 MG/0.4ML ~~LOC~~ SOLN
40.0000 mg | SUBCUTANEOUS | Status: DC
  Administered 2012-12-21 – 2012-12-22 (×2): 40 mg via SUBCUTANEOUS
  Filled 2012-12-21 (×3): qty 0.4

## 2012-12-21 MED ORDER — ALBUTEROL (5 MG/ML) CONTINUOUS INHALATION SOLN
10.0000 mg/h | INHALATION_SOLUTION | Freq: Once | RESPIRATORY_TRACT | Status: AC
  Administered 2012-12-21: 10 mg/h via RESPIRATORY_TRACT
  Filled 2012-12-21: qty 20

## 2012-12-21 MED ORDER — ALUM & MAG HYDROXIDE-SIMETH 200-200-20 MG/5ML PO SUSP
30.0000 mL | Freq: Four times a day (QID) | ORAL | Status: DC | PRN
  Administered 2012-12-22 – 2012-12-23 (×4): 30 mL via ORAL
  Filled 2012-12-21 (×4): qty 30

## 2012-12-21 MED ORDER — IPRATROPIUM BROMIDE 0.02 % IN SOLN
0.5000 mg | RESPIRATORY_TRACT | Status: DC
  Administered 2012-12-21: 0.5 mg via RESPIRATORY_TRACT
  Filled 2012-12-21: qty 2.5

## 2012-12-21 MED ORDER — ALBUTEROL SULFATE (5 MG/ML) 0.5% IN NEBU
2.5000 mg | INHALATION_SOLUTION | Freq: Four times a day (QID) | RESPIRATORY_TRACT | Status: DC
  Administered 2012-12-21 – 2012-12-23 (×7): 2.5 mg via RESPIRATORY_TRACT
  Filled 2012-12-21 (×7): qty 0.5

## 2012-12-21 MED ORDER — ACETAMINOPHEN 325 MG PO TABS
650.0000 mg | ORAL_TABLET | Freq: Four times a day (QID) | ORAL | Status: DC | PRN
  Administered 2012-12-22 – 2012-12-23 (×2): 650 mg via ORAL
  Filled 2012-12-21 (×2): qty 2

## 2012-12-21 MED ORDER — SODIUM CHLORIDE 0.9 % IV SOLN: 250.0000 mL | INTRAVENOUS | Status: DC | PRN

## 2012-12-21 MED ORDER — METHYLPREDNISOLONE SODIUM SUCC 125 MG IJ SOLR
60.0000 mg | Freq: Two times a day (BID) | INTRAMUSCULAR | Status: DC
  Administered 2012-12-21 – 2012-12-23 (×4): 60 mg via INTRAVENOUS
  Filled 2012-12-21 (×3): qty 0.96
  Filled 2012-12-21: qty 2
  Filled 2012-12-21 (×3): qty 0.96

## 2012-12-21 MED ORDER — ALBUTEROL SULFATE (5 MG/ML) 0.5% IN NEBU: 2.5000 mg | INHALATION_SOLUTION | RESPIRATORY_TRACT | Status: DC | PRN

## 2012-12-21 MED ORDER — DEXTROSE 5 % IV SOLN
500.0000 mg | INTRAVENOUS | Status: DC
  Administered 2012-12-21 – 2012-12-22 (×2): 500 mg via INTRAVENOUS
  Filled 2012-12-21 (×3): qty 500

## 2012-12-21 MED ORDER — ALBUTEROL SULFATE (5 MG/ML) 0.5% IN NEBU: 2.5000 mg | INHALATION_SOLUTION | Freq: Four times a day (QID) | RESPIRATORY_TRACT | Status: DC | PRN

## 2012-12-21 MED ORDER — ACETAMINOPHEN 650 MG RE SUPP
650.0000 mg | Freq: Four times a day (QID) | RECTAL | Status: DC | PRN
  Filled 2012-12-21: qty 1

## 2012-12-21 NOTE — ED Provider Notes (Signed)
CSN: [id number]     Arrival date & time 12/21/12  1039 History   First MD Initiated Contact with Patient 12/21/12 1044     Chief Complaint  Patient presents with  . Shortness of Breath   (Consider location/radiation/quality/duration/timing/severity/associated sxs/prior Treatment) Patient is a 72 y.o. male presenting with shortness of breath. The history is provided by the patient.  Shortness of Breath Severity:  Moderate Onset quality:  Gradual Duration:  3 days Timing:  Constant Progression:  Worsening Chronicity:  New Context comment:  At rest Relieved by:  Nothing Worsened by:  Nothing tried Ineffective treatments: albuterol. Associated symptoms: sputum production (white, scant), vomiting and wheezing   Associated symptoms: no abdominal pain, no chest pain, no cough, no fever, no headaches and no neck pain     Past Medical History  Diagnosis Date  . COPD (chronic obstructive pulmonary disease)   . Chronic bronchitis   . Pneumonia   . Hypertension    Past Surgical History  Procedure Laterality Date  . Tonsillectomy    . 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 Breast cancer Mother    History  Substance Use Topics  . Smoking status: Former Smoker -- 1.00 packs/day for 10 years    Types: Cigarettes    Quit date: 02/06/1998  . Smokeless tobacco: Never Used  . Alcohol Use: Not on file    Review of Systems  Constitutional: Negative for fever.  HENT: Negative for drooling and rhinorrhea.   Eyes: Negative for pain.  Respiratory: Positive for sputum production (white, scant), shortness of breath and wheezing. Negative for cough.   Cardiovascular: Negative for chest pain and leg swelling.  Gastrointestinal: Positive for vomiting. Negative for nausea, abdominal pain and diarrhea.  Genitourinary: Negative for dysuria and hematuria.  Musculoskeletal: Negative for gait problem and neck pain.  Skin: Negative for  color change.  Neurological: Negative for numbness and headaches.  Hematological: Negative for adenopathy.  Psychiatric/Behavioral: Negative for behavioral problems.  All other systems reviewed and are negative.    Allergies  Codeine  Home Medications   Current Outpatient Rx  Name  Route  Sig  Dispense  Refill  . albuterol (PROAIR HFA) 108 (90 BASE) MCG/ACT inhaler      TAKE 1 TO 2 PUFFS BY MOUTH EVERY 4-6 HOURS AS NEEDED   1 Inhaler   6   . Cholecalciferol (VITAMIN D-3) 5000 UNITS TABS   Oral   Take 1 capsule by mouth daily.         Marland Kitchen dextromethorphan-guaiFENesin (MUCINEX DM) 30-600 MG per 12 hr tablet   Oral   Take 1 tablet by mouth every 12 (twelve) hours.           . Fluticasone-Salmeterol (ADVAIR DISKUS) 250-50 MCG/DOSE AEPB      INHALE 1 PUFF INTO THE LUNGS TWICE A DAY   60 each   11   . Respiratory Therapy Supplies (FLUTTER) DEVI      Use 4 times daily          . SPIRIVA HANDIHALER 18 MCG inhalation capsule      PLACE 1 CAPSULE (18 MCG TOTAL) INTO INHALER AND INHALE DAILY.   30 capsule   6   . vitamin C (ASCORBIC ACID) 500 MG tablet   Oral   Take 1,000 mg by mouth daily.          BP 150/92  Pulse 104  Temp(Src) 97.5 F (  36.4 C) (Oral)  Resp 28  SpO2 88% Physical Exam  Nursing note and vitals reviewed. Constitutional: He is oriented to person, place, and time. He appears well-developed and well-nourished.  HENT:  Head: Normocephalic and atraumatic.  Right Ear: External ear normal.  Left Ear: External ear normal.  Nose: Nose normal.  Mouth/Throat: Oropharynx is clear and moist. No oropharyngeal exudate.  Eyes: Conjunctivae and EOM are normal. Pupils are equal, round, and reactive to light.  Neck: Normal range of motion. Neck supple.  Cardiovascular: Normal heart sounds and intact distal pulses.  Exam reveals no gallop and no friction rub.   No murmur heard. HR 104  Pulmonary/Chest: He is in respiratory distress. He has wheezes.  Mild  to mod diffuse wheezing. Pt speaking in short phrases.   Abdominal: Soft. Bowel sounds are normal. He exhibits no distension. There is no tenderness. There is no rebound and no guarding.  Musculoskeletal: Normal range of motion. He exhibits no edema and no tenderness.  Neurological: He is alert and oriented to person, place, and time.  Skin: Skin is warm and dry.  Psychiatric: He has a normal mood and affect. His behavior is normal.    ED Course  Procedures (including critical care time) Labs Review Labs Reviewed  CBC WITH DIFFERENTIAL - Abnormal; Notable for the following:    WBC 14.8 (*)    Neutrophils Relative % 95 (*)    Neutro Abs 14.0 (*)    Lymphocytes Relative 4 (*)    Lymphs Abs 0.6 (*)    Monocytes Relative 1 (*)    All other components within normal limits  COMPREHENSIVE METABOLIC PANEL - Abnormal; Notable for the following:    Potassium 3.4 (*)    Glucose, Bld 133 (*)    AST 52 (*)    Total Bilirubin <0.1 (*)    GFR calc non Af Amer 81 (*)    All other components within normal limits  PRO B NATRIURETIC PEPTIDE - Abnormal; Notable for the following:    Pro B Natriuretic peptide (BNP) 504.2 (*)    All other components within normal limits  POCT I-STAT 3, BLOOD GAS (G3+) - Abnormal; Notable for the following:    pCO2 arterial 32.4 (*)    All other components within normal limits  LIPASE, BLOOD  BASIC METABOLIC PANEL  CBC   Imaging Review Dg Chest Portable 1 View  12/21/2012   CLINICAL DATA:  Shortness of breath, COPD, smoker  EXAM: PORTABLE CHEST - 1 VIEW  COMPARISON:  03/12/2012  FINDINGS: Chronic background COPD/ emphysema with hyperinflation and parenchymal scarring. Normal heart size and vascularity. No significant interval change, edema, or pneumonia. No effusion or pneumothorax. Trachea midline.  IMPRESSION: Stable COPD/emphysema.  No superimposed acute process   Electronically Signed   By: Ruel Favors M.D.   On: 12/21/2012 11:55    EKG Interpretation      Ventricular Rate:  99 PR Interval:  153 QRS Duration: 104 QT Interval:  336 QTC Calculation: 431 R Axis:   92 Text Interpretation:  Sinus tachycardia Multiform ventricular premature complexes Right atrial enlargement Low voltage, precordial leads Consider right ventricular hypertrophy            MDM   1. COPD exacerbation   2. Esophageal reflux   3. Unspecified essential hypertension    10:53 AM 72 y.o. male with a history of COPD who presents with shortness of breath for the last 3 days. He states that 3 days ago he had  several episodes of emesis. He notes mild shortness of breath ever since that time. He has had some continued retching but denies any further emesis. He denies any fevers or chest pain. He states that he developed a mild cough of white sputum yesterday. He was seen by his primary care Dr. yesterday who prescribed him antibiotics and prednisone. He states that he took the prednisone yesterday and are E. to 60 mg this morning. He is afebrile here, mildly tachycardic, slightly hypoxic on room air with an O2 saturation of 80%. Will get labs, chest x-ray, breathing treatment.  Will admit to hospitalist.     Junius Argyle, MD 12/21/12 2024

## 2012-12-21 NOTE — ED Notes (Addendum)
Pt reports on Thursday he had n/v and then he became SOB and has been "Getting worse since." states he went to PCP yesterday and they started him on abx and prednisone but symptoms are worse. States he could not sleep due to SOB last night. Pt is able to speak in short phrases but is visibly labored and wheezing. Has been using nebs at home with no relief

## 2012-12-21 NOTE — Progress Notes (Signed)
NURSING PROGRESS NOTE  [name] [name] [id number] Admission Data: 12/21/2012 4:17 PM Attending Provider: Hollice Espy, MD [id number], Joelene Millin, NP Code Status: Full  [name] Taha. is a 72 y.o. male patient admitted from ED:  -No acute distress noted.  -No complaints of shortness of breath.  -No complaints of chest pain.   Blood pressure 122/59, pulse 107, temperature 97.7 F (36.5 C), temperature source Oral, resp. rate 23, SpO2 93.00%.   IV Fluids:  IV in place, occlusive dsg intact without redness, IV cath forearm left, condition patent and no redness none.   Allergies:  Codeine  Past Medical History:   has a past medical history of COPD (chronic obstructive pulmonary disease); Chronic bronchitis; Pneumonia; and Hypertension.  Past Surgical History:   has past surgical history that includes Tonsillectomy and Fetal surgery for congenital hernia (1990's).  Social History:   reports that he quit smoking about 14 years ago. His smoking use included Cigarettes. He has a 10 pack-year smoking history. He has never used smokeless tobacco. He reports that he does not drink alcohol or use illicit drugs.  Skin: Intact  Patient/Family orientated to room. Information packet given to patient/family. Admission inpatient armband information verified with patient/family to include name and date of birth and placed on patient arm. Side rails up x 2, fall assessment and education completed with patient/family. Patient/family able to verbalize understanding of risk associated with falls and verbalized understanding to call for assistance before getting out of bed. Call light within reach. Patient/family able to voice and demonstrate understanding of unit orientation instructions.    Will continue to evaluate and treat per MD orders.

## 2012-12-21 NOTE — ED Notes (Signed)
Respiratory at bedside.

## 2012-12-21 NOTE — ED Notes (Signed)
Breathing labored-- unable to speak in complete sentences- using accessory muscles-- continues to wheeze after treatment. Dr. Romeo Apple informed.

## 2012-12-21 NOTE — ED Notes (Addendum)
Contact-- Dorann Lodge -- [phone]

## 2012-12-21 NOTE — H&P (Signed)
[facility] History and Physical  [name] [name]. ZOX:[id number] DOB: March 03, 1940 DOA: 12/21/2012  Referring physician: Dr. Romeo Apple, ER physician PCP: [name], NP  Specialists: None  Chief Complaint: Shortness of breath and cough  HPI: [name] [name]. is a 72 y.o. male  Past oral history of COPD and hypertension who is usually well-controlled. He's not had a hospitalized exacerbation in several years. For the most part, his breathing is well controlled and he does not have too many flareups. A few days ago, he woke up with some sudden onset severe projectile vomiting. The following day he noticed to be more labored breathing with wheezing and cough. He suspected he may have had some aspiration of vomitus. He went to see his PCP who put him on by mouth steroids and antibiotics and gave him a breathing treatment in the office. Patient's symptoms persisted so he came into the emergency room today for further evaluation. In the emergency room, patient was noted to have a minimally elevated BNP of 504 and a white count of 14.8 with a 95% shift. Chest x-ray noted chronic COPD but no acute infiltrate. Patient was noted to be hypoxic with oxygen saturations in the middle 80s on room. He was put on 2 L nasal cannula and a blood gas was checked. PH is 97.43 PCO2 of 32 and a PO2 of 81. Patient's other lab work was unremarkable. Patient received breathing treatment which did improve his overall symptoms, however oxygen saturations dropped when supplemental oxygen was removed. Hospitalists were called for further evaluation and admission.  Review of Systems: Patient seen after arrival to floor. Breathing much easier. Denies any headaches, vision changes, dysphasia, chest pain, palpitations. His breathing is easier although still not back to his baseline. Denies any current wheezing, and mild cough, also much improved. Denies any abdominal pain, hematuria, dysuria, constipation, diarrhea, focal  extremity numbness or weakness or pain. His review systems otherwise negative.  Past Medical History  Diagnosis Date  . COPD (chronic obstructive pulmonary disease)   . Chronic bronchitis   . Pneumonia   . Hypertension    Past Surgical History  Procedure Laterality Date  . Tonsillectomy    . 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 His smoking use included Cigarettes. He has a 10 pack-year smoking history. He has never used smokeless tobacco. He reports that he does not drink alcohol or use illicit drugs. Patient lives at home by himself. He is normally active and participates in most activities of daily living without assistance. He is not on home oxygen  Allergies  Allergen Reactions  . Codeine     unknown    Family History  Problem Relation Age of Onset  . Breast cancer Mother     Prior to Admission medications   Medication Sig Start Date End Date Taking? Authorizing Provider  albuterol (ACCUNEB) 0.63 MG/3ML nebulizer solution Take 1 ampule by nebulization every 6 (six) hours as needed for wheezing.   Yes Historical Provider, MD  albuterol (PROVENTIL HFA;VENTOLIN HFA) 108 (90 BASE) MCG/ACT inhaler Inhale 1 puff into the lungs every 6 (six) hours as needed for wheezing or shortness of breath.   Yes Historical Provider, MD  albuterol-ipratropium (COMBIVENT) 18-103 MCG/ACT inhaler Inhale 1 puff into the lungs every 4 (four) hours.   Yes Historical Provider, MD  azithromycin (ZITHROMAX) 250 MG tablet Take 250-500 mg by mouth daily. Take two tablets on the  first day. Take 1 tablet once daily for four days.   Yes Historical Provider, MD  Cholecalciferol (VITAMIN D-3) 5000 UNITS TABS Take 1 capsule by mouth daily.   Yes Historical Provider, MD  Fluticasone-Salmeterol (ADVAIR DISKUS) 250-50 MCG/DOSE AEPB INHALE 1 PUFF INTO THE LUNGS TWICE A DAY 10/02/12  Yes Storm Frisk, MD  predniSONE (DELTASONE) 10 MG tablet  Take 30 mg by mouth once.   Yes Historical Provider, MD  SPIRIVA HANDIHALER 18 MCG inhalation capsule PLACE 1 CAPSULE (18 MCG TOTAL) INTO INHALER AND INHALE DAILY. 10/20/12  Yes Storm Frisk, MD   Physical Exam: Filed Vitals:   12/21/12 1349  BP: 122/59  Pulse: 107  Temp: 97.7 F (36.5 C)  Resp: 23     General:  Alert and oriented x3, slightly anxious secondary to albuterol, no acute distress otherwise  Eyes: Sclera nonicteric, extraocular movements are intact  ENT: Normocephalic, atraumatic, mucous membranes are slightly dry  Neck: Supple, no JVD  Cardiovascular: Regular rate and rhythm, S1-S2  Respiratory: Decreased breath sounds throughout, audible expiratory wheezing is more upper airway  Abdomen: Soft, nontender, nondistended, positive bowel sounds  Skin: No skin breaks, tears or lesions  Musculoskeletal: No clubbing or cyanosis or edema  Psychiatric: Patient is appropriate, no evidence of psychoses  Neurologic: No focal deficits  Labs on Admission:  Basic Metabolic Panel:  Recent Labs Lab 12/21/12 1055  NA 137  K 3.4*  CL 97  CO2 22  GLUCOSE 133*  BUN 18  CREATININE 0.97  CALCIUM 9.4   Liver Function Tests:  Recent Labs Lab 12/21/12 1055  AST 52*  ALT 16  ALKPHOS 65  BILITOT <0.1*  PROT 8.0  ALBUMIN 4.4    Recent Labs Lab 12/21/12 1055  LIPASE 15   No results found for this basename: AMMONIA,  in the last 168 hours CBC:  Recent Labs Lab 12/21/12 1055  WBC 14.8*  NEUTROABS 14.0*  HGB 15.3  HCT 44.0  MCV 92.2  PLT 323   Cardiac Enzymes: No results found for this basename: CKTOTAL, CKMB, CKMBINDEX, TROPONINI,  in the last 168 hours  BNP (last 3 results)  Recent Labs  12/21/12 1055  PROBNP 504.2*   CBG: No results found for this basename: GLUCAP,  in the last 168 hours  Radiological Exams on Admission: Dg Chest Portable 1 View  12/21/2012   CLINICAL DATA:  Shortness of breath, COPD, smoker  EXAM: PORTABLE CHEST - 1  VIEW  COMPARISON:  03/12/2012  FINDINGS: Chronic background COPD/ emphysema with hyperinflation and parenchymal scarring. Normal heart size and vascularity. No significant interval change, edema, or pneumonia. No effusion or pneumothorax. Trachea midline.  IMPRESSION: Stable COPD/emphysema.  No superimposed acute process   Electronically Signed   By: Ruel Favors M.D.   On: 12/21/2012 11:55    EKG: Independently reviewed. 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 Already much clinically improved. And likely change antibiotics and steroids to by mouth tomorrow and ambulate on room air. Good oxygen saturation can go home. Activated COPD gold protocol. When necessary albuterol and scheduled meds plus IV Solu-Medrol. Supplemental oxygen and given concerns for possible pneumonia, IV antibiotics Active Problems:   HYPERTENSION: Continue home meds   GERD: Stable Episodes of nausea and vomiting: Sounds like passing gastroenteritis which has since resolved.   Code Status:  Full code Family Communication: Patient has a family. He comment that he would to his neighbors who brought him in what is going on Disposition Plan: As long as he is breathing continues to improve and is able to maintain good oxygen saturation off of supplemental O2, can go home likely tomorrow  Time spent: 25 minutes  Hollice Espy [facility] Pager [phone]  If 7PM-7AM, please contact night-coverage [email or web address] Password [facility] 12/21/2012, 2:44 PM

## 2012-12-22 DIAGNOSIS — J449 Chronic obstructive pulmonary disease, unspecified: Secondary | ICD-10-CM

## 2012-12-22 LAB — BASIC METABOLIC PANEL
BUN: 20 mg/dL (ref 6–23)
CO2: 28 mEq/L (ref 19–32)
Calcium: 8.9 mg/dL (ref 8.4–10.5)
Chloride: 98 mEq/L (ref 96–112)
Creatinine, Ser: 0.93 mg/dL (ref 0.50–1.35)
GFR calc non Af Amer: 83 mL/min — ABNORMAL LOW (ref >90)
Glucose, Bld: 126 mg/dL — ABNORMAL HIGH (ref 70–99)

## 2012-12-22 LAB — CBC
Hemoglobin: 12.7 g/dL — ABNORMAL LOW (ref 13.0–17.0)
MCH: 31 pg (ref 26.0–34.0)
MCHC: 32.9 g/dL (ref 30.0–36.0)
MCV: 94.1 fL (ref 78.0–100.0)
Platelets: 263 10*3/uL (ref 150–400)
RBC: 4.1 MIL/uL — ABNORMAL LOW (ref 4.22–5.81)

## 2012-12-22 MED ORDER — FUROSEMIDE 10 MG/ML IJ SOLN
40.0000 mg | Freq: Once | INTRAMUSCULAR | Status: AC
  Administered 2012-12-22: 10:00:00 40 mg via INTRAVENOUS
  Filled 2012-12-22: qty 4

## 2012-12-22 MED ORDER — POTASSIUM CHLORIDE CRYS ER 20 MEQ PO TBCR
40.0000 meq | EXTENDED_RELEASE_TABLET | Freq: Four times a day (QID) | ORAL | Status: AC
  Administered 2012-12-22 (×2): 40 meq via ORAL
  Filled 2012-12-22 (×2): qty 2

## 2012-12-22 MED ORDER — GUAIFENESIN ER 600 MG PO TB12
1200.0000 mg | ORAL_TABLET | Freq: Two times a day (BID) | ORAL | Status: DC
  Administered 2012-12-22 – 2012-12-23 (×3): 1200 mg via ORAL
  Filled 2012-12-22 (×4): qty 2

## 2012-12-22 MED ORDER — GUAIFENESIN-DM 100-10 MG/5ML PO SYRP
5.0000 mL | ORAL_SOLUTION | ORAL | Status: DC | PRN
  Filled 2012-12-22: qty 5

## 2012-12-22 NOTE — Progress Notes (Signed)
[facility] PROGRESS NOTE  [name] [name]. ZOX:[id number] DOB: [birth year] DOA: 12/21/2012 PCP: [name], NP  HPI/Subjective: Still short of breath, but cough improved, no sputum production. He got short of breath when he was talking to me.  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 -Started on azithromycin and ceftriaxone. -Has a severe COPD with FEV1 of 40%. -Standard treatment with IV antibiotics, IV systemic steroids, bronchodilators and oxygen as needed. -Supportive management with antitussives and mucolytics.  Hypertension -Continue home medications.  GERD -Continue home medications.  Code Status: Full code Family Communication: Plan discussed with the patient. Disposition Plan: Remains inpatient   Consultants:  None  Procedures:  None  Antibiotics:  Ceftriaxone, azithromycin started on 12/21/2012.   Objective: Filed Vitals:   12/22/12 0500  BP: 126/68  Pulse: 84  Temp: 98.6 F (37 C)  Resp: 20    Intake/Output Summary (Last 24 hours) at 12/22/12 0912 Last data filed at 12/22/12 0500  Gross per 24 hour  Intake    250 ml  Output    400 ml  Net   -150 ml   Filed Weights   12/21/12 1610  Weight: 72.1 kg (158 lb 15.2 oz)    Exam: General: Alert and awake, oriented x3, not in any acute distress. HEENT: anicteric sclera, pupils reactive to light and accommodation, EOMI CVS: S1-S2 clear, no murmur rubs or gallops Chest: Scattered rhonchi, expiratory wheezes Abdomen: soft nontender, nondistended, normal bowel sounds, no organomegaly Extremities: no cyanosis, clubbing or edema noted bilaterally Neuro: Cranial nerves II-XII intact, no focal neurological deficits  Data Reviewed: Basic Metabolic  Panel:  Recent Labs Lab 12/21/12 1055 12/22/12 0615  NA 137 137  K 3.4* 3.2*  CL 97 98  CO2 22 28  GLUCOSE 133* 126*  BUN 18 20  CREATININE 0.97 0.93  CALCIUM 9.4 8.9   Liver Function Tests:  Recent Labs Lab 12/21/12 1055  AST 52*  ALT 16  ALKPHOS 65  BILITOT <0.1*  PROT 8.0  ALBUMIN 4.4    Recent Labs Lab 12/21/12 1055  LIPASE 15   No results found for this basename: AMMONIA,  in the last 168 hours CBC:  Recent Labs Lab 12/21/12 1055 12/22/12 0615  WBC 14.8* 10.0  NEUTROABS 14.0*  --   HGB 15.3 12.7*  HCT 44.0 38.6*  MCV 92.2 94.1  PLT 323 263   Cardiac Enzymes: No results found for this basename: CKTOTAL, CKMB, CKMBINDEX, TROPONINI,  in the last 168 hours BNP (last 3 results)  Recent Labs  12/21/12 1055  PROBNP 504.2*   CBG: No results found for this basename: GLUCAP,  in the last 168 hours  Micro No results found for this or any previous visit (from the past 240 hour(s)).   Studies: Dg Chest Portable 1 View  12/21/2012   CLINICAL DATA:  Shortness of breath, COPD, smoker  EXAM: PORTABLE CHEST - 1 VIEW  COMPARISON:  03/12/2012  FINDINGS: Chronic background COPD/ emphysema with hyperinflation and parenchymal scarring. Normal heart size and vascularity. No significant interval change, edema, or pneumonia. No effusion or pneumothorax. Trachea midline.  IMPRESSION: Stable COPD/emphysema.  No superimposed acute process   Electronically Signed   By: Ruel Favors M.D.   On: 12/21/2012 11:55    Scheduled Meds: . albuterol  2.5 mg Nebulization  Q6H  . azithromycin  500 mg Intravenous Q24H  . cefTRIAXone (ROCEPHIN)  IV  1 g Intravenous Q24H  . enoxaparin (LOVENOX) injection  40 mg Subcutaneous Q24H  . ipratropium  0.5 mg Nebulization Q6H  . methylPREDNISolone (SOLU-MEDROL) injection  60 mg Intravenous Q12H  . sodium chloride  3 mL Intravenous Q12H   Continuous Infusions: . albuterol         Time spent: 35 minutes    [facility]  [facility] Pager [phone] If 7PM-7AM, please contact night-coverage at [email or web address], password [facility] 12/22/2012, 9:12 AM  LOS: 1 day

## 2012-12-22 NOTE — Progress Notes (Signed)
Utilization Review completed.  

## 2012-12-22 NOTE — Evaluation (Signed)
Physical Therapy Evaluation Patient Details Name: [name] [name]. MRN: [id number] DOB: [birth year] Today's Date: 12/22/2012 Time: 7425-9563 PT Time Calculation (min): 17 min  PT Assessment / Plan / Recommendation History of Present Illness  pt adm secondary to SOB and cough. PMH includes COPD, HTN  Clinical Impression  Pt adm due to the above. Pt at baseline level of functional independence at this time. Pt demo wheezing with increased ambulation. Pt O2 destat on RA 72%. When returned to 2L O2 and given cues for pursed lip breathing, pt returned to 92%. Pt with no acute PT needs at this time. Will sign off. Encouraged pt to continue ambulating with nursing.     PT Assessment  Patent does not need any further PT services    Follow Up Recommendations  Supervision - Intermittent    Does the patient have the potential to tolerate intense rehabilitation      Barriers to Discharge        Equipment Recommendations  None recommended by PT    Recommendations for Other Services     Frequency      Precautions / Restrictions Precautions Precautions: None Precaution Comments: denies any recent falls Restrictions Weight Bearing Restrictions: No   Pertinent Vitals/Pain See impression for O2 stats; no c/o pain.       Mobility  Bed Mobility Bed Mobility: Supine to Sit;Sitting - Scoot to Edge of Bed;Sit to Supine Supine to Sit: 7: Independent Sitting - Scoot to Edge of Bed: 7: Independent Sit to Supine: 7: Independent Transfers Transfers: Sit to Stand;Stand to Sit Sit to Stand: 6: Modified independent (Device/Increase time);From bed Stand to Sit: 6: Modified independent (Device/Increase time);To bed Details for Transfer Assistance: incr time due to wheezing; no physical (A) needed Ambulation/Gait Ambulation/Gait Assistance: 6: Modified independent (Device/Increase time) Ambulation Distance (Feet): 200 Feet Assistive device: None Ambulation/Gait Assistance Details: pt demo good  safety awareness; no AD required; no LOB noted. pt was wheezing at end of walk; O2 destat to 72%; pt returned to 2L O2  Gait Pattern: Within Functional Limits Gait velocity: pt states "i was actually walking faster than usual right there" Stairs: No (pt reports he is at baseline; denies need for steps) Wheelchair Mobility Wheelchair Mobility: No         PT Diagnosis:    PT Problem List:   PT Treatment Interventions:       PT Goals(Current goals can be found in the care plan section) Acute Rehab PT Goals Patient Stated Goal: to go home tomorrow PT Goal Formulation: No goals set, d/c therapy  Visit Information  Last PT Received On: 12/22/12 Assistance Needed: +1 History of Present Illness: pt adm secondary to SOB and cough. PMH includes COPD, HTN       Prior Functioning  Home Living Family/patient expects to be discharged to:: Private residence Living Arrangements: Alone Available Help at Discharge: Friend(s);Neighbor;Available PRN/intermittently Type of Home: House Home Access: Stairs to enter [employer] of Steps: 4 Entrance Stairs-Rails: Can reach both;Left;Right Home Layout: One level Home Equipment: None Additional Comments: has walk in shower with seat; standard toilets Prior Function Level of Independence: Independent Comments: pt drives, works PRN at [employer] Communication: No difficulties    Cognition  Cognition Arousal/Alertness: Awake/alert Behavior During Therapy: WFL for tasks assessed/performed Overall Cognitive Status: Within Functional Limits for tasks assessed    Extremity/Trunk Assessment Upper Extremity Assessment Upper Extremity Assessment: Defer to OT evaluation Lower Extremity Assessment Lower Extremity Assessment: Overall WFL for tasks assessed  Cervical / Trunk Assessment Cervical / Trunk Assessment: Normal   Balance Balance Balance Assessed: Yes Static Sitting Balance Static Sitting - Balance Support:  No upper extremity supported;Feet unsupported Static Sitting - Level of Assistance: 7: Independent Static Sitting - Comment/# of Minutes: 5 min High Level Balance High Level Balance Activites: Sudden stops;Head turns;Turns;Direction changes High Level Balance Comments: no LOB noted   End of Session PT - End of Session Equipment Utilized During Treatment: Oxygen Activity Tolerance: Patient tolerated treatment well Patient left: in bed;with call bell/phone within reach Nurse Communication: Mobility status;Other (comment) (O2 stats)  GP Functional Assessment Tool Used: clinical judgement  Functional Limitation: Mobility: Walking and moving around Mobility: Walking and Moving Around Current Status [phone]): 0 percent impaired, limited or restricted Mobility: Walking and Moving Around Goal Status [phone]): 0 percent impaired, limited or restricted Mobility: Walking and Moving Around Discharge Status [phone]): 0 percent impaired, limited or restricted   Donell Sievert, Standard [phone] 12/22/2012, 12:18 PM

## 2012-12-22 NOTE — Evaluation (Signed)
Occupational Therapy Evaluation Patient Details Name: [name] [name]. MRN: [id number] DOB: Apr 23, 1940 Today's Date: 12/22/2012 Time: 1478-2956 OT Time Calculation (min): 17 min  OT Assessment / Plan / Recommendation History of present illness pt adm secondary to SOB and cough. PMH includes COPD, HTN   Clinical Impression   Pt admitted with above. Pt is at independent/mod I level with ADLs and functional mobility. However, still very SOB on 2L O2.  Educated pt on energy conservation techniques to incorporate into ADL/IADLs.   Recommend pt continue mobilizing with RN staff during length of hospital stay but no further acute OT needs.    OT Assessment  Patient does not need any further OT services    Follow Up Recommendations  No OT follow up    Barriers to Discharge      Equipment Recommendations  None recommended by OT    Recommendations for Other Services    Frequency       Precautions / Restrictions Precautions Precautions: None Precaution Comments: denies any recent falls Restrictions Weight Bearing Restrictions: No   Pertinent Vitals/Pain 3/4 dyspnea.  >90% on 2L nasal cannula throughout session    ADL  Eating/Feeding: Performed;Independent Where Assessed - Eating/Feeding: Edge of bed Lower Body Dressing: Performed;Independent Where Assessed - Lower Body Dressing: Unsupported sit to stand Toilet Transfer: Simulated;Modified independent Toilet Transfer Method: Sit to Barista:  (bed) Transfers/Ambulation Related to ADLs: mod I ADL Comments: Pt at baseline with ADLs.  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 Recommended pt have a chair or tall stool he can use when making copies.  Pt reports he typically leans on wall at home when showering. Recommended sitting on built in seat rather than stand in shower.  Emphasized importance of sitting for any task >5  min.  Pt verbalized understanding.      OT Diagnosis:    OT Problem List:   OT Treatment Interventions:     OT Goals(Current goals can be found in the care plan section) Acute Rehab OT Goals Patient Stated Goal: to go home tomorrow  Visit Information  Last OT Received On: 12/22/12 Assistance Needed: +1 History of Present Illness: pt adm secondary to SOB and cough. 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[employer] Communication: No difficulties         Vision/Perception     Copywriter, advertising Arousal/Alertness: Awake/alert Behavior During Therapy: WFL for tasks assessed/performed Overall Cognitive Status: Within Functional Limits for tasks assessed    Extremity/Trunk Assessment Upper Extremity Assessment Upper Extremity Assessment: Overall WFL for tasks assessed Lower Extremity Assessment Lower Extremity Assessment: Overall WFL for tasks assessed Cervical / Trunk Assessment Cervical / Trunk Assessment: Normal     Mobility Bed Mobility Bed Mobility: Supine to Sit;Sitting - Scoot to Edge of Bed;Sit to Supine Supine to Sit: 7: Independent Sitting - Scoot to Edge of Bed: 7: Independent Sit to Supine: 7: Independent Transfers Transfers:  Sit to Stand;Stand to Sit Sit to Stand: 6: Modified independent (Device/Increase time);From bed Stand to Sit: 6: Modified independent (Device/Increase time);To bed Details  for Transfer Assistance: incr time due to wheezing; no physical (A) needed     Exercise     Balance Balance Balance Assessed: Yes Static Sitting Balance Static Sitting - Balance Support: No upper extremity supported;Feet supported Static Sitting - Level of Assistance: 7: Independent Static Sitting - Comment/# of Minutes: 5 min    End of Session OT - End of Session Equipment Utilized During Treatment: Oxygen Activity Tolerance: Patient tolerated treatment well Patient left: in bed;with call bell/phone within reach  GO    12/22/2012 Cipriano Mile OTR/L Pager [phone] Office [phone]  Cipriano Mile 12/22/2012, 3:12 PM

## 2012-12-22 NOTE — Plan of Care (Signed)
Problem: ICU Phase Progression Outcomes Goal: Pain controlled with appropriate interventions Outcome: Completed/Met Date Met:  12/22/12 Pt has had no complaints of pain

## 2012-12-22 NOTE — Plan of Care (Signed)
Problem: Consults Goal: Respiratory Therapy Consult Outcome: Completed/Met Date Met:  12/22/12 Respiratory therapy following pt

## 2012-12-23 LAB — BASIC METABOLIC PANEL
Calcium: 9.4 mg/dL (ref 8.4–10.5)
Chloride: 97 mEq/L (ref 96–112)
GFR calc Af Amer: >90 mL/min (ref >90)
GFR calc non Af Amer: 86 mL/min — ABNORMAL LOW (ref >90)
Potassium: 3.7 mEq/L (ref 3.5–5.1)

## 2012-12-23 MED ORDER — PREDNISONE (PAK) 10 MG PO TABS: ORAL_TABLET | ORAL | Status: DC

## 2012-12-23 MED ORDER — LEVOFLOXACIN 750 MG PO TABS: 750.0000 mg | ORAL_TABLET | Freq: Every day | ORAL | Status: DC

## 2012-12-23 NOTE — Progress Notes (Signed)
[name] [name]. to be D/C'd Home per MD order. Discussed with the patient and all questions fully answered.    Medication List    STOP taking these medications       azithromycin 250 MG tablet  Commonly known as:  ZITHROMAX     predniSONE 10 MG tablet  Commonly known as:  DELTASONE  Replaced by:  predniSONE 10 MG tablet      TAKE these medications       albuterol 108 (90 BASE) MCG/ACT inhaler  Commonly known as:  PROVENTIL HFA;VENTOLIN HFA  Inhale 1 puff into the lungs every 6 (six) hours as needed for wheezing or shortness of breath.     albuterol 0.63 MG/3ML nebulizer solution  Commonly known as:  ACCUNEB  Take 1 ampule by nebulization every 6 (six) hours as needed for wheezing.     albuterol-ipratropium 18-103 MCG/ACT inhaler  Commonly known as:  COMBIVENT  Inhale 1 puff into the lungs every 4 (four) hours.     Fluticasone-Salmeterol 250-50 MCG/DOSE Aepb  Commonly known as:  ADVAIR DISKUS  INHALE 1 PUFF INTO THE LUNGS TWICE A DAY     levofloxacin 750 MG tablet  Commonly known as:  LEVAQUIN  Take 1 tablet (750 mg total) by mouth daily.     predniSONE 10 MG tablet  Commonly known as:  STERAPRED UNI-PAK  Take 6-5-4-3-2-1 tablet PO daily till gone.     SPIRIVA HANDIHALER 18 MCG inhalation capsule  Generic drug:  tiotropium  PLACE 1 CAPSULE (18 MCG TOTAL) INTO INHALER AND INHALE DAILY.     Vitamin D-3 5000 UNITS Tabs  Take 1 capsule by mouth daily.        VVS, Skin clean, dry and intact without evidence of skin break down, no evidence of skin tears noted.  IV catheter discontinued intact. Site without signs and symptoms of complications. Dressing and pressure applied.  An After Visit Summary was printed and given to the patient.  Patient escorted via WC, and D/C home via private auto.  Beckey Downing F  12/23/2012 11:49 AM

## 2012-12-23 NOTE — Plan of Care (Signed)
Problem: Food- and Nutrition-Related Knowledge Deficit (NB-1.1) Goal: Nutrition education Formal process to instruct or train a patient/client in a skill or to impart knowledge to help patients/clients voluntarily manage or modify food choices and eating behavior to maintain or improve health.  Outcome: Completed/Met Date Met:  12/23/12  Nutrition Education Note  RD consulted via COPD GOLD protocol. Pt is currently eating well and has stable weight.  RD provided "COPD Nutrition Therapy" handout from the Academy of [employer]etics. Reviewed patient's dietary recall. Provided examples on ways to increase caloric density of foods and beverages frequently consumed by the patient. Also provided ideas to promote variety and to incorporate additional nutrient dense foods into patient's diet. Discussed eating small frequent meals and snacks to assist in increasing overall po intake. Pt states that he sometimes consumes [employer]-brand oral nutrition supplements. Teach back method used.  Expect good compliance.  Body mass index is 24.17 kg/(m^2). Pt meets criteria for normal weight based on current BMI.  Current diet order is Regular, patient is consuming approximately 100% of meals at this time. Labs and medications reviewed. No further nutrition interventions warranted at this time. RD contact information provided. If additional nutrition issues arise, please re-consult RD.   [name] Motto MS, RD, LDN Pager: [phone] After-hours pager: [phone]

## 2012-12-23 NOTE — Discharge Summary (Signed)
Physician Discharge Summary  [name] [name]. HYQ:[id number] DOB: 1940-12-16 DOA: 12/21/2012  PCP: [name] Garibaldi, NP  Admit date: 12/21/2012 Discharge date: 12/23/2012  Time spent: 40 minutes  Recommendations for Outpatient Follow-up:  1. Followup with primary care physician within one week.  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 is a 72 y.o. male  Past oral history of COPD and hypertension who is usually well-controlled. He's not had a hospitalized exacerbation in several years. For the most part, his breathing is well controlled and he does not have too many flareups. A few days ago, he woke up with some sudden onset severe projectile vomiting. The following day he noticed to be more labored breathing with wheezing and cough. He suspected he may have had some aspiration of vomitus. He went to see his PCP who put him on by mouth steroids and antibiotics and gave him a breathing treatment in the office. Patient's symptoms persisted so he came into the emergency room today for further evaluation. In the emergency room, patient was noted to have a minimally elevated BNP of 504 and a white count of 14.8 with a 95% shift. Chest x-ray noted chronic COPD but no acute infiltrate. Patient was noted to be hypoxic with oxygen saturations in the middle 80s on room. He was put on 2 L nasal cannula and a blood gas was checked. PH is 97.43 PCO2 of 32 and a PO2 of 81. Patient's other lab work was unremarkable. Patient received breathing treatment which did improve his overall symptoms, however oxygen saturations dropped when supplemental oxygen was removed. Hospitalists were called for further  evaluation and admission.  Hospital Course:   1. Acute COPD exacerbation: Patient given to the hospital with wheezes, expiratory rhonchi, suggesting also acute bronchitis along with COPD exacerbation. Patient started on azithromycin and ceftriaxone upon admission to the hospital. Patient had stage III COPD with FEV1 of 40%. Started on IV antibiotics, IV systemic steroids, bronchodilators, and oxygen as needed. Supportive management with antitussives and mucolytics. Patient did very well and discharge on Levaquin for 5 more days and prednisone taper.  2. Hypertension: History of hypertension, patient has not taken any blood pressure medication for now. Blood pressure is reasonable.  3. GERD: No complaints.  Procedures:  None  Consultations:  None  Discharge Exam: Filed Vitals:   12/23/12 0520  BP: 123/70  Pulse: 77  Temp: 98.1 F (36.7 C)  Resp: 20   General: Alert and awake, oriented x3, not in any acute distress. HEENT: anicteric sclera, pupils reactive to light and accommodation, EOMI CVS: S1-S2 clear, no murmur rubs or gallops Chest: clear to auscultation bilaterally, no wheezing, rales or rhonchi Abdomen: soft nontender, nondistended, normal bowel sounds, no organomegaly Extremities: no cyanosis, clubbing or edema noted bilaterally Neuro: Cranial nerves II-XII intact, no focal neurological deficits  Discharge Instructions  Discharge Orders   Future Orders Complete By Expires   Increase activity slowly  As directed        Medication List    STOP taking these medications       azithromycin 250 MG tablet  Commonly known as:  ZITHROMAX     predniSONE 10 MG tablet  Commonly  known as:  DELTASONE  Replaced by:  predniSONE 10 MG tablet      TAKE these medications       albuterol 108 (90 BASE) MCG/ACT inhaler  Commonly known as:  PROVENTIL HFA;VENTOLIN HFA  Inhale 1 puff into the lungs every 6 (six) hours as needed for wheezing or shortness of breath.      albuterol 0.63 MG/3ML nebulizer solution  Commonly known as:  ACCUNEB  Take 1 ampule by nebulization every 6 (six) hours as needed for wheezing.     albuterol-ipratropium 18-103 MCG/ACT inhaler  Commonly known as:  COMBIVENT  Inhale 1 puff into the lungs every 4 (four) hours.     Fluticasone-Salmeterol 250-50 MCG/DOSE Aepb  Commonly known as:  ADVAIR DISKUS  INHALE 1 PUFF INTO THE LUNGS TWICE A DAY     levofloxacin 750 MG tablet  Commonly known as:  LEVAQUIN  Take 1 tablet (750 mg total) by mouth daily.     predniSONE 10 MG tablet  Commonly known as:  STERAPRED UNI-PAK  Take 6-5-4-3-2-1 tablet PO daily till gone.     SPIRIVA HANDIHALER 18 MCG inhalation capsule  Generic drug:  tiotropium  PLACE 1 CAPSULE (18 MCG TOTAL) INTO INHALER AND INHALE DAILY.     Vitamin D-3 5000 UNITS Tabs  Take 1 capsule by mouth daily.       Allergies  Allergen Reactions  . Codeine     unknown       Follow-up Information   Follow up with Millsaps, [name] Millin, NP In 1 week.   Contact information:   [facility] [address] [place] [address] [phone]        The results of significant diagnostics from this hospitalization (including imaging, microbiology, ancillary and laboratory) are listed below for reference.    Significant Diagnostic Studies: Dg Chest Portable 1 View  12/21/2012   CLINICAL DATA:  Shortness of breath, COPD, smoker  EXAM: PORTABLE CHEST - 1 VIEW  COMPARISON:  03/12/2012  FINDINGS: Chronic background COPD/ emphysema with hyperinflation and parenchymal scarring. Normal heart size and vascularity. No significant interval change, edema, or pneumonia. No effusion or pneumothorax. Trachea midline.  IMPRESSION: Stable COPD/emphysema.  No superimposed acute process   Electronically Signed   By: Ruel Favors M.D.   On: 12/21/2012 11:55    Microbiology: No results found for this or any previous visit (from the past 240 hour(s)).   Labs: Basic  Metabolic Panel:  Recent Labs Lab 12/21/12 1055 12/22/12 0615 12/23/12 0710  NA 137 137 138  K 3.4* 3.2* 3.7  CL 97 98 97  CO2 22 28 32  GLUCOSE 133* 126* 126*  BUN 18 20 20   CREATININE 0.97 0.93 0.84  CALCIUM 9.4 8.9 9.4   Liver Function Tests:  Recent Labs Lab 12/21/12 1055  AST 52*  ALT 16  ALKPHOS 65  BILITOT <0.1*  PROT 8.0  ALBUMIN 4.4    Recent Labs Lab 12/21/12 1055  LIPASE 15   No results found for this basename: AMMONIA,  in the last 168 hours CBC:  Recent Labs Lab 12/21/12 1055 12/22/12 0615  WBC 14.8* 10.0  NEUTROABS 14.0*  --   HGB 15.3 12.7*  HCT 44.0 38.6*  MCV 92.2 94.1  PLT 323 263   Cardiac Enzymes: No results found for this basename: CKTOTAL, CKMB, CKMBINDEX, TROPONINI,  in the last 168 hours BNP: BNP (last 3 results)  Recent Labs  12/21/12 1055  PROBNP 504.2*  CBG: No results found for this basename: GLUCAP,  in the last 168 hours     Signed:  Terrill Alperin A  [facility] 12/23/2012, 9:53 AM

## 2012-12-23 NOTE — Progress Notes (Signed)
SATURATION QUALIFICATIONS: (This note is used to comply with regulatory documentation for home oxygen)  Patient Saturations on Room Air at Rest = 93%  Patient Saturations on Room Air while Ambulating = 83%  Patient Saturations on 2 Liters of oxygen while Ambulating = 93%  Please briefly explain why patient needs home oxygen: 

## 2012-12-23 NOTE — Plan of Care (Signed)
Problem: Phase I Progression Outcomes Goal: Pain controlled Outcome: Completed/Met Date Met:  12/23/12 Pt has had no complaints of pain this shift

## 2012-12-24 NOTE — Care Management Note (Signed)
    Page 1 of 1   12/24/2012     3:51:56 PM   CARE MANAGEMENT NOTE 12/24/2012  Patient:  TYMEER, VAQUERA   Account Number:  [id number]  Date Initiated:  12/24/2012  Documentation initiated by:  Letha Cape  Subjective/Objective Assessment:   dx copd  admit- lives alone.     Action/Plan:   Anticipated DC Date:  12/23/2012   Anticipated DC Plan:  HOME/SELF CARE      DC Planning Services  CM consult      PAC Choice  DURABLE MEDICAL EQUIPMENT   Choice offered to / List presented to:  C-1 Patient   DME arranged  [facility]      DME agency  [facility]        Status of service:  Completed, signed off Medicare Important Message given?   (If response is "NO", the following Medicare IM given date fields will be blank) Date Medicare IM given:   Date Additional Medicare IM given:    Discharge Disposition:  HOME/SELF CARE  Per UR Regulation:  Reviewed for med. necessity/level of care/duration of stay  If discussed at Long Length of Stay Meetings, dates discussed:    Comments:  12/24/12 15:48 Letha Cape RN,BSN [phone] patient lives alone, pta indep.  Patient will need home oxygen at dc, [place] notified with [facility].  [facility] notified for copd gold.

## 2013-03-16 DIAGNOSIS — J189 Pneumonia, unspecified organism: Secondary | ICD-10-CM | POA: Diagnosis not present

## 2013-04-09 ENCOUNTER — Encounter: Payer: Self-pay | Admitting: Adult Health

## 2013-04-09 ENCOUNTER — Ambulatory Visit (INDEPENDENT_AMBULATORY_CARE_PROVIDER_SITE_OTHER): Payer: Medicare Other | Admitting: Adult Health

## 2013-04-09 VITALS — BP 138/76 | HR 90 | Temp 98.1°F | Ht 67.0 in | Wt 168.6 lb

## 2013-04-09 DIAGNOSIS — J449 Chronic obstructive pulmonary disease, unspecified: Secondary | ICD-10-CM | POA: Diagnosis not present

## 2013-04-09 NOTE — Patient Instructions (Signed)
Continue on current regimen .  Keep up good work . Return in 6 months with Dr. Delford FieldWright  And As needed

## 2013-04-09 NOTE — Progress Notes (Signed)
Subjective:    Patient ID: [name] MorningEddie [place] Jr., male    DOB: 08-13-1940, 73 y.o.   MRN: [id number]  HPI   73 y.o.  WM with COPD Golds Stage III.   10/02/2012 Chief Complaint  Patient presents with  . 6 month follow up    Was treated by PCP for pna in May.  C/o increased SOB over the past few days - believes this is d/t being bloated.  Occas cough with very little clear mucus.  No wheezing, chest tightness, or chest pain.  No issues recently. Notes min cough.  Mucus is clear.  No real wheeze.  Dyspnea about the same.   Pt denies any significant sore throat, nasal congestion or excess secretions, fever, chills, sweats, unintended weight loss, pleurtic or exertional chest pain, orthopnea PND, or leg swelling >>   CAT Score 10/02/2012 03/12/2012  Total CAT Score 6 8     Past Medical History  Diagnosis Date  . COPD (chronic obstructive pulmonary disease)   . Chronic bronchitis   . Pneumonia   . Hypertension      04/09/2013 Follow up COPD  Pt returns for follow up for COPD .   Remains on Spiriva and Advair.  Remains active -Volunteers at school and has tractor at home .  Does feel this cold winter makes his breathing worse at times. No flare in cough or wheezing  No fever, chest pain, orthopnea or edema.  No increased SABA use.   Had 1 admission since last ov . Admitted 12/2012 for COPD flare . Tx w/ IV abx , steroids and nebs. Resolved with treatment. CXR w/ copd changes. No acute process noted.     Family History  Problem Relation Age of Onset  . Breast cancer Mother      History   Social History  . Marital Status: Married    Spouse Name: N/A    Number of Children: N/A  . Years of Education: N/A   Occupational History  .      [employer]   Social History Main Topics  . Smoking status: Former Smoker -- 1.00 packs/day for 10 years    Types: Cigarettes    Quit date: 02/06/1998  . Smokeless tobacco: Never Used  . Alcohol Use: No  . Drug Use: No  . Sexual  Activity: Not on file   Other Topics Concern  . Not on file   Social History Narrative  . No narrative on file     Allergies  Allergen Reactions  . Codeine     unknown     Outpatient Prescriptions Prior to Visit  Medication Sig Dispense Refill  . albuterol (ACCUNEB) 0.63 MG/3ML nebulizer solution Take 1 ampule by nebulization every 6 (six) hours as needed for wheezing.      Marland Kitchen. albuterol (PROVENTIL HFA;VENTOLIN HFA) 108 (90 BASE) MCG/ACT inhaler Inhale 1 puff into the lungs every 6 (six) hours as needed for wheezing or shortness of breath.      Marland Kitchen. albuterol-ipratropium (COMBIVENT) 18-103 MCG/ACT inhaler Inhale 1 puff into the lungs every 4 (four) hours.      . Cholecalciferol (VITAMIN D-3) 5000 UNITS TABS Take 1 capsule by mouth daily.      . Fluticasone-Salmeterol (ADVAIR DISKUS) 250-50 MCG/DOSE AEPB INHALE 1 PUFF INTO THE LUNGS TWICE A DAY  60 each  11  . SPIRIVA HANDIHALER 18 MCG inhalation capsule PLACE 1 CAPSULE (18 MCG TOTAL) INTO INHALER AND INHALE DAILY.  30 capsule  6  .  levofloxacin (LEVAQUIN) 750 MG tablet Take 1 tablet (750 mg total) by mouth daily.  5 tablet  0  . predniSONE (STERAPRED UNI-PAK) 10 MG tablet Take 6-5-4-3-2-1 tablet PO daily till gone.  21 tablet  0   No facility-administered medications prior to visit.      Review of Systems  Constitutional:   No  weight loss, night sweats,  Fevers, chills, fatigue, lassitude. HEENT:   No headaches,  Difficulty swallowing,  Tooth/dental problems,  Sore throat,                No sneezing, itching, ear ache, nasal congestion, post nasal drip,   CV:  No chest pain,  Orthopnea, PND, swelling in lower extremities, anasarca, dizziness, palpitations  GI  No heartburn, indigestion, abdominal pain, nausea, vomiting, diarrhea, change in bowel habits, loss of appetite  Resp: Notes  shortness of breath with exertion not   at rest.   No chest wall deformity  Skin: no rash or lesions.  GU: no dysuria, change in color of urine,  no urgency or frequency.  No flank pain.  MS:  No joint pain or swelling.  No decreased range of motion.  No back pain.  Psych:  No change in mood or affect. No depression or anxiety.  No memory loss.     Objective:   Physical Exam  Filed Vitals:   04/09/13 0915  BP: 138/76  Pulse: 90  Temp: 98.1 F (36.7 C)  TempSrc: Oral  Height: 5\' 7"  (1.702 m)  Weight: 168 lb 9.6 oz (76.476 kg)  SpO2: 94%    Gen: Pleasant, , in no distress,  normal affect  ENT: No lesions,  mouth clear,  oropharynx clear, no postnasal drip  Neck: No JVD, no TMG, no carotid bruits  Lungs: No use of accessory muscles, no dullness to percussion, diminished BS in bases   Cardiovascular: RRR, heart sounds normal, no murmur or gallops, no peripheral edema  Abdomen: soft and NT, no HSM,  BS normal  Musculoskeletal: No deformities, no cyanosis or clubbing  Neuro: alert, non focal  Skin: Warm, no lesions or rashes        Assessment & Plan:   No problem-specific assessment & plan notes found for this encounter.   Updated Medication List Outpatient Encounter Prescriptions as of 04/09/2013  Medication Sig  . albuterol (ACCUNEB) 0.63 MG/3ML nebulizer solution Take 1 ampule by nebulization every 6 (six) hours as needed for wheezing.  Marland Kitchen albuterol (PROVENTIL HFA;VENTOLIN HFA) 108 (90 BASE) MCG/ACT inhaler Inhale 1 puff into the lungs every 6 (six) hours as needed for wheezing or shortness of breath.  Marland Kitchen albuterol-ipratropium (COMBIVENT) 18-103 MCG/ACT inhaler Inhale 1 puff into the lungs every 4 (four) hours.  . Cholecalciferol (VITAMIN D-3) 5000 UNITS TABS Take 1 capsule by mouth daily.  . Fluticasone-Salmeterol (ADVAIR DISKUS) 250-50 MCG/DOSE AEPB INHALE 1 PUFF INTO THE LUNGS TWICE A DAY  . SPIRIVA HANDIHALER 18 MCG inhalation capsule PLACE 1 CAPSULE (18 MCG TOTAL) INTO INHALER AND INHALE DAILY.  . [DISCONTINUED] levofloxacin (LEVAQUIN) 750 MG tablet Take 1 tablet (750 mg total) by mouth daily.  .  [DISCONTINUED] predniSONE (STERAPRED UNI-PAK) 10 MG tablet Take 6-5-4-3-2-1 tablet PO daily till gone.

## 2013-04-10 ENCOUNTER — Ambulatory Visit: Payer: BC Managed Care – PPO | Admitting: Critical Care Medicine

## 2013-04-14 NOTE — Assessment & Plan Note (Signed)
Compensated on present regimen   Plan  Cont on current regimen  follow up 6 months w/ Dr. Delford FieldWright

## 2013-07-14 DIAGNOSIS — J441 Chronic obstructive pulmonary disease with (acute) exacerbation: Secondary | ICD-10-CM | POA: Diagnosis not present

## 2013-07-14 DIAGNOSIS — J40 Bronchitis, not specified as acute or chronic: Secondary | ICD-10-CM | POA: Diagnosis not present

## 2013-07-17 ENCOUNTER — Inpatient Hospital Stay (HOSPITAL_COMMUNITY)
Admission: EM | Admit: 2013-07-17 | Discharge: 2013-07-19 | DRG: 191 | Disposition: A | Discharge status: Home / Self Care | Payer: Medicare Other | Attending: Internal Medicine | Admitting: Internal Medicine

## 2013-07-17 ENCOUNTER — Encounter (HOSPITAL_COMMUNITY): Payer: Self-pay | Admitting: Emergency Medicine

## 2013-07-17 ENCOUNTER — Emergency Department (HOSPITAL_COMMUNITY): Payer: Medicare Other

## 2013-07-17 DIAGNOSIS — X58XXXA Exposure to other specified factors, initial encounter: Secondary | ICD-10-CM

## 2013-07-17 DIAGNOSIS — K219 Gastro-esophageal reflux disease without esophagitis: Secondary | ICD-10-CM | POA: Diagnosis present

## 2013-07-17 DIAGNOSIS — Z87891 Personal history of nicotine dependence: Secondary | ICD-10-CM | POA: Diagnosis not present

## 2013-07-17 DIAGNOSIS — I10 Essential (primary) hypertension: Secondary | ICD-10-CM | POA: Diagnosis present

## 2013-07-17 DIAGNOSIS — R0602 Shortness of breath: Secondary | ICD-10-CM | POA: Diagnosis not present

## 2013-07-17 DIAGNOSIS — Z86718 Personal history of other venous thrombosis and embolism: Secondary | ICD-10-CM

## 2013-07-17 DIAGNOSIS — Z79899 Other long term (current) drug therapy: Secondary | ICD-10-CM

## 2013-07-17 DIAGNOSIS — Z9981 Dependence on supplemental oxygen: Secondary | ICD-10-CM | POA: Diagnosis not present

## 2013-07-17 DIAGNOSIS — J438 Other emphysema: Secondary | ICD-10-CM | POA: Diagnosis not present

## 2013-07-17 DIAGNOSIS — J449 Chronic obstructive pulmonary disease, unspecified: Secondary | ICD-10-CM | POA: Diagnosis present

## 2013-07-17 DIAGNOSIS — R0902 Hypoxemia: Secondary | ICD-10-CM

## 2013-07-17 DIAGNOSIS — X503XXA Overexertion from repetitive movements, initial encounter: Secondary | ICD-10-CM | POA: Diagnosis present

## 2013-07-17 DIAGNOSIS — E876 Hypokalemia: Secondary | ICD-10-CM | POA: Diagnosis present

## 2013-07-17 DIAGNOSIS — J441 Chronic obstructive pulmonary disease with (acute) exacerbation: Secondary | ICD-10-CM | POA: Diagnosis not present

## 2013-07-17 DIAGNOSIS — S22009A Unspecified fracture of unspecified thoracic vertebra, initial encounter for closed fracture: Secondary | ICD-10-CM | POA: Diagnosis present

## 2013-07-17 HISTORY — DX: Dependence on supplemental oxygen: Z99.81

## 2013-07-17 HISTORY — DX: Acute embolism and thrombosis of unspecified deep veins of unspecified lower extremity: I82.409

## 2013-07-17 HISTORY — DX: Headache: R51

## 2013-07-17 HISTORY — DX: Migraine, unspecified, not intractable, without status migrainosus: G43.909

## 2013-07-17 HISTORY — DX: Headache, unspecified: R51.9

## 2013-07-17 HISTORY — DX: Gastro-esophageal reflux disease without esophagitis: K21.9

## 2013-07-17 LAB — BASIC METABOLIC PANEL
BUN: 22 mg/dL (ref 6–23)
CALCIUM: 9.3 mg/dL (ref 8.4–10.5)
CO2: 25 meq/L (ref 19–32)
CREATININE: 1.09 mg/dL (ref 0.50–1.35)
Chloride: 98 mEq/L (ref 96–112)
GFR, EST AFRICAN AMERICAN: 76 mL/min — AB (ref >90)
GFR, EST NON AFRICAN AMERICAN: 66 mL/min — AB (ref >90)
Glucose, Bld: 104 mg/dL — ABNORMAL HIGH (ref 70–99)
Potassium: 4 mEq/L (ref 3.7–5.3)
SODIUM: 142 meq/L (ref 137–147)

## 2013-07-17 LAB — CBC
HCT: 41.6 % (ref 39.0–52.0)
HCT: 40.1 % (ref 39.0–52.0)
Hemoglobin: 13.1 g/dL (ref 13.0–17.0)
Hemoglobin: 12.7 g/dL — ABNORMAL LOW (ref 13.0–17.0)
MCH: 30.7 pg (ref 26.0–34.0)
MCH: 30.9 pg (ref 26.0–34.0)
MCHC: 31.5 g/dL (ref 30.0–36.0)
MCHC: 31.7 g/dL (ref 30.0–36.0)
MCV: 97.4 fL (ref 78.0–100.0)
MCV: 97.6 fL (ref 78.0–100.0)
PLATELETS: 250 10*3/uL (ref 150–400)
Platelets: 260 10*3/uL (ref 150–400)
RBC: 4.27 MIL/uL (ref 4.22–5.81)
RBC: 4.11 MIL/uL — ABNORMAL LOW (ref 4.22–5.81)
RDW: 14.1 % (ref 11.5–15.5)
RDW: 14.1 % (ref 11.5–15.5)
WBC: 11.1 10*3/uL — ABNORMAL HIGH (ref 4.0–10.5)
WBC: 9.2 10*3/uL (ref 4.0–10.5)

## 2013-07-17 LAB — CREATININE, SERUM
CREATININE: 1.06 mg/dL (ref 0.50–1.35)
GFR calc non Af Amer: 68 mL/min — ABNORMAL LOW (ref >90)
GFR, EST AFRICAN AMERICAN: 79 mL/min — AB (ref >90)

## 2013-07-17 LAB — PRO B NATRIURETIC PEPTIDE: PRO B NATRI PEPTIDE: 296.9 pg/mL — AB (ref 0–125)

## 2013-07-17 LAB — TROPONIN I
Troponin I: <0.3 ng/mL (ref <0.30)
Troponin I: <0.3 ng/mL (ref <0.30)

## 2013-07-17 LAB — I-STAT TROPONIN, ED: Troponin i, poc: 0.01 ng/mL (ref 0.00–0.08)

## 2013-07-17 LAB — D-DIMER, QUANTITATIVE (NOT AT ARMC): D DIMER QUANT: 0.39 ug{FEU}/mL (ref 0.00–0.48)

## 2013-07-17 MED ORDER — GUAIFENESIN ER 600 MG PO TB12
1200.0000 mg | ORAL_TABLET | Freq: Two times a day (BID) | ORAL | Status: DC | PRN
  Administered 2013-07-17 – 2013-07-19 (×3): 1200 mg via ORAL
  Filled 2013-07-17 (×3): qty 2

## 2013-07-17 MED ORDER — LEVOFLOXACIN IN D5W 500 MG/100ML IV SOLN
500.0000 mg | Freq: Once | INTRAVENOUS | Status: AC
  Administered 2013-07-17: 500 mg via INTRAVENOUS
  Filled 2013-07-17: qty 100

## 2013-07-17 MED ORDER — METHYLPREDNISOLONE SODIUM SUCC 125 MG IJ SOLR
125.0000 mg | Freq: Once | INTRAMUSCULAR | Status: AC
  Administered 2013-07-17: 125 mg via INTRAVENOUS
  Filled 2013-07-17: qty 2

## 2013-07-17 MED ORDER — LEVOFLOXACIN IN D5W 750 MG/150ML IV SOLN: 750.0000 mg | Freq: Once | INTRAVENOUS | Status: DC

## 2013-07-17 MED ORDER — SODIUM CHLORIDE 0.9 % IJ SOLN
3.0000 mL | Freq: Two times a day (BID) | INTRAMUSCULAR | Status: DC
  Administered 2013-07-17 – 2013-07-18 (×4): 3 mL via INTRAVENOUS

## 2013-07-17 MED ORDER — HYDROCHLOROTHIAZIDE 25 MG PO TABS
25.0000 mg | ORAL_TABLET | Freq: Every day | ORAL | Status: DC
  Administered 2013-07-17 – 2013-07-19 (×3): 25 mg via ORAL
  Filled 2013-07-17 (×3): qty 1

## 2013-07-17 MED ORDER — ALBUTEROL SULFATE (2.5 MG/3ML) 0.083% IN NEBU
10.0000 mg | INHALATION_SOLUTION | Freq: Once | RESPIRATORY_TRACT | Status: DC
  Filled 2013-07-17: qty 12

## 2013-07-17 MED ORDER — LEVOFLOXACIN IN D5W 750 MG/150ML IV SOLN
750.0000 mg | INTRAVENOUS | Status: DC
  Administered 2013-07-18 – 2013-07-19 (×2): 750 mg via INTRAVENOUS
  Filled 2013-07-17 (×2): qty 150

## 2013-07-17 MED ORDER — IPRATROPIUM-ALBUTEROL 0.5-2.5 (3) MG/3ML IN SOLN: 3.0000 mL | RESPIRATORY_TRACT | Status: DC | PRN

## 2013-07-17 MED ORDER — ALBUTEROL (5 MG/ML) CONTINUOUS INHALATION SOLN
10.0000 mg/h | INHALATION_SOLUTION | RESPIRATORY_TRACT | Status: DC
  Administered 2013-07-17: 10 mg/h via RESPIRATORY_TRACT

## 2013-07-17 MED ORDER — ACETAMINOPHEN 650 MG RE SUPP: 650.0000 mg | Freq: Four times a day (QID) | RECTAL | Status: DC | PRN

## 2013-07-17 MED ORDER — HEPARIN SODIUM (PORCINE) 5000 UNIT/ML IJ SOLN
5000.0000 [IU] | Freq: Three times a day (TID) | INTRAMUSCULAR | Status: DC
  Administered 2013-07-17 – 2013-07-19 (×6): 5000 [IU] via SUBCUTANEOUS
  Filled 2013-07-17 (×8): qty 1

## 2013-07-17 MED ORDER — METHYLPREDNISOLONE SODIUM SUCC 125 MG IJ SOLR
60.0000 mg | Freq: Four times a day (QID) | INTRAMUSCULAR | Status: DC
  Administered 2013-07-17 – 2013-07-18 (×4): 60 mg via INTRAVENOUS
  Filled 2013-07-17 (×5): qty 0.96
  Filled 2013-07-17: qty 2
  Filled 2013-07-17 (×2): qty 0.96

## 2013-07-17 MED ORDER — IPRATROPIUM BROMIDE 0.02 % IN SOLN
0.5000 mg | Freq: Once | RESPIRATORY_TRACT | Status: AC
  Administered 2013-07-17: 0.5 mg via RESPIRATORY_TRACT
  Filled 2013-07-17: qty 2.5

## 2013-07-17 MED ORDER — BIOTENE DRY MOUTH MT LIQD
15.0000 mL | Freq: Two times a day (BID) | OROMUCOSAL | Status: DC
  Administered 2013-07-17 – 2013-07-19 (×4): 15 mL via OROMUCOSAL

## 2013-07-17 MED ORDER — ONDANSETRON HCL 4 MG/2ML IJ SOLN: 4.0000 mg | Freq: Four times a day (QID) | INTRAMUSCULAR | Status: DC | PRN

## 2013-07-17 MED ORDER — IPRATROPIUM-ALBUTEROL 0.5-2.5 (3) MG/3ML IN SOLN
3.0000 mL | RESPIRATORY_TRACT | Status: DC
  Administered 2013-07-17 – 2013-07-18 (×5): 3 mL via RESPIRATORY_TRACT
  Filled 2013-07-17 (×6): qty 3

## 2013-07-17 MED ORDER — ACETAMINOPHEN 325 MG PO TABS
650.0000 mg | ORAL_TABLET | Freq: Four times a day (QID) | ORAL | Status: DC | PRN
  Administered 2013-07-18 – 2013-07-19 (×2): 650 mg via ORAL
  Filled 2013-07-17 (×2): qty 2

## 2013-07-17 MED ORDER — ONDANSETRON HCL 4 MG PO TABS
4.0000 mg | ORAL_TABLET | Freq: Four times a day (QID) | ORAL | Status: DC | PRN
Start: 2013-07-17 — End: 2013-07-19

## 2013-07-17 NOTE — ED Provider Notes (Signed)
CSN: [id number]     Arrival date & time 07/17/13  1022 History   First MD Initiated Contact with Patient 07/17/13 1039     Chief Complaint  Patient presents with  . Shortness of Breath     (Consider location/radiation/quality/duration/timing/severity/associated sxs/prior Treatment) Patient is a 73 y.o. male presenting with shortness of breath.  Shortness of Breath Associated symptoms: abdominal pain, chest pain and cough   Associated symptoms: no fever and no wheezing     73 year old male with a history of oxygen-dependent COPD who presents with worsening shortness of breath, after having been treated for several days as an outpatient with Z-Pak and prednisone. Patient states that on Sunday he began to feel short of breath, and on Monday went to his PCP who did a chest x-ray which apparently showed a pneumonia. He was started on azithromycin and prednisone daily. He is not gotten any better, and now complaints he is actually feeling worse. He's been doing albuterol nebulization treatments at home every 4 hours, but yesterday afternoon started to feel worse. He's had chest pain in his midsternal area and also in between her shoulder blades in the back. He has not heard himself wheezing very much, and has been coughing sporadically. He has not produced any blood-tinged sputum. Has not had any fevers. Has had mild decreased by mouth intake, but no dysuria. Has had mild abdominal pain, which she states is chronic from his medications. He has not noticed any lower extremity edema. Today he returned his PCP for reevaluation, and reportedly desaturated to 85% on ambulating into the office while on his home 2 L.  Past Medical History  Diagnosis Date  . COPD (chronic obstructive pulmonary disease)   . Chronic bronchitis   . Pneumonia   . Hypertension    Past Surgical History  Procedure Laterality Date  . Tonsillectomy    . 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 Breast cancer Mother    History  Substance Use Topics  . Smoking status: Former Smoker -- 1.00 packs/day for 10 years    Types: Cigarettes    Quit date: 02/06/1998  . Smokeless tobacco: Never Used  . Alcohol Use: No    Review of Systems  Constitutional: Positive for appetite change. Negative for fever.  Respiratory: Positive for cough, chest tightness and shortness of breath. Negative for wheezing.   Cardiovascular: Positive for chest pain. Negative for leg swelling.  Gastrointestinal: Positive for abdominal pain.  Genitourinary: Negative for dysuria.  All other systems reviewed and are negative.     Allergies  Codeine  Home Medications   Prior to Admission medications   Medication Sig Start Date End Date Taking? Authorizing Provider  albuterol (ACCUNEB) 0.63 MG/3ML nebulizer solution Take 1 ampule by nebulization every 6 (six) hours as needed for wheezing.   Yes Historical Provider, MD  albuterol (PROVENTIL HFA;VENTOLIN HFA) 108 (90 BASE) MCG/ACT inhaler Inhale 2 puffs into the lungs every 6 (six) hours as needed for wheezing or shortness of breath.    Yes Historical Provider, MD  Calcium Carbonate Antacid (ANTACID PO) Take 1 tablet by mouth daily as needed (acid reflux).   Yes Historical Provider, MD  Cholecalciferol (VITAMIN D-3) 5000 UNITS TABS Take 5,000 Units by mouth daily.    Yes Historical Provider, MD  Fluticasone-Salmeterol (ADVAIR) 250-50 MCG/DOSE AEPB Inhale 1 puff into the lungs 2 (two) times daily.   Yes Historical Provider, MD  hydrochlorothiazide (  HYDRODIURIL) 25 MG tablet Take 25 mg by mouth daily.   Yes Historical Provider, MD  tiotropium (SPIRIVA) 18 MCG inhalation capsule Place 18 mcg into inhaler and inhale daily.   Yes Historical Provider, MD  vitamin C (ASCORBIC ACID) 500 MG tablet Take 500 mg by mouth daily.   Yes Historical Provider, MD  VITAMIN E PO Take 1 tablet by mouth daily.   Yes Historical Provider, MD   albuterol-ipratropium (COMBIVENT) 18-103 MCG/ACT inhaler Inhale 1 puff into the lungs every 4 (four) hours.    Historical Provider, MD   BP 144/69  Pulse 89  Temp(Src) 97.3 F (36.3 C) (Oral)  Resp 25  SpO2 94% Physical Exam  Constitutional: He is oriented to person, place, and time. He appears well-developed and well-nourished.  HENT:  Head: Normocephalic and atraumatic.  Mouth/Throat: Oropharynx is clear and moist.  Eyes: EOM are normal. Pupils are equal, round, and reactive to light.  Cardiovascular: Normal rate, regular rhythm and normal heart sounds.   Pulmonary/Chest: Breath sounds normal.  Tachypnea with increased work of breathing but no severe distress  Abdominal: Soft. Bowel sounds are normal. There is no tenderness. There is no rebound and no guarding.  Musculoskeletal: He exhibits no edema.  Neurological: He is alert and oriented to person, place, and time.  Skin: Skin is warm and dry. He is not diaphoretic.  Psychiatric: He has a normal mood and affect. His behavior is normal.    ED Course  Procedures (including critical care time) Labs Review Labs Reviewed  CBC - Abnormal; Notable for the following:    WBC 11.1 (*)    All other components within normal limits  BASIC METABOLIC PANEL - Abnormal; Notable for the following:    Glucose, Bld 104 (*)    GFR calc non Af Amer 66 (*)    GFR calc Af Amer 76 (*)    All other components within normal limits  D-DIMER, QUANTITATIVE  PRO B NATRIURETIC PEPTIDE  I-STAT TROPOININ, ED    Imaging Review Dg Chest 2 View  07/17/2013   CLINICAL DATA:  Shortness of breath with history of COPD and previous tobacco use.  EXAM: CHEST  2 VIEW  COMPARISON:  Portable chest x-ray of December 21, 2012. And PA and lateral chest x-ray of March 12, 2012  FINDINGS: The lungs remain hyperinflated. The interstitial markings are mildly increased bilaterally. The cardiac silhouette and mediastinal structures are normal. There is no pleural  effusion. The bony thorax is unremarkable.  IMPRESSION: There are chronic changes of COPD and previous tobacco use. There is no pneumonia nor CHF or other acute cardiopulmonary disease. Superimposed acute bronchitis is not excluded.   Electronically Signed   By: David  [place]   On: 07/17/2013 11:56     EKG Interpretation   Date/Time:  Thursday July 17 2013 10:25:30 EDT Ventricular Rate:  95 PR Interval:  144 QRS Duration: 98 QT Interval:  334 QTC Calculation: 419 R Axis:   72 Text Interpretation:  Sinus rhythm with occasional Premature ventricular  complexes Incomplete right bundle branch block Borderline ECG No  significant change was found Confirmed by CAMPOS  MD, Caryn Bee ([id number]) on  07/17/2013 11:16:46 AM      MDM   Final diagnoses:  COPD exacerbation  Hypoxia    73 year old male with a history of oxygen-dependent COPD, presenting with shortness of breath. He has failed an outpatient oral regimen, and will need to be admitted for further management. The official read on his chest x-ray  says no pneumonia, however I do think that he has an infiltrate in his left lung and this clinically fits with pneumonia. Pulmonary embolism was considered as a diagnosis, however is very unlikely with a normal d-dimer. Patient will be admitted to the hospitalist service.  Levert FeinsteinBrittany Ronne Stefanski, MD Family Medicine PGY-2     Latrelle DodrillBrittany J Donnetta Gillin, MD 07/17/13 [phone]

## 2013-07-17 NOTE — Progress Notes (Signed)
On call physician paged for mucinex per pt request.

## 2013-07-17 NOTE — H&P (Addendum)
[facility] History and Physical  Luiz Pierce CraneLashley Jr. QMV:[id number] DOB: 1940/05/20 DOA: 07/17/2013  Referring physician: Emergency Department PCP: Egbert GaribaldiMillsaps, KIMBERLY M, NP  Specialists:   Chief Complaint: SOB, wheezing  HPI: [name] Morningddie Jiggetts Jr. is a 73 y.o. male  With a hx of htn, and o2 dependent copd who presents with wheezing and sob after failure of home neb tx and antibiotics. Pt's sx began on 6/7 where he first noted mild chest tightness. Sx progressed gradually to increased wheezing and sob despite empiric course of Zpak prescribed by PCP. In the ED, pt found initially to be hypoxic with o2 sats of 85% on RA with decreased BS. Pt noted improvement following albuterol neb and one dose of IV steroids. There was question of possible infiltrate on CXR so levofloxacin was started in the ED. Given failure of outpatient tx of COPD, hospitalist service was consulted for consideration for admission.  Review of Systems: Per above, the remainder of the 10pt ros reviewed and are neg  Past Medical History  Diagnosis Date  . COPD (chronic obstructive pulmonary disease)   . Chronic bronchitis   . Pneumonia   . Hypertension    Past Surgical History  Procedure Laterality Date  . Tonsillectomy    . 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 His smoking use included Cigarettes. He has a 10 pack-year smoking history. He has never used smokeless tobacco. He reports that he does not drink alcohol or use illicit drugs.  where does patient live--home, ALF, SNF? and with whom if at home?  Can patient participate in ADLs?  Allergies  Allergen Reactions  . Codeine Other (See Comments)    Hallucinations     Family History  Problem Relation Age of Onset  . Breast cancer Mother     (be sure to complete)  Prior to Admission medications   Medication Sig Start Date End Date Taking? Authorizing Provider  albuterol (ACCUNEB)  0.63 MG/3ML nebulizer solution Take 1 ampule by nebulization every 6 (six) hours as needed for wheezing.   Yes Historical Provider, MD  albuterol (PROVENTIL HFA;VENTOLIN HFA) 108 (90 BASE) MCG/ACT inhaler Inhale 2 puffs into the lungs every 6 (six) hours as needed for wheezing or shortness of breath.    Yes Historical Provider, MD  Calcium Carbonate Antacid (ANTACID PO) Take 1 tablet by mouth daily as needed (acid reflux).   Yes Historical Provider, MD  Cholecalciferol (VITAMIN D-3) 5000 UNITS TABS Take 5,000 Units by mouth daily.    Yes Historical Provider, MD  Fluticasone-Salmeterol (ADVAIR) 250-50 MCG/DOSE AEPB Inhale 1 puff into the lungs 2 (two) times daily.   Yes Historical Provider, MD  hydrochlorothiazide (HYDRODIURIL) 25 MG tablet Take 25 mg by mouth daily.   Yes Historical Provider, MD  tiotropium (SPIRIVA) 18 MCG inhalation capsule Place 18 mcg into inhaler and inhale daily.   Yes Historical Provider, MD  vitamin C (ASCORBIC ACID) 500 MG tablet Take 500 mg by mouth daily.   Yes Historical Provider, MD  VITAMIN E PO Take 1 tablet by mouth daily.   Yes Historical Provider, MD  albuterol-ipratropium (COMBIVENT) 18-103 MCG/ACT inhaler Inhale 1 puff into the lungs every 4 (four) hours.    Historical Provider, MD   Physical Exam: Filed Vitals:   07/17/13 1028 07/17/13 1045 07/17/13 1151 07/17/13 1303  BP: 139/78 144/69 123/78   Pulse: 103 89 85   Temp: 97.3 F (36.3 C)  TempSrc: Oral     Resp: 22 25 24    SpO2: 92% 94% 97% 95%     General:  Awake, in nad  Eyes: PERRL B  ENT: membranes moist, dentition fair  Neck: trachea midline, neck supple  Cardiovascular: regular, s1, s2  Respiratory: decreased BS, increased resp effort, end-expiratory wheezing  Abdomen: soft, nondistended  Skin: normal skin turgor, no abnormal skin lesions seen  Musculoskeletal: perfused, no clubbing  Psychiatric: mood/affect normal // no auditory/visual hallucinations  Neurologic: cn2-12  grossly intact, strength/sensation intact  Labs on Admission:  Basic Metabolic Panel:  Recent Labs Lab 07/17/13 1050  NA 142  K 4.0  CL 98  CO2 25  GLUCOSE 104*  BUN 22  CREATININE 1.09  CALCIUM 9.3   Liver Function Tests: No results found for this basename: AST, ALT, ALKPHOS, BILITOT, PROT, ALBUMIN,  in the last 168 hours No results found for this basename: LIPASE, AMYLASE,  in the last 168 hours No results found for this basename: AMMONIA,  in the last 168 hours CBC:  Recent Labs Lab 07/17/13 1050  WBC 11.1*  HGB 13.1  HCT 41.6  MCV 97.4  PLT 260   Cardiac Enzymes: No results found for this basename: CKTOTAL, CKMB, CKMBINDEX, TROPONINI,  in the last 168 hours  BNP (last 3 results)  Recent Labs  12/21/12 1055  PROBNP 504.2*   CBG: No results found for this basename: GLUCAP,  in the last 168 hours  Radiological Exams on Admission: Dg Chest 2 View  07/17/2013   CLINICAL DATA:  Shortness of breath with history of COPD and previous tobacco use.  EXAM: CHEST  2 VIEW  COMPARISON:  Portable chest x-ray of December 21, 2012. And PA and lateral chest x-ray of March 12, 2012  FINDINGS: The lungs remain hyperinflated. The interstitial markings are mildly increased bilaterally. The cardiac silhouette and mediastinal structures are normal. There is no pleural effusion. The bony thorax is unremarkable.  IMPRESSION: There are chronic changes of COPD and previous tobacco use. There is no pneumonia nor CHF or other acute cardiopulmonary disease. Superimposed acute bronchitis is not excluded.   Electronically Signed   By: David  [place]   On: 07/17/2013 11:56    Assessment/Plan Principal Problem:   COPD exacerbation Active Problems:   HYPERTENSION   COPD (chronic obstructive pulmonary disease)   1. COPD exacerbation 1. Will continue on scheduled duonebs q4hrs with q2hrs PRN 2. Will cont on scheduled solumedrol 60mg  IV q6hrs 3. Given acuity of sx, will cont on empiric  levofloxacin 4. Cont on O2 as tolerated (baseline 2LNC) 5. Admit to med-tele 2. HTN 1. BP stable, controlled 2. Cont hctz 3. Chest pain 1. EKG unremarkable 2. Suspect secondary to copd exacerbation 3. Will cycle cardiac enzymes 4. Tylenol PRN for pain 4. DVT prophylaxis 1. Heparin subQ 5. Prior hx of tobacco use 1. Quit 10-78yrs ago 2. Pt congratulated  Code Status: Full Family Communication: Pt in room Disposition Plan: Pending improvement of symptoms  Time spent:  CHIU, STEPHEN K [facility] Pager [phone]  If 7PM-7AM, please contact night-coverage [email or web address] Password [facility] 07/17/2013, 2:11 PM

## 2013-07-17 NOTE — Progress Notes (Signed)
Pt admitted to the unit at 1437. Pt mental status is alert and oriented x4. Pt oriented to room, staff, and call bell. Skin is intact with no issues to address. Full assessment charted in [facility]. Call bell within reach. Visitor guidelines reviewed w/ pt and/or family.

## 2013-07-17 NOTE — ED Notes (Signed)
Pt recently seen for sob and between shoulder blade pain on Monday, placed on ZPACK.  Pt is 02 dependent and has copd.  Denies pain but states increased sob/wheezing.  Reports muffled hearing

## 2013-07-17 NOTE — Progress Notes (Signed)
ANTIBIOTIC CONSULT NOTE - INITIAL  Pharmacy Consult:  Levaquin Indication:  HCAP  Allergies  Allergen Reactions  . Codeine Other (See Comments)    Hallucinations     Patient Measurements: Height: 5' 6.93" (170 cm) Weight: 168 lb 10.4 oz (76.5 kg) IBW/kg (Calculated) : 65.94  Vital Signs: Temp: 97.3 F (36.3 C) (06/11 1028) Temp src: Oral (06/11 1028) BP: 123/78 mmHg (06/11 1151) Pulse Rate: 85 (06/11 1151)  Labs:  Recent Labs  07/17/13 1050  WBC 11.1*  HGB 13.1  PLT 260  CREATININE 1.09   Estimated Creatinine Clearance: 57.1 ml/min (by C-G formula based on Cr of 1.09). No results found for this basename: VANCOTROUGH, VANCOPEAK, VANCORANDOM, GENTTROUGH, GENTPEAK, GENTRANDOM, TOBRATROUGH, TOBRAPEAK, TOBRARND, AMIKACINPEAK, AMIKACINTROU, AMIKACIN,  in the last 72 hours   Microbiology: No results found for this or any previous visit (from the past 720 hour(s)).  Medical History: Past Medical History  Diagnosis Date  . COPD (chronic obstructive pulmonary disease)   . Chronic bronchitis   . Pneumonia   . Hypertension       Assessment: 33 YOM with history of COPD on home oxygen admitted with complaints of SOB and wheezing.  Pharmacy consulted to dose Levaquin for PNA.  Baseline labs reviewed.  Aware patient received Levaquin 500mg  IV x1 around 1330 today.   Goal of Therapy:  Clearance of infection   Plan:  - LVQ 750mg  IV Q24H, start tomorrow - Pharmacy will sign off as dosage adjustment likely unnecessary.  Thank you for the consult!    Jere Bostrom D. Laney Potash, PharmD, BCPS Pager:  [phone] 07/17/2013, 2:35 PM

## 2013-07-17 NOTE — Progress Notes (Signed)
Utilization review completed. Samuele Storey, RN, BSN. 

## 2013-07-17 NOTE — ED Provider Notes (Signed)
I saw and evaluated the patient, reviewed the resident's note and I agree with the findings and plan.   EKG Interpretation   Date/Time:  Thursday July 17 2013 10:25:30 EDT Ventricular Rate:  95 PR Interval:  144 QRS Duration: 98 QT Interval:  334 QTC Calculation: 419 R Axis:   72 Text Interpretation:  Sinus rhythm with occasional Premature ventricular  complexes Incomplete right bundle branch block Borderline ECG No  significant change was found Confirmed by Edder Bellanca  MD, Aaima Gaddie ([id number]) on  07/17/2013 11:16:46 AM      Failed outpatient treatment of COPD.  D-dimer normal.  Patient be admitted the hospital for ongoing management of his COPD.  Patient given Solu-Medrol in emergency apartment as well as milligrams albuterol 0.5 mg Atrovent.  Dg Chest 2 View  07/17/2013   CLINICAL DATA:  Shortness of breath with history of COPD and previous tobacco use.  EXAM: CHEST  2 VIEW  COMPARISON:  Portable chest x-ray of December 21, 2012. And PA and lateral chest x-ray of March 12, 2012  FINDINGS: The lungs remain hyperinflated. The interstitial markings are mildly increased bilaterally. The cardiac silhouette and mediastinal structures are normal. There is no pleural effusion. The bony thorax is unremarkable.  IMPRESSION: There are chronic changes of COPD and previous tobacco use. There is no pneumonia nor CHF or other acute cardiopulmonary disease. Superimposed acute bronchitis is not excluded.   Electronically Signed   By: David  [place]   On: 07/17/2013 11:56  I personally reviewed the imaging tests through PACS system I reviewed available ER/hospitalization records through the EMR  Results for orders placed during the hospital encounter of 07/17/13  CBC      Result Value Ref Range   WBC 11.1 (*) 4.0 - 10.5 K/uL   RBC 4.27  4.22 - 5.81 MIL/uL   Hemoglobin 13.1  13.0 - 17.0 g/dL   HCT 52.0  80.2 - 23.3 %   MCV 97.4  78.0 - 100.0 fL   MCH 30.7  26.0 - 34.0 pg   MCHC 31.5  30.0 - 36.0 g/dL   RDW  61.2  24.4 - 97.5 %   Platelets 260  150 - 400 K/uL  BASIC METABOLIC PANEL      Result Value Ref Range   Sodium 142  137 - 147 mEq/L   Potassium 4.0  3.7 - 5.3 mEq/L   Chloride 98  96 - 112 mEq/L   CO2 25  19 - 32 mEq/L   Glucose, Bld 104 (*) 70 - 99 mg/dL   BUN 22  6 - 23 mg/dL   Creatinine, Ser 3.00  0.50 - 1.35 mg/dL   Calcium 9.3  8.4 - 51.1 mg/dL   GFR calc non Af Amer 66 (*) >90 mL/min   GFR calc Af Amer 76 (*) >90 mL/min  PRO B NATRIURETIC PEPTIDE      Result Value Ref Range   Pro B Natriuretic peptide (BNP) 296.9 (*) 0 - 125 pg/mL  D-DIMER, QUANTITATIVE      Result Value Ref Range   D-Dimer, Quant 0.39  0.00 - 0.48 ug/mL-FEU  CBC      Result Value Ref Range   WBC 9.2  4.0 - 10.5 K/uL   RBC 4.11 (*) 4.22 - 5.81 MIL/uL   Hemoglobin 12.7 (*) 13.0 - 17.0 g/dL   HCT 02.1  11.7 - 35.6 %   MCV 97.6  78.0 - 100.0 fL   MCH 30.9  26.0 - 34.0 pg  MCHC 31.7  30.0 - 36.0 g/dL   RDW 16.114.1  09.611.5 - 04.515.5 %   Platelets 250  150 - 400 K/uL  CREATININE, SERUM      Result Value Ref Range   Creatinine, Ser 1.06  0.50 - 1.35 mg/dL   GFR calc non Af Amer 68 (*) >90 mL/min   GFR calc Af Amer 79 (*) >90 mL/min  TROPONIN I      Result Value Ref Range   Troponin I <0.30  <0.30 ng/mL  I-STAT TROPOININ, ED      Result Value Ref Range   Troponin i, poc 0.01  0.00 - 0.08 ng/mL   Comment 3              Lyanne CoKevin M Darcy Barbara, MD 07/17/13 1718

## 2013-07-18 ENCOUNTER — Encounter (HOSPITAL_COMMUNITY): Payer: Self-pay

## 2013-07-18 ENCOUNTER — Inpatient Hospital Stay (HOSPITAL_COMMUNITY): Payer: Medicare Other

## 2013-07-18 DIAGNOSIS — J441 Chronic obstructive pulmonary disease with (acute) exacerbation: Secondary | ICD-10-CM | POA: Diagnosis not present

## 2013-07-18 DIAGNOSIS — J438 Other emphysema: Secondary | ICD-10-CM | POA: Diagnosis not present

## 2013-07-18 LAB — CBC
HCT: 38.7 % — ABNORMAL LOW (ref 39.0–52.0)
HEMOGLOBIN: 12.3 g/dL — AB (ref 13.0–17.0)
MCH: 31 pg (ref 26.0–34.0)
MCHC: 31.8 g/dL (ref 30.0–36.0)
MCV: 97.5 fL (ref 78.0–100.0)
Platelets: 238 10*3/uL (ref 150–400)
RBC: 3.97 MIL/uL — ABNORMAL LOW (ref 4.22–5.81)
RDW: 13.8 % (ref 11.5–15.5)
WBC: 8.3 10*3/uL (ref 4.0–10.5)

## 2013-07-18 LAB — COMPREHENSIVE METABOLIC PANEL
ALT: 9 U/L (ref 0–53)
AST: 16 U/L (ref 0–37)
Albumin: 3.6 g/dL (ref 3.5–5.2)
Alkaline Phosphatase: 49 U/L (ref 39–117)
BUN: 19 mg/dL (ref 6–23)
CO2: 26 meq/L (ref 19–32)
Calcium: 9.2 mg/dL (ref 8.4–10.5)
Chloride: 97 mEq/L (ref 96–112)
Creatinine, Ser: 0.92 mg/dL (ref 0.50–1.35)
GFR, EST NON AFRICAN AMERICAN: 82 mL/min — AB (ref >90)
GLUCOSE: 164 mg/dL — AB (ref 70–99)
Potassium: 3.3 mEq/L — ABNORMAL LOW (ref 3.7–5.3)
SODIUM: 140 meq/L (ref 137–147)
Total Bilirubin: <0.2 mg/dL — ABNORMAL LOW (ref 0.3–1.2)
Total Protein: 6.5 g/dL (ref 6.0–8.3)

## 2013-07-18 LAB — TROPONIN I: Troponin I: <0.3 ng/mL (ref <0.30)

## 2013-07-18 MED ORDER — ALUM & MAG HYDROXIDE-SIMETH 200-200-20 MG/5ML PO SUSP
30.0000 mL | Freq: Four times a day (QID) | ORAL | Status: DC | PRN
  Administered 2013-07-18 (×2): 30 mL via ORAL
  Filled 2013-07-18 (×2): qty 30

## 2013-07-18 MED ORDER — METHYLPREDNISOLONE SODIUM SUCC 125 MG IJ SOLR
60.0000 mg | Freq: Two times a day (BID) | INTRAMUSCULAR | Status: DC
  Administered 2013-07-18 – 2013-07-19 (×2): 60 mg via INTRAVENOUS
  Filled 2013-07-18 (×3): qty 0.96
  Filled 2013-07-18: qty 2
  Filled 2013-07-18: qty 0.96

## 2013-07-18 MED ORDER — IOHEXOL 350 MG/ML SOLN
100.0000 mL | Freq: Once | INTRAVENOUS | Status: AC | PRN
  Administered 2013-07-18: 100 mL via INTRAVENOUS

## 2013-07-18 MED ORDER — SODIUM CHLORIDE 0.9 % IV SOLN
INTRAVENOUS | Status: AC
  Administered 2013-07-18: 10:00:00 via INTRAVENOUS

## 2013-07-18 MED ORDER — IPRATROPIUM-ALBUTEROL 0.5-2.5 (3) MG/3ML IN SOLN
3.0000 mL | Freq: Two times a day (BID) | RESPIRATORY_TRACT | Status: DC
  Administered 2013-07-18 – 2013-07-19 (×2): 3 mL via RESPIRATORY_TRACT
  Filled 2013-07-18 (×2): qty 3

## 2013-07-18 MED ORDER — IBUPROFEN 600 MG PO TABS
600.0000 mg | ORAL_TABLET | Freq: Four times a day (QID) | ORAL | Status: AC
  Administered 2013-07-18 (×2): 600 mg via ORAL
  Filled 2013-07-18 (×2): qty 1

## 2013-07-18 MED ORDER — MORPHINE SULFATE 2 MG/ML IJ SOLN
2.0000 mg | Freq: Once | INTRAMUSCULAR | Status: AC
  Administered 2013-07-18: 2 mg via INTRAVENOUS
  Filled 2013-07-18: qty 1

## 2013-07-18 MED ORDER — POTASSIUM CHLORIDE CRYS ER 20 MEQ PO TBCR
40.0000 meq | EXTENDED_RELEASE_TABLET | ORAL | Status: AC
  Administered 2013-07-18 (×2): 40 meq via ORAL
  Filled 2013-07-18 (×2): qty 2

## 2013-07-18 NOTE — Progress Notes (Addendum)
Patient Demographics  [name] [name], is a 73 y.o. male, DOB - [birth year], ZOX:[id number]  Admit date - 07/17/2013   Admitting Physician Jerald KiefStephen K Chiu, MD  Outpatient Primary MD for the patient is Millsaps, [name], NP  LOS - 1   Chief Complaint  Patient presents with  . Shortness of Breath           Subjective:   [name] HazyEddie Gimbel today has, No headache, No abdominal pain - No Nausea, No new weakness tingling or numbness, mild cough which is dry, stable shortness of breath, some pain between his shoulder blades in the back.    Assessment & Plan    COPD exacerbation - somewhat improved, continue IV steroids, nebulizer treatments, oxygen-of note Patient has home oxygen, on empiric Levaquin which will be continued.     Chest pain - atypical, located between his shoulder blades in the back, EKG troponin unremarkable, CT scan of the chest shows evolving thoracic fracture which could be contribution to his pain, will place in TLSO brace, NSAIDs and Supportive care for now.     Evolving T-spine fracture. TLSO brace, NSAIDs, supportive care. Outpatient neurosurgery followup.      HTN - BP stable, controlled, Cont hctz.     Hypokalemia. Replace and monitor.       Code Status: full  Family Communication: non  Disposition Plan: home   Procedures CTA chest   Consults     Medications  Scheduled Meds: . antiseptic oral rinse  15 mL Mouth Rinse BID  . heparin  5,000 Units Subcutaneous 3 times per day  . hydrochlorothiazide  25 mg Oral Daily  . ipratropium-albuterol  3 mL Nebulization Q4H  . levofloxacin (LEVAQUIN) IV  750 mg Intravenous Q24H  . methylPREDNISolone (SOLU-MEDROL) injection  60 mg Intravenous Q12H  . potassium chloride  40 mEq Oral Q4H  . sodium  chloride  3 mL Intravenous Q12H   Continuous Infusions: . sodium chloride 100 mL/hr at 07/18/13 0954   PRN Meds:.acetaminophen, guaiFENesin, ipratropium-albuterol, ondansetron (ZOFRAN) IV, ondansetron  DVT Prophylaxis    Heparin   Lab Results  Component Value Date   PLT 238 07/18/2013    Antibiotics      Anti-infectives   Start     Dose/Rate Route Frequency Ordered Stop   07/18/13 1000  levofloxacin (LEVAQUIN) IVPB 750 mg     750 mg 100 mL/hr over 90 Minutes Intravenous Every 24 hours 07/17/13 1437     07/17/13 1300  levofloxacin (LEVAQUIN) IVPB 750 mg  Status:  Discontinued     750 mg 100 mL/hr over 90 Minutes Intravenous  Once 07/17/13 1247 07/17/13 1248   07/17/13 1300  levofloxacin (LEVAQUIN) IVPB 500 mg     500 mg 100 mL/hr over 60 Minutes Intravenous  Once 07/17/13 1248 07/17/13 1439          Objective:   Filed Vitals:   07/17/13 2001 07/18/13 0014 07/18/13 0558 07/18/13 0913  BP: 145/57  111/69   Pulse: 98  74 87  Temp: 97.6 F (36.4 C)  97.8 F (36.6 C)   TempSrc: Oral  Oral   Resp: 15  20 17   Height:      Weight:      SpO2: 91% 93%  92% 91%    Wt Readings from Last 3 Encounters:  07/17/13 76.5 kg (168 lb 10.4 oz)  04/09/13 76.476 kg (168 lb 9.6 oz)  12/21/12 72.1 kg (158 lb 15.2 oz)     Intake/Output Summary (Last 24 hours) at 07/18/13 1117 Last data filed at 07/18/13 0900  Gross per 24 hour  Intake    240 ml  Output      0 ml  Net    240 ml     Physical Exam  Awake Alert, Oriented X 3, No new F.N deficits, Normal affect [place].AT,PERRAL Supple Neck,No JVD, No cervical lymphadenopathy appriciated.  Symmetrical Chest wall movement, Good air movement bilaterally, minimal wheezing RRR,No Gallops,Rubs or new Murmurs, No Parasternal Heave +ve B.Sounds, Abd Soft, No tenderness, No organomegaly appriciated, No rebound - guarding or rigidity. No Cyanosis, Clubbing or edema, No new Rash or bruise      Data Review   Micro Results No results  found for this or any previous visit (from the past 240 hour(s)).  Radiology Reports Dg Chest 2 View  07/17/2013   CLINICAL DATA:  Shortness of breath with history of COPD and previous tobacco use.  EXAM: CHEST  2 VIEW  COMPARISON:  Portable chest x-ray of December 21, 2012. And PA and lateral chest x-ray of March 12, 2012  FINDINGS: The lungs remain hyperinflated. The interstitial markings are mildly increased bilaterally. The cardiac silhouette and mediastinal structures are normal. There is no pleural effusion. The bony thorax is unremarkable.  IMPRESSION: There are chronic changes of COPD and previous tobacco use. There is no pneumonia nor CHF or other acute cardiopulmonary disease. Superimposed acute bronchitis is not excluded.   Electronically Signed   By: David  [place]   On: 07/17/2013 11:56    CBC  Recent Labs Lab 07/17/13 1050 07/17/13 1403 07/18/13 0525  WBC 11.1* 9.2 8.3  HGB 13.1 12.7* 12.3*  HCT 41.6 40.1 38.7*  PLT 260 250 238  MCV 97.4 97.6 97.5  MCH 30.7 30.9 31.0  MCHC 31.5 31.7 31.8  RDW 14.1 14.1 13.8    Chemistries   Recent Labs Lab 07/17/13 1050 07/17/13 1403 07/18/13 0525  NA 142  --  140  K 4.0  --  3.3*  CL 98  --  97  CO2 25  --  26  GLUCOSE 104*  --  164*  BUN 22  --  19  CREATININE 1.09 1.06 0.92  CALCIUM 9.3  --  9.2  AST  --   --  16  ALT  --   --  9  ALKPHOS  --   --  49  BILITOT  --   --  <0.2*   ------------------------------------------------------------------------------------------------------------------ estimated creatinine clearance is 67.7 ml/min (by C-G formula based on Cr of 0.92). ------------------------------------------------------------------------------------------------------------------ No results found for this basename: HGBA1C,  in the last 72 hours ------------------------------------------------------------------------------------------------------------------ No results found for this basename: CHOL, HDL,  LDLCALC, TRIG, CHOLHDL, LDLDIRECT,  in the last 72 hours ------------------------------------------------------------------------------------------------------------------ No results found for this basename: TSH, T4TOTAL, FREET3, T3FREE, THYROIDAB,  in the last 72 hours ------------------------------------------------------------------------------------------------------------------ No results found for this basename: VITAMINB12, FOLATE, FERRITIN, TIBC, IRON, RETICCTPCT,  in the last 72 hours  Coagulation profile No results found for this basename: INR, PROTIME,  in the last 168 hours   Recent Labs  07/17/13 1050  DDIMER 0.39    Cardiac Enzymes  Recent Labs Lab 07/17/13 1500 07/17/13 2101 07/18/13 0525  TROPONINI <0.30 <0.30 <0.30   ------------------------------------------------------------------------------------------------------------------  No components found with this basename: POCBNP,      Time Spent in minutes   35   Susa RaringSINGH,Roman Sandall K M.D on 07/18/2013 at 11:17 AM  Between 7am to 7pm - Pager - [phone]  After 7pm go to [email or web address] - password [email or web address]  And look for the night coverage person covering for me after hours  [facility] Office  [phone]   **Disclaimer: This note may have been dictated with voice recognition software. Similar sounding words can inadvertently be transcribed and this note may contain transcription errors which may not have been corrected upon publication of note.**

## 2013-07-19 DIAGNOSIS — J441 Chronic obstructive pulmonary disease with (acute) exacerbation: Secondary | ICD-10-CM | POA: Diagnosis not present

## 2013-07-19 LAB — POTASSIUM: Potassium: 4.5 mEq/L (ref 3.7–5.3)

## 2013-07-19 MED ORDER — IBUPROFEN 600 MG PO TABS: 600.0000 mg | ORAL_TABLET | Freq: Two times a day (BID) | ORAL | Status: DC | PRN

## 2013-07-19 MED ORDER — HYDROCODONE-ACETAMINOPHEN 5-325 MG PO TABS: 1.0000 | ORAL_TABLET | Freq: Four times a day (QID) | ORAL | Status: DC | PRN

## 2013-07-19 NOTE — Progress Notes (Signed)
NURSING PROGRESS NOTE  [name] Morningddie Sloane Jr. [id number] Discharge Data: 07/19/2013 2:23 PM Attending Provider: No att. providers found [id number]:Millsaps, [name], NP     [name] MorningEddie Viele Jr. to be D/C'd Home per MD order.  Discussed with the patient the After Visit Summary and all questions fully answered. All IV's discontinued with no bleeding noted. All belongings returned to patient for patient to take home.   Last Vital Signs:  Blood pressure 158/84, pulse 85, temperature 99.6 F (37.6 C), temperature source Oral, resp. rate 16, height 5' 6.93" (1.7 m), weight 76.5 kg (168 lb 10.4 oz), SpO2 93.00%.  Discharge Medication List   Medication List         albuterol 108 (90 BASE) MCG/ACT inhaler  Commonly known as:  PROVENTIL HFA;VENTOLIN HFA  Inhale 2 puffs into the lungs every 6 (six) hours as needed for wheezing or shortness of breath.     albuterol 0.63 MG/3ML nebulizer solution  Commonly known as:  ACCUNEB  Take 1 ampule by nebulization every 6 (six) hours as needed for wheezing.     albuterol-ipratropium 18-103 MCG/ACT inhaler  Commonly known as:  COMBIVENT  Inhale 1 puff into the lungs every 4 (four) hours.     ANTACID PO  Take 1 tablet by mouth daily as needed (acid reflux).     Fluticasone-Salmeterol 250-50 MCG/DOSE Aepb  Commonly known as:  ADVAIR  Inhale 1 puff into the lungs 2 (two) times daily.     hydrochlorothiazide 25 MG tablet  Commonly known as:  HYDRODIURIL  Take 25 mg by mouth daily.     HYDROcodone-acetaminophen 5-325 MG per tablet  Commonly known as:  NORCO/VICODIN  Take 1 tablet by mouth every 6 (six) hours as needed for moderate pain.     ibuprofen 600 MG tablet  Commonly known as:  ADVIL,MOTRIN  Take 1 tablet (600 mg total) by mouth 2 (two) times daily as needed.     tiotropium 18 MCG inhalation capsule  Commonly known as:  SPIRIVA  Place 18 mcg into inhaler and inhale daily.     vitamin C 500 MG tablet  Commonly known as:  ASCORBIC ACID  Take 500  mg by mouth daily.     Vitamin D-3 5000 UNITS Tabs  Take 5,000 Units by mouth daily.     VITAMIN E PO  Take 1 tablet by mouth daily.

## 2013-07-19 NOTE — Discharge Summary (Signed)
[name] Coole., is a 73 y.o. male  DOB 1940/06/18  MRN [id number].  Admission date:  07/17/2013  Admitting Physician  Jerald Kief, MD  Discharge Date:  07/19/2013   Primary MD  [name], NP  Recommendations for primary care physician for things to follow:   Outpatient workup for T-spine fracture, monitor T-spine x-rays closely. Make sure patient follows with neurosurgery    Admission Diagnosis  Unspecified essential hypertension [401.9] COPD (chronic obstructive pulmonary disease) [496] Hypoxia [799.02] COPD exacerbation [491.21]   Discharge Diagnosis  Unspecified essential hypertension [401.9] COPD (chronic obstructive pulmonary disease) [496] Hypoxia [799.02] COPD exacerbation [491.21]    Principal Problem:   COPD exacerbation Active Problems:   HYPERTENSION   COPD (chronic obstructive pulmonary disease)      Past Medical History  Diagnosis Date  . COPD (chronic obstructive pulmonary disease)   . Chronic bronchitis     "get it q yr" (07/17/2013)  . Hypertension   . DVT (deep venous thrombosis) 1990's    "?LLE"  . On home oxygen therapy     "2L 24/7 since Monday" (07/17/2013)  . Pneumonia     "several times"  . GERD (gastroesophageal reflux disease)     "related to RX I take" (07/17/2013)  . Daily headache   . Migraine     "all my life; no really bad migraines in 8-9 since work stress is gone" (07/17/2013)    Past Surgical History  Procedure Laterality Date  . Tonsillectomy  1949  . Inguinal hernia repair Left ~ 1990     Discharge Condition: stable   Follow UP  Follow-up Information   Follow up with Millsaps, Joelene Millin, NP. Schedule an appointment as soon as possible for a visit in 1 week.   Contact information:   [facility] [address] [place]  [address] [phone]       Follow up with Lisbeth Renshaw, C, MD. Schedule an appointment as soon as possible for a visit in 1 week.   Specialty:  Neurosurgery   Contact information:   [address] [place] [address] [phone]         Discharge Instructions  and  Discharge Medications      Discharge Instructions   Diet - low sodium heart healthy    Complete by:  As directed      Discharge instructions    Complete by:  As directed   Follow with Primary MD [name], NP in 7 days , get a repeat thoracic spine x-ray in one to 2 weeks. Outpatient workup for spine fracture.  Where the spine brace when out of the bed. Follow with recommended neurosurgeon in one to 2 weeks  Get CBC, CMP, 2 view Chest X ray checked  by Primary MD next visit.    Activity: As tolerated with Full fall precautions use walker/cane & assistance as needed   Disposition Home    Diet: Heart Healthy    For Heart  failure patients - Check your Weight same time everyday, if you gain over 2 pounds, or you develop in leg swelling, experience more shortness of breath or chest pain, call your Primary MD immediately. Follow Cardiac Low Salt Diet and 1.8 lit/day fluid restriction.   On your next visit with her primary care physician please Get Medicines reviewed and adjusted.  Please request your Prim.MD to go over all Hospital Tests and Procedure/Radiological results at the follow up, please get all Hospital records sent to your Prim MD by signing hospital release before you go home.   If you experience worsening of your admission symptoms, develop shortness of breath, life threatening emergency, suicidal or homicidal thoughts you must seek medical attention immediately by calling 911 or calling your MD immediately  if symptoms less severe.  You Must read complete instructions/literature along with all the possible adverse reactions/side effects for all the Medicines you take  and that have been prescribed to you. Take any new Medicines after you have completely understood and accpet all the possible adverse reactions/side effects.   Do not drive and provide baby sitting services if your were admitted for syncope or siezures until you have seen by Primary MD or a Neurologist and advised to do so again.  Do not drive when taking Pain medications.    Do not take more than prescribed Pain, Sleep and Anxiety Medications  Special Instructions: If you have smoked or chewed Tobacco  in the last 2 yrs please stop smoking, stop any regular Alcohol  and or any Recreational drug use.  Wear Seat belts while driving.   Please note  You were cared for by a hospitalist during your hospital stay. If you have any questions about your discharge medications or the care you received while you were in the hospital after you are discharged, you can call the unit and asked to speak with the hospitalist on call if the hospitalist that took care of you is not available. Once you are discharged, your primary care physician will handle any further medical issues. Please note that NO REFILLS for any discharge medications will be authorized once you are discharged, as it is imperative that you return to your primary care physician (or establish a relationship with a primary care physician if you do not have one) for your aftercare needs so that they can reassess your need for medications and monitor your lab values.  Follow with Primary MD Millsaps, [name], NP in 7 days , get a repeat thoracic spine x-ray in one to 2 weeks. Outpatient workup for spine fracture.  Where the spine brace when out of the bed. Follow with recommended neurosurgeon in one to 2 weeks  Get CBC, CMP, 2 view Chest X ray checked  by Primary MD next visit.    Activity: As tolerated with Full fall precautions use walker/cane & assistance as needed   Disposition Home    Diet: Heart Healthy    For Heart failure  patients - Check your Weight same time everyday, if you gain over 2 pounds, or you develop in leg swelling, experience more shortness of breath or chest pain, call your Primary MD immediately. Follow Cardiac Low Salt Diet and 1.8 lit/day fluid restriction.   On your next visit with her primary care physician please Get Medicines reviewed and adjusted.  Please request your Prim.MD to go over all Hospital Tests and Procedure/Radiological results at the follow up, please get all Hospital records sent to your Prim MD by  signing hospital release before you go home.   If you experience worsening of your admission symptoms, develop shortness of breath, life threatening emergency, suicidal or homicidal thoughts you must seek medical attention immediately by calling 911 or calling your MD immediately  if symptoms less severe.  You Must read complete instructions/literature along with all the possible adverse reactions/side effects for all the Medicines you take and that have been prescribed to you. Take any new Medicines after you have completely understood and accpet all the possible adverse reactions/side effects.   Do not drive and provide baby sitting services if your were admitted for syncope or siezures until you have seen by Primary MD or a Neurologist and advised to do so again.  Do not drive when taking Pain medications.    Do not take more than prescribed Pain, Sleep and Anxiety Medications  Special Instructions: If you have smoked or chewed Tobacco  in the last 2 yrs please stop smoking, stop any regular Alcohol  and or any Recreational drug use.  Wear Seat belts while driving.   Please note  You were cared for by a hospitalist during your hospital stay. If you have any questions about your discharge medications or the care you received while you were in the hospital after you are discharged, you can call the unit and asked to speak with the hospitalist on call if the hospitalist that  took care of you is not available. Once you are discharged, your primary care physician will handle any further medical issues. Please note that NO REFILLS for any discharge medications will be authorized once you are discharged, as it is imperative that you return to your primary care physician (or establish a relationship with a primary care physician if you do not have one) for your aftercare needs so that they can reassess your need for medications and monitor your lab values.     Increase activity slowly    Complete by:  As directed             Medication List         albuterol 108 (90 BASE) MCG/ACT inhaler  Commonly known as:  PROVENTIL HFA;VENTOLIN HFA  Inhale 2 puffs into the lungs every 6 (six) hours as needed for wheezing or shortness of breath.     albuterol 0.63 MG/3ML nebulizer solution  Commonly known as:  ACCUNEB  Take 1 ampule by nebulization every 6 (six) hours as needed for wheezing.     albuterol-ipratropium 18-103 MCG/ACT inhaler  Commonly known as:  COMBIVENT  Inhale 1 puff into the lungs every 4 (four) hours.     ANTACID PO  Take 1 tablet by mouth daily as needed (acid reflux).     Fluticasone-Salmeterol 250-50 MCG/DOSE Aepb  Commonly known as:  ADVAIR  Inhale 1 puff into the lungs 2 (two) times daily.     hydrochlorothiazide 25 MG tablet  Commonly known as:  HYDRODIURIL  Take 25 mg by mouth daily.     HYDROcodone-acetaminophen 5-325 MG per tablet  Commonly known as:  NORCO/VICODIN  Take 1 tablet by mouth every 6 (six) hours as needed for moderate pain.     ibuprofen 600 MG tablet  Commonly known as:  ADVIL,MOTRIN  Take 1 tablet (600 mg total) by mouth 2 (two) times daily as needed.     tiotropium 18 MCG inhalation capsule  Commonly known as:  SPIRIVA  Place 18 mcg into inhaler and inhale daily.     vitamin C  500 MG tablet  Commonly known as:  ASCORBIC ACID  Take 500 mg by mouth daily.     Vitamin D-3 5000 UNITS Tabs  Take 5,000 Units by mouth  daily.     VITAMIN E PO  Take 1 tablet by mouth daily.          Diet and Activity recommendation: See Discharge Instructions above   Consults obtained -    Major procedures and Radiology Reports - PLEASE review detailed and final reports for all details, in brief -       Dg Chest 2 View  07/17/2013   CLINICAL DATA:  Shortness of breath with history of COPD and previous tobacco use.  EXAM: CHEST  2 VIEW  COMPARISON:  Portable chest x-ray of December 21, 2012. And PA and lateral chest x-ray of March 12, 2012  FINDINGS: The lungs remain hyperinflated. The interstitial markings are mildly increased bilaterally. The cardiac silhouette and mediastinal structures are normal. There is no pleural effusion. The bony thorax is unremarkable.  IMPRESSION: There are chronic changes of COPD and previous tobacco use. There is no pneumonia nor CHF or other acute cardiopulmonary disease. Superimposed acute bronchitis is not excluded.   Electronically Signed   By: David  [place]   On: 07/17/2013 11:56   Ct Angio Chest Pe W/cm &/or Wo Cm  07/18/2013   CLINICAL DATA:  Shortness of breath with upper back pain.  EXAM: CT ANGIOGRAPHY CHEST WITH CONTRAST  TECHNIQUE: Multidetector CT imaging of the chest was performed using the standard protocol during bolus administration of intravenous contrast. Multiplanar CT image reconstructions and MIPs were obtained to evaluate the vascular anatomy.  CONTRAST:  OMNIPAQUE IOHEXOL 350 MG/ML SOLN  COMPARISON:  10/30/2008  FINDINGS: Lungs are adequately inflated with mild bilateral centrilobular emphysematous disease. There is mild scarring over the right mid upper lung. There is no focal consolidation or effusion. The heart is normal in size. Pulmonary arterial system is within normal without evidence of emboli. Minimal calcified plaque over the left main, anterior descending and lateral circumflex coronary arteries. Remaining mediastinal structures are unremarkable.   Images through the upper abdomen are unremarkable. There is mild degenerative change of the spine with subtle loss of height of a mid thoracic vertebral body.  Review of the MIP images confirms the above findings.  IMPRESSION: No acute cardiopulmonary disease. No evidence of pulmonary embolism.  Diffuse emphysematous disease with scarring over the right mid to upper lung.  Subtle loss of height of a mid thoracic vertebral body which may represent an evolving compression fracture.  Atherosclerotic coronary artery disease.   Electronically Signed   By: Elberta Fortis M.D.   On: 07/18/2013 11:20    Micro Results      No results found for this or any previous visit (from the past 240 hour(s)).   History of present illness and  Hospital Course:     Kindly see H&P for history of present illness and admission details, please review complete Labs, Consult reports and Test reports for all details in brief [name] Ralph., is a 73 y.o. male, patient with history of  COPD on home oxygen, hypertension who was admitted to the hospital with symptoms of T-spine pain located in between his shoulder blades preventing him from taking deep breaths, initially it was thought that patient had COPD exacerbation causing shortness of breath but after detailed history it was found that his main problem was back pain which was preventing him from taking deep  breaths.   He underwent CT angiogram of the chest which revealed no PE or acute cardiac or pulmonary process however it did show an evolving T-spine fracture which I think is causing his problems, he does have history of straining himself while gardening few days ago at which point this pain started. He also has had extensive exposure to steroids in the past due to his underlying COPD. On lung exam is stable at baseline I do not think he has COPD exacerbation and will not be placed on any extra steroids or antibiotics at this time. His breathing is stable and he will  continue his home oxygen nebulizer treatments unchanged. He will be given Motrin for the next few days, cortex and a TLSO brace. We'll request PCP to do outpatient workup for T-spine fracture, we'll request patient to follow one time with neurosurgery in the outpatient setting.   Pain is much better, nonradiating, with no focal deficits or weakness whatsoever. He is eager to go home.    Today   Subjective:   [name] [name] today has no headache,no chest abdominal pain,no new weakness tingling or numbness, feels much better wants to go home today.     Objective:   Blood pressure 110/68, pulse 76, temperature 98 F (36.7 C), temperature source Oral, resp. rate 18, height 5' 6.93" (1.7 m), weight 76.5 kg (168 lb 10.4 oz), SpO2 96.00%.   Intake/Output Summary (Last 24 hours) at 07/19/13 0915 Last data filed at 07/18/13 1300  Gross per 24 hour  Intake    222 ml  Output      0 ml  Net    222 ml    Exam  Awake Alert, Oriented x 3, No new F.N deficits, Normal affect Three Way.AT,PERRAL Supple Neck,No JVD, No cervical lymphadenopathy appriciated.  Symmetrical Chest wall movement, Good air movement bilaterally, CTAB RRR,No Gallops,Rubs or new Murmurs, No Parasternal Heave +ve B.Sounds, Abd Soft, Non tender, No organomegaly appriciated, No rebound -guarding or rigidity. No Cyanosis, Clubbing or edema, No new Rash or bruise    Data Review   CBC w Diff: Lab Results  Component Value Date   WBC 8.3 07/18/2013   HGB 12.3* 07/18/2013   HCT 38.7* 07/18/2013   PLT 238 07/18/2013   LYMPHOPCT 4* 12/21/2012   MONOPCT 1* 12/21/2012   EOSPCT 0 12/21/2012   BASOPCT 0 12/21/2012    CMP: Lab Results  Component Value Date   NA 140 07/18/2013   K 4.5 07/19/2013   CL 97 07/18/2013   CO2 26 07/18/2013   BUN 19 07/18/2013   CREATININE 0.92 07/18/2013   PROT 6.5 07/18/2013   ALBUMIN 3.6 07/18/2013   BILITOT <0.2* 07/18/2013   ALKPHOS 49 07/18/2013   AST 16 07/18/2013   ALT 9 07/18/2013  .   Total Time  in preparing paper work, data evaluation and todays exam - 35 minutes  Leroy Sea M.D on 07/19/2013 at 9:15 AM  [facility] Office  [phone]   **Disclaimer: This note may have been dictated with voice recognition software. Similar sounding words can inadvertently be transcribed and this note may contain transcription errors which may not have been corrected upon publication of note.**

## 2013-07-19 NOTE — Discharge Instructions (Signed)
Follow with Primary MD Millsaps, [name], NP in 7 days , get a repeat thoracic spine x-ray in one to 2 weeks. Outpatient workup for spine fracture.  Where the spine brace when out of the bed. Follow with recommended neurosurgeon in one to 2 weeks  Get CBC, CMP, 2 view Chest X ray checked  by Primary MD next visit.    Activity: As tolerated with Full fall precautions use walker/cane & assistance as needed   Disposition Home    Diet: Heart Healthy    For Heart failure patients - Check your Weight same time everyday, if you gain over 2 pounds, or you develop in leg swelling, experience more shortness of breath or chest pain, call your Primary MD immediately. Follow Cardiac Low Salt Diet and 1.8 lit/day fluid restriction.   On your next visit with her primary care physician please Get Medicines reviewed and adjusted.  Please request your Prim.MD to go over all Hospital Tests and Procedure/Radiological results at the follow up, please get all Hospital records sent to your Prim MD by signing hospital release before you go home.   If you experience worsening of your admission symptoms, develop shortness of breath, life threatening emergency, suicidal or homicidal thoughts you must seek medical attention immediately by calling 911 or calling your MD immediately  if symptoms less severe.  You Must read complete instructions/literature along with all the possible adverse reactions/side effects for all the Medicines you take and that have been prescribed to you. Take any new Medicines after you have completely understood and accpet all the possible adverse reactions/side effects.   Do not drive and provide baby sitting services if your were admitted for syncope or siezures until you have seen by Primary MD or a Neurologist and advised to do so again.  Do not drive when taking Pain medications.    Do not take more than prescribed Pain, Sleep and Anxiety Medications  Special Instructions: If  you have smoked or chewed Tobacco  in the last 2 yrs please stop smoking, stop any regular Alcohol  and or any Recreational drug use.  Wear Seat belts while driving.   Please note  You were cared for by a hospitalist during your hospital stay. If you have any questions about your discharge medications or the care you received while you were in the hospital after you are discharged, you can call the unit and asked to speak with the hospitalist on call if the hospitalist that took care of you is not available. Once you are discharged, your primary care physician will handle any further medical issues. Please note that NO REFILLS for any discharge medications will be authorized once you are discharged, as it is imperative that you return to your primary care physician (or establish a relationship with a primary care physician if you do not have one) for your aftercare needs so that they can reassess your need for medications and monitor your lab values.

## 2013-07-23 DIAGNOSIS — Z136 Encounter for screening for cardiovascular disorders: Secondary | ICD-10-CM | POA: Diagnosis not present

## 2013-07-23 DIAGNOSIS — S22009A Unspecified fracture of unspecified thoracic vertebra, initial encounter for closed fracture: Secondary | ICD-10-CM | POA: Diagnosis not present

## 2013-07-23 DIAGNOSIS — J449 Chronic obstructive pulmonary disease, unspecified: Secondary | ICD-10-CM | POA: Diagnosis not present

## 2013-07-28 DIAGNOSIS — M810 Age-related osteoporosis without current pathological fracture: Secondary | ICD-10-CM | POA: Diagnosis not present

## 2013-07-28 DIAGNOSIS — S22009A Unspecified fracture of unspecified thoracic vertebra, initial encounter for closed fracture: Secondary | ICD-10-CM | POA: Diagnosis not present

## 2013-07-28 DIAGNOSIS — Z13820 Encounter for screening for osteoporosis: Secondary | ICD-10-CM | POA: Diagnosis not present

## 2013-08-01 DIAGNOSIS — J40 Bronchitis, not specified as acute or chronic: Secondary | ICD-10-CM | POA: Diagnosis not present

## 2013-08-05 DIAGNOSIS — T148XXA Other injury of unspecified body region, initial encounter: Secondary | ICD-10-CM | POA: Diagnosis not present

## 2013-08-06 ENCOUNTER — Emergency Department (HOSPITAL_COMMUNITY)
Admission: EM | Admit: 2013-08-06 | Discharge: 2013-08-06 | Disposition: A | Discharge status: Home / Self Care | Payer: Medicare Other | Attending: Emergency Medicine | Admitting: Emergency Medicine

## 2013-08-06 ENCOUNTER — Emergency Department (HOSPITAL_COMMUNITY): Payer: Medicare Other

## 2013-08-06 ENCOUNTER — Encounter (HOSPITAL_COMMUNITY): Payer: Self-pay | Admitting: Emergency Medicine

## 2013-08-06 DIAGNOSIS — Z8701 Personal history of pneumonia (recurrent): Secondary | ICD-10-CM | POA: Diagnosis not present

## 2013-08-06 DIAGNOSIS — Z8719 Personal history of other diseases of the digestive system: Secondary | ICD-10-CM | POA: Insufficient documentation

## 2013-08-06 DIAGNOSIS — Z86718 Personal history of other venous thrombosis and embolism: Secondary | ICD-10-CM | POA: Insufficient documentation

## 2013-08-06 DIAGNOSIS — J438 Other emphysema: Secondary | ICD-10-CM | POA: Diagnosis not present

## 2013-08-06 DIAGNOSIS — Z87891 Personal history of nicotine dependence: Secondary | ICD-10-CM | POA: Diagnosis not present

## 2013-08-06 DIAGNOSIS — Z791 Long term (current) use of non-steroidal anti-inflammatories (NSAID): Secondary | ICD-10-CM | POA: Diagnosis not present

## 2013-08-06 DIAGNOSIS — I10 Essential (primary) hypertension: Secondary | ICD-10-CM | POA: Diagnosis not present

## 2013-08-06 DIAGNOSIS — J441 Chronic obstructive pulmonary disease with (acute) exacerbation: Secondary | ICD-10-CM | POA: Insufficient documentation

## 2013-08-06 DIAGNOSIS — R0602 Shortness of breath: Secondary | ICD-10-CM | POA: Diagnosis not present

## 2013-08-06 DIAGNOSIS — Z79899 Other long term (current) drug therapy: Secondary | ICD-10-CM | POA: Diagnosis not present

## 2013-08-06 DIAGNOSIS — IMO0002 Reserved for concepts with insufficient information to code with codable children: Secondary | ICD-10-CM | POA: Insufficient documentation

## 2013-08-06 LAB — CBC WITH DIFFERENTIAL/PLATELET
BASOS PCT: 1 % (ref 0–1)
Basophils Absolute: 0 10*3/uL (ref 0.0–0.1)
Eosinophils Absolute: 0.1 10*3/uL (ref 0.0–0.7)
Eosinophils Relative: 1 % (ref 0–5)
HEMATOCRIT: 42.8 % (ref 39.0–52.0)
HEMOGLOBIN: 13.8 g/dL (ref 13.0–17.0)
LYMPHS ABS: 1.4 10*3/uL (ref 0.7–4.0)
Lymphocytes Relative: 18 % (ref 12–46)
MCH: 31.4 pg (ref 26.0–34.0)
MCHC: 32.2 g/dL (ref 30.0–36.0)
MCV: 97.5 fL (ref 78.0–100.0)
MONO ABS: 0.5 10*3/uL (ref 0.1–1.0)
MONOS PCT: 6 % (ref 3–12)
NEUTROS ABS: 5.7 10*3/uL (ref 1.7–7.7)
Neutrophils Relative %: 74 % (ref 43–77)
Platelets: 247 10*3/uL (ref 150–400)
RBC: 4.39 MIL/uL (ref 4.22–5.81)
RDW: 13.7 % (ref 11.5–15.5)
WBC: 7.6 10*3/uL (ref 4.0–10.5)

## 2013-08-06 LAB — PRO B NATRIURETIC PEPTIDE: PRO B NATRI PEPTIDE: 103.4 pg/mL (ref 0–125)

## 2013-08-06 LAB — COMPREHENSIVE METABOLIC PANEL
ALBUMIN: 4.1 g/dL (ref 3.5–5.2)
ALK PHOS: 72 U/L (ref 39–117)
ALT: 13 U/L (ref 0–53)
AST: 18 U/L (ref 0–37)
Anion gap: 17 — ABNORMAL HIGH (ref 5–15)
BUN: 22 mg/dL (ref 6–23)
CHLORIDE: 100 meq/L (ref 96–112)
CO2: 26 mEq/L (ref 19–32)
CREATININE: 1.06 mg/dL (ref 0.50–1.35)
Calcium: 9.7 mg/dL (ref 8.4–10.5)
GFR calc non Af Amer: 68 mL/min — ABNORMAL LOW (ref >90)
GFR, EST AFRICAN AMERICAN: 79 mL/min — AB (ref >90)
Glucose, Bld: 102 mg/dL — ABNORMAL HIGH (ref 70–99)
Potassium: 3.7 mEq/L (ref 3.7–5.3)
Sodium: 143 mEq/L (ref 137–147)
Total Protein: 6.9 g/dL (ref 6.0–8.3)

## 2013-08-06 MED ORDER — ALBUTEROL SULFATE (2.5 MG/3ML) 0.083% IN NEBU
5.0000 mg | INHALATION_SOLUTION | Freq: Once | RESPIRATORY_TRACT | Status: AC
Start: 2013-08-06 — End: 2013-08-06
  Administered 2013-08-06: 5 mg via RESPIRATORY_TRACT
  Filled 2013-08-06: qty 6

## 2013-08-06 MED ORDER — ALBUTEROL SULFATE (2.5 MG/3ML) 0.083% IN NEBU
INHALATION_SOLUTION | RESPIRATORY_TRACT | Status: AC
  Administered 2013-08-06: 5 mg via RESPIRATORY_TRACT
  Filled 2013-08-06: qty 3

## 2013-08-06 MED ORDER — PREDNISONE 20 MG PO TABS
60.0000 mg | ORAL_TABLET | Freq: Every day | ORAL | Status: DC
  Administered 2013-08-06: 60 mg via ORAL
  Filled 2013-08-06: qty 3

## 2013-08-06 MED ORDER — PREDNISONE 20 MG PO TABS: 40.0000 mg | ORAL_TABLET | Freq: Every day | ORAL | Status: AC

## 2013-08-06 NOTE — Discharge Instructions (Signed)
As discussed, your evaluation today has been largely reassuring.  But, it is important that you monitor your condition carefully, and do not hesitate to return to the ED if you develop new, or concerning changes in your condition.  For the next 2 days, please be sure to use your breathing treatments every 4 hours.  In addition, please be sure to use your oxygen at night.  Otherwise, please follow-up with your physician for appropriate ongoing care.

## 2013-08-06 NOTE — ED Notes (Signed)
Pt c/o increased SOB; pt with hx of COPD sts did breathing treatments this am x 3; pt sts recent compression fracture in back; pt labored at present on 2 L home O2

## 2013-08-06 NOTE — ED Provider Notes (Signed)
CSN: [id number]     Arrival date & time 08/06/13  1108 History   First MD Initiated Contact with Patient 08/06/13 1114     Chief Complaint  Patient presents with  . Shortness of Breath     (Consider location/radiation/quality/duration/timing/severity/associated sxs/prior Treatment) HPI Patient presents with dyspnea, fatigue. Patient has history of COPD, oxygen dependency. He notes that today he woke up with her dyspnea. Patient has had episodes of dyspnea over the past month, both during recent hospitalization for pneumonia and subsequently. He denies new fevers, chills, confusion, disorientation, chest pain. No clear exacerbating, leaving, precipitating factors. Past Medical History  Diagnosis Date  . COPD (chronic obstructive pulmonary disease)   . Chronic bronchitis     "get it q yr" (07/17/2013)  . Hypertension   . DVT (deep venous thrombosis) 1990's    "?LLE"  . On home oxygen therapy     "2L 24/7 since Monday" (07/17/2013)  . Pneumonia     "several times"  . GERD (gastroesophageal reflux disease)     "related to RX I take" (07/17/2013)  . Daily headache   . Migraine     "all my life; no really bad migraines in 8-9 since work stress is gone" (07/17/2013)   Past Surgical History  Procedure Laterality Date  . Tonsillectomy  1949  . Inguinal hernia repair Left ~ 1990   Family History  Problem Relation Age of Onset  . Breast cancer Mother    History  Substance Use Topics  . Smoking status: Former Smoker -- 1.00 packs/day for 10 years    Types: Cigarettes    Quit date: 02/06/1998  . Smokeless tobacco: Never Used  . Alcohol Use: No    Review of Systems  Constitutional:       Per HPI, otherwise negative  HENT:       Per HPI, otherwise negative  Respiratory:       Per HPI, otherwise negative  Cardiovascular:       Per HPI, otherwise negative  Gastrointestinal: Negative for vomiting.  Endocrine:       Negative aside from HPI  Genitourinary:       Neg aside  from HPI   Musculoskeletal:       Recent compression fracture of mid thoracic spine  Skin: Negative.   Neurological: Negative for syncope.      Allergies  Codeine  Home Medications   Prior to Admission medications   Medication Sig Start Date End Date Taking? Authorizing Provider  albuterol (ACCUNEB) 0.63 MG/3ML nebulizer solution Take 1 ampule by nebulization every 6 (six) hours as needed for wheezing.    Historical Provider, MD  albuterol (PROVENTIL HFA;VENTOLIN HFA) 108 (90 BASE) MCG/ACT inhaler Inhale 2 puffs into the lungs every 6 (six) hours as needed for wheezing or shortness of breath.     Historical Provider, MD  albuterol-ipratropium (COMBIVENT) 18-103 MCG/ACT inhaler Inhale 1 puff into the lungs every 4 (four) hours.    Historical Provider, MD  Calcium Carbonate Antacid (ANTACID PO) Take 1 tablet by mouth daily as needed (acid reflux).    Historical Provider, MD  Cholecalciferol (VITAMIN D-3) 5000 UNITS TABS Take 5,000 Units by mouth daily.     Historical Provider, MD  Fluticasone-Salmeterol (ADVAIR) 250-50 MCG/DOSE AEPB Inhale 1 puff into the lungs 2 (two) times daily.    Historical Provider, MD  hydrochlorothiazide (HYDRODIURIL) 25 MG tablet Take 25 mg by mouth daily.    Historical Provider, MD  HYDROcodone-acetaminophen (NORCO/VICODIN) 5-325 MG per tablet  Take 1 tablet by mouth every 6 (six) hours as needed for moderate pain. 07/19/13   Leroy SeaPrashant K Singh, MD  ibuprofen (ADVIL,MOTRIN) 600 MG tablet Take 1 tablet (600 mg total) by mouth 2 (two) times daily as needed. 07/19/13   Leroy SeaPrashant K Singh, MD  tiotropium (SPIRIVA) 18 MCG inhalation capsule Place 18 mcg into inhaler and inhale daily.    Historical Provider, MD  vitamin C (ASCORBIC ACID) 500 MG tablet Take 500 mg by mouth daily.    Historical Provider, MD  VITAMIN E PO Take 1 tablet by mouth daily.    Historical Provider, MD   BP 125/76  Pulse 91  Temp(Src) 98.1 F (36.7 C) (Oral)  Resp 25  SpO2 97% Physical Exam   Nursing note and vitals reviewed. Constitutional: He is oriented to person, place, and time. He appears well-developed. No distress.  HENT:  Head: Normocephalic and atraumatic.  Eyes: Conjunctivae and EOM are normal.  Cardiovascular: Normal rate and regular rhythm.   Pulmonary/Chest: No stridor. Tachypnea noted. No respiratory distress. He has decreased breath sounds.  Abdominal: He exhibits no distension.  Musculoskeletal: He exhibits no edema.  Neurological: He is alert and oriented to person, place, and time.  Skin: Skin is warm and dry.  Psychiatric: He has a normal mood and affect.    ED Course  Procedures (including critical care time) Labs Review Labs Reviewed  COMPREHENSIVE METABOLIC PANEL - Abnormal; Notable for the following:    Glucose, Bld 102 (*)    Total Bilirubin <0.2 (*)    GFR calc non Af Amer 68 (*)    GFR calc Af Amer 79 (*)    Anion gap 17 (*)    All other components within normal limits  CBC WITH DIFFERENTIAL  PRO B NATRIURETIC PEPTIDE    Imaging Review Dg Chest 2 View  08/06/2013   CLINICAL DATA:  Shortness of breath.  EXAM: CHEST  2 VIEW  COMPARISON:  CT chest 07/18/2013.  PA and lateral chest 07/17/2013.  FINDINGS: Changes of severe emphysema are again seen. The lungs are clear. Heart size is normal. There is no pneumothorax or pleural effusion.  IMPRESSION: Severe emphysema without acute disease.   Electronically Signed   By: Drusilla Kannerhomas  Dalessio M.D.   On: 08/06/2013 13:03     EKG Interpretation   Date/Time:  Wednesday August 06 2013 11:12:07 EDT Ventricular Rate:  105 PR Interval:  146 QRS Duration: 88 QT Interval:  318 QTC Calculation: 420 R Axis:   87 Text Interpretation:  Sinus tachycardia Possible Left atrial enlargement  Low voltage QRS Cannot rule out Inferior infarct , age undetermined Cannot  rule out Anterior infarct , age undetermined Abnormal ECG Sinus  tachycardia Low voltage QRS Non-specific intra-ventricular conduction  delay No  significant change since last tracing T wave abnormality Abnormal  ekg Confirmed by Gerhard MunchLOCKWOOD, Schneider Warchol  MD [phone]([id number]) on 08/06/2013 12:06:26 PM     2:20 PM Patient states that he feels better. Tachypnea has resolved. No evidence of a respiratory distress. Patient clarifies that he has not used his prescribed oxygen overnight.  MDM   Final diagnoses:  COPD exacerbation    Patient presents with dyspnea.  Patient is afebrile, awake and alert, with no chest pain.  Patient's evaluation is largely reassuring, and he improves clinically with albuterol therapy, steroids. The patient's presentation is most consistent with COPD exacerbation, and given his improvement in his discharged to follow up with primary care and pulmonology.    Gerhard Munchobert Chancelor Hardrick, MD  08/06/13 1421 

## 2013-08-06 NOTE — ED Notes (Signed)
[phone] [facility]

## 2013-08-06 NOTE — ED Notes (Signed)
Pt here for evaluation of shortness of breathe. States he has COPD and wears oxygen PRN at 2 L at home, not daily. Pt states he felt fine yesterday but woke up today short of breathe. Gave himself two or three breathing treatments at home. Pt in no acute distress, no wheezing noted.

## 2013-08-07 ENCOUNTER — Telehealth: Payer: Self-pay | Admitting: Critical Care Medicine

## 2013-08-07 NOTE — Telephone Encounter (Signed)
Received following msg from Dr. Delford FieldWright:  Message     Pt seen in Ed        Needs to see provider next week    ----- Message -----    From: SYSTEM    Sent: 08/06/2013 2:39 PM    To: Storm FriskPatrick E Wright, MD    --------  Called, spoke with pt.  We have scheduled him to see Dr. Vassie LollAlva on Monday, July 6 at 12 pm as PW is off next week.  Pt aware of appt date and time and voiced no further questions or concerns at this time.

## 2013-08-11 ENCOUNTER — Encounter: Payer: Self-pay | Admitting: Pulmonary Disease

## 2013-08-11 ENCOUNTER — Ambulatory Visit (INDEPENDENT_AMBULATORY_CARE_PROVIDER_SITE_OTHER): Payer: Medicare Other | Admitting: Pulmonary Disease

## 2013-08-11 VITALS — BP 140/80 | HR 111 | Wt 169.6 lb

## 2013-08-11 DIAGNOSIS — J441 Chronic obstructive pulmonary disease with (acute) exacerbation: Secondary | ICD-10-CM | POA: Diagnosis not present

## 2013-08-11 MED ORDER — PREDNISONE 10 MG PO TABS: ORAL_TABLET | ORAL | Status: DC
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
## 2013-08-11 NOTE — Assessment & Plan Note (Signed)
We'll treat as COPD flare Given mild hypoxia, will complete prednisone taper Do not feel need for antibiotic, since there is no increase in sputum or change in color Prednisone 10 mg tabs  Take 2 tabs daily with food x3ds, then 1 tab daily with food x 3ds then STOP Stay on nebs thrice daily x 1 week Call if worse

## 2013-08-11 NOTE — Patient Instructions (Signed)
Prednisone 10 mg tabs  Take 2 tabs daily with food x3ds, then 1 tab daily with food x 3ds then STOP Stay on nebs thrice daily x 1 week Call if worse

## 2013-08-21 DIAGNOSIS — I10 Essential (primary) hypertension: Secondary | ICD-10-CM | POA: Diagnosis not present

## 2013-08-21 DIAGNOSIS — Z6825 Body mass index (BMI) 25.0-25.9, adult: Secondary | ICD-10-CM | POA: Diagnosis not present

## 2013-08-21 DIAGNOSIS — T148XXA Other injury of unspecified body region, initial encounter: Secondary | ICD-10-CM | POA: Diagnosis not present

## 2013-10-11 DIAGNOSIS — R141 Gas pain: Secondary | ICD-10-CM | POA: Diagnosis not present

## 2013-10-11 DIAGNOSIS — R0602 Shortness of breath: Secondary | ICD-10-CM | POA: Diagnosis not present

## 2013-10-11 DIAGNOSIS — J209 Acute bronchitis, unspecified: Secondary | ICD-10-CM | POA: Diagnosis not present

## 2013-10-29 ENCOUNTER — Encounter (HOSPITAL_COMMUNITY): Payer: Self-pay | Admitting: Emergency Medicine

## 2013-10-29 ENCOUNTER — Emergency Department (HOSPITAL_COMMUNITY): Payer: Medicare Other

## 2013-10-29 ENCOUNTER — Inpatient Hospital Stay (HOSPITAL_COMMUNITY)
Admission: EM | Admit: 2013-10-29 | Discharge: 2013-11-01 | DRG: 193 | Disposition: A | Discharge status: Home / Self Care | Payer: Medicare Other | Attending: Internal Medicine | Admitting: Internal Medicine

## 2013-10-29 DIAGNOSIS — J189 Pneumonia, unspecified organism: Secondary | ICD-10-CM | POA: Diagnosis not present

## 2013-10-29 DIAGNOSIS — Z86718 Personal history of other venous thrombosis and embolism: Secondary | ICD-10-CM | POA: Diagnosis not present

## 2013-10-29 DIAGNOSIS — J9621 Acute and chronic respiratory failure with hypoxia: Secondary | ICD-10-CM

## 2013-10-29 DIAGNOSIS — R918 Other nonspecific abnormal finding of lung field: Secondary | ICD-10-CM | POA: Diagnosis not present

## 2013-10-29 DIAGNOSIS — R05 Cough: Secondary | ICD-10-CM | POA: Diagnosis not present

## 2013-10-29 DIAGNOSIS — J9620 Acute and chronic respiratory failure, unspecified whether with hypoxia or hypercapnia: Secondary | ICD-10-CM | POA: Diagnosis present

## 2013-10-29 DIAGNOSIS — Z9981 Dependence on supplemental oxygen: Secondary | ICD-10-CM | POA: Diagnosis not present

## 2013-10-29 DIAGNOSIS — I10 Essential (primary) hypertension: Secondary | ICD-10-CM | POA: Diagnosis present

## 2013-10-29 DIAGNOSIS — J449 Chronic obstructive pulmonary disease, unspecified: Secondary | ICD-10-CM

## 2013-10-29 DIAGNOSIS — E876 Hypokalemia: Secondary | ICD-10-CM | POA: Diagnosis present

## 2013-10-29 DIAGNOSIS — R000 Tachycardia, unspecified: Secondary | ICD-10-CM | POA: Diagnosis present

## 2013-10-29 DIAGNOSIS — Z79899 Other long term (current) drug therapy: Secondary | ICD-10-CM

## 2013-10-29 DIAGNOSIS — Z23 Encounter for immunization: Secondary | ICD-10-CM | POA: Diagnosis not present

## 2013-10-29 DIAGNOSIS — K219 Gastro-esophageal reflux disease without esophagitis: Secondary | ICD-10-CM | POA: Diagnosis present

## 2013-10-29 DIAGNOSIS — Z87891 Personal history of nicotine dependence: Secondary | ICD-10-CM

## 2013-10-29 DIAGNOSIS — J9600 Acute respiratory failure, unspecified whether with hypoxia or hypercapnia: Secondary | ICD-10-CM | POA: Diagnosis present

## 2013-10-29 DIAGNOSIS — J441 Chronic obstructive pulmonary disease with (acute) exacerbation: Secondary | ICD-10-CM | POA: Diagnosis present

## 2013-10-29 DIAGNOSIS — D72829 Elevated white blood cell count, unspecified: Secondary | ICD-10-CM | POA: Diagnosis present

## 2013-10-29 DIAGNOSIS — R0602 Shortness of breath: Secondary | ICD-10-CM | POA: Diagnosis not present

## 2013-10-29 DIAGNOSIS — R059 Cough, unspecified: Secondary | ICD-10-CM | POA: Diagnosis not present

## 2013-10-29 DIAGNOSIS — J9601 Acute respiratory failure with hypoxia: Secondary | ICD-10-CM

## 2013-10-29 LAB — BASIC METABOLIC PANEL
ANION GAP: 14 (ref 5–15)
BUN: 16 mg/dL (ref 6–23)
CALCIUM: 9.5 mg/dL (ref 8.4–10.5)
CO2: 29 mEq/L (ref 19–32)
Chloride: 96 mEq/L (ref 96–112)
Creatinine, Ser: 0.81 mg/dL (ref 0.50–1.35)
GFR calc Af Amer: >90 mL/min (ref >90)
GFR, EST NON AFRICAN AMERICAN: 87 mL/min — AB (ref >90)
Glucose, Bld: 171 mg/dL — ABNORMAL HIGH (ref 70–99)
Potassium: 3.4 mEq/L — ABNORMAL LOW (ref 3.7–5.3)
SODIUM: 139 meq/L (ref 137–147)

## 2013-10-29 LAB — HEPATIC FUNCTION PANEL
ALT: 14 U/L (ref 0–53)
AST: 17 U/L (ref 0–37)
Albumin: 4 g/dL (ref 3.5–5.2)
Alkaline Phosphatase: 85 U/L (ref 39–117)
TOTAL PROTEIN: 7.6 g/dL (ref 6.0–8.3)
Total Bilirubin: 0.4 mg/dL (ref 0.3–1.2)

## 2013-10-29 LAB — URINALYSIS, ROUTINE W REFLEX MICROSCOPIC
Bilirubin Urine: NEGATIVE
Bilirubin Urine: NEGATIVE
Glucose, UA: NEGATIVE mg/dL
Glucose, UA: NEGATIVE mg/dL
HGB URINE DIPSTICK: NEGATIVE
Hgb urine dipstick: NEGATIVE
KETONES UR: 15 mg/dL — AB
Ketones, ur: 15 mg/dL — AB
LEUKOCYTES UA: NEGATIVE
Leukocytes, UA: NEGATIVE
NITRITE: NEGATIVE
Nitrite: NEGATIVE
PH: 7.5 (ref 5.0–8.0)
PH: 7 (ref 5.0–8.0)
PROTEIN: NEGATIVE mg/dL
PROTEIN: NEGATIVE mg/dL
SPECIFIC GRAVITY, URINE: 1.025 (ref 1.005–1.030)
Specific Gravity, Urine: 1.022 (ref 1.005–1.030)
Urobilinogen, UA: 0.2 mg/dL (ref 0.0–1.0)
Urobilinogen, UA: 0.2 mg/dL (ref 0.0–1.0)

## 2013-10-29 LAB — CBC
HCT: 41.7 % (ref 39.0–52.0)
Hemoglobin: 13.2 g/dL (ref 13.0–17.0)
MCH: 30.3 pg (ref 26.0–34.0)
MCHC: 31.7 g/dL (ref 30.0–36.0)
MCV: 95.6 fL (ref 78.0–100.0)
Platelets: 373 10*3/uL (ref 150–400)
RBC: 4.36 MIL/uL (ref 4.22–5.81)
RDW: 13.4 % (ref 11.5–15.5)
WBC: 19.9 10*3/uL — AB (ref 4.0–10.5)

## 2013-10-29 LAB — I-STAT CG4 LACTIC ACID, ED: LACTIC ACID, VENOUS: 1.47 mmol/L (ref 0.5–2.2)

## 2013-10-29 LAB — I-STAT TROPONIN, ED: Troponin i, poc: 0.01 ng/mL (ref 0.00–0.08)

## 2013-10-29 LAB — STREP PNEUMONIAE URINARY ANTIGEN: Strep Pneumo Urinary Antigen: NEGATIVE

## 2013-10-29 MED ORDER — MOMETASONE FURO-FORMOTEROL FUM 100-5 MCG/ACT IN AERO
2.0000 | INHALATION_SPRAY | Freq: Two times a day (BID) | RESPIRATORY_TRACT | Status: DC
  Administered 2013-10-30: 2 via RESPIRATORY_TRACT
  Filled 2013-10-29: qty 8.8

## 2013-10-29 MED ORDER — DEXTROSE 5 % IV SOLN
1.0000 g | INTRAVENOUS | Status: DC
  Administered 2013-10-30 – 2013-10-31 (×2): 1 g via INTRAVENOUS
  Filled 2013-10-29 (×3): qty 10

## 2013-10-29 MED ORDER — SODIUM CHLORIDE 0.9 % IV BOLUS (SEPSIS)
1000.0000 mL | Freq: Once | INTRAVENOUS | Status: AC
Start: 2013-10-29 — End: 2013-10-29
  Administered 2013-10-29: 1000 mL via INTRAVENOUS

## 2013-10-29 MED ORDER — ENOXAPARIN SODIUM 40 MG/0.4ML ~~LOC~~ SOLN
40.0000 mg | SUBCUTANEOUS | Status: DC
  Administered 2013-10-29 – 2013-10-31 (×3): 40 mg via SUBCUTANEOUS
  Filled 2013-10-29 (×4): qty 0.4

## 2013-10-29 MED ORDER — DEXTROSE 5 % IV SOLN: 500.0000 mg | INTRAVENOUS | Status: DC

## 2013-10-29 MED ORDER — INFLUENZA VAC SPLIT QUAD 0.5 ML IM SUSY
0.5000 mL | PREFILLED_SYRINGE | INTRAMUSCULAR | Status: AC
  Administered 2013-10-30: 0.5 mL via INTRAMUSCULAR
  Filled 2013-10-29: qty 0.5

## 2013-10-29 MED ORDER — SALINE SPRAY 0.65 % NA SOLN
1.0000 | NASAL | Status: DC | PRN
  Filled 2013-10-29: qty 44

## 2013-10-29 MED ORDER — IPRATROPIUM BROMIDE 0.02 % IN SOLN
0.5000 mg | RESPIRATORY_TRACT | Status: DC
  Administered 2013-10-29: 0.5 mg via RESPIRATORY_TRACT
  Filled 2013-10-29 (×2): qty 2.5

## 2013-10-29 MED ORDER — IPRATROPIUM BROMIDE 0.02 % IN SOLN
0.5000 mg | RESPIRATORY_TRACT | Status: DC
  Administered 2013-10-30 (×6): 0.5 mg via RESPIRATORY_TRACT
  Filled 2013-10-29 (×7): qty 2.5

## 2013-10-29 MED ORDER — VITAMIN D3 25 MCG (1000 UNIT) PO TABS
5000.0000 [IU] | ORAL_TABLET | Freq: Every day | ORAL | Status: DC
  Administered 2013-10-30 – 2013-11-01 (×3): 5000 [IU] via ORAL
  Filled 2013-10-29 (×3): qty 5

## 2013-10-29 MED ORDER — MENTHOL 3 MG MT LOZG
1.0000 | LOZENGE | OROMUCOSAL | Status: DC | PRN
  Filled 2013-10-29: qty 9

## 2013-10-29 MED ORDER — DEXTROSE 5 % IV SOLN: 1.0000 g | INTRAVENOUS | Status: DC

## 2013-10-29 MED ORDER — SODIUM CHLORIDE 0.9 % IV SOLN: INTRAVENOUS | Status: AC

## 2013-10-29 MED ORDER — SODIUM CHLORIDE 0.9 % IV SOLN: INTRAVENOUS | Status: DC

## 2013-10-29 MED ORDER — PANTOPRAZOLE SODIUM 40 MG PO TBEC
40.0000 mg | DELAYED_RELEASE_TABLET | Freq: Every day | ORAL | Status: DC
  Administered 2013-10-29: 40 mg via ORAL
  Filled 2013-10-29: qty 1

## 2013-10-29 MED ORDER — VITAMIN D-3 125 MCG (5000 UT) PO TABS: 5000.0000 [IU] | ORAL_TABLET | Freq: Every day | ORAL | Status: DC

## 2013-10-29 MED ORDER — LEVALBUTEROL HCL 0.63 MG/3ML IN NEBU
0.6300 mg | INHALATION_SOLUTION | Freq: Once | RESPIRATORY_TRACT | Status: AC
  Administered 2013-10-29: 0.63 mg via RESPIRATORY_TRACT
  Filled 2013-10-29: qty 3

## 2013-10-29 MED ORDER — PREDNISONE 20 MG PO TABS
60.0000 mg | ORAL_TABLET | Freq: Once | ORAL | Status: AC
  Administered 2013-10-29: 60 mg via ORAL
  Filled 2013-10-29: qty 3

## 2013-10-29 MED ORDER — BENZONATATE 100 MG PO CAPS
100.0000 mg | ORAL_CAPSULE | Freq: Three times a day (TID) | ORAL | Status: DC
  Administered 2013-10-29 – 2013-11-01 (×8): 100 mg via ORAL
  Filled 2013-10-29 (×12): qty 1

## 2013-10-29 MED ORDER — ALUM & MAG HYDROXIDE-SIMETH 200-200-20 MG/5ML PO SUSP
15.0000 mL | Freq: Four times a day (QID) | ORAL | Status: DC | PRN
  Administered 2013-10-29 – 2013-11-01 (×7): 15 mL via ORAL
  Filled 2013-10-29 (×7): qty 30

## 2013-10-29 MED ORDER — HYDROCHLOROTHIAZIDE 25 MG PO TABS
25.0000 mg | ORAL_TABLET | Freq: Every day | ORAL | Status: DC
  Administered 2013-10-30 – 2013-11-01 (×3): 25 mg via ORAL
  Filled 2013-10-29 (×4): qty 1

## 2013-10-29 MED ORDER — LEVALBUTEROL HCL 0.63 MG/3ML IN NEBU
0.6300 mg | INHALATION_SOLUTION | RESPIRATORY_TRACT | Status: DC
  Administered 2013-10-30 (×6): 0.63 mg via RESPIRATORY_TRACT
  Filled 2013-10-29 (×12): qty 3

## 2013-10-29 MED ORDER — GUAIFENESIN ER 600 MG PO TB12
600.0000 mg | ORAL_TABLET | Freq: Two times a day (BID) | ORAL | Status: DC
  Administered 2013-10-29 – 2013-11-01 (×6): 600 mg via ORAL
  Filled 2013-10-29 (×8): qty 1

## 2013-10-29 MED ORDER — METHYLPREDNISOLONE SODIUM SUCC 125 MG IJ SOLR
60.0000 mg | Freq: Two times a day (BID) | INTRAMUSCULAR | Status: DC
  Administered 2013-10-29 – 2013-10-31 (×4): 60 mg via INTRAVENOUS
  Filled 2013-10-29 (×3): qty 0.96
  Filled 2013-10-29: qty 2
  Filled 2013-10-29 (×2): qty 0.96

## 2013-10-29 MED ORDER — IPRATROPIUM-ALBUTEROL 0.5-2.5 (3) MG/3ML IN SOLN
3.0000 mL | Freq: Once | RESPIRATORY_TRACT | Status: AC
  Administered 2013-10-29: 3 mL via RESPIRATORY_TRACT
  Filled 2013-10-29: qty 3

## 2013-10-29 MED ORDER — POTASSIUM CHLORIDE CRYS ER 20 MEQ PO TBCR
40.0000 meq | EXTENDED_RELEASE_TABLET | Freq: Once | ORAL | Status: AC
  Administered 2013-10-29: 40 meq via ORAL
  Filled 2013-10-29: qty 2

## 2013-10-29 MED ORDER — VITAMIN C 500 MG PO TABS
500.0000 mg | ORAL_TABLET | Freq: Every day | ORAL | Status: DC
  Administered 2013-10-30 – 2013-11-01 (×3): 500 mg via ORAL
  Filled 2013-10-29 (×3): qty 1

## 2013-10-29 MED ORDER — LEVALBUTEROL HCL 0.63 MG/3ML IN NEBU: 0.6300 mg | INHALATION_SOLUTION | RESPIRATORY_TRACT | Status: DC | PRN

## 2013-10-29 MED ORDER — GUAIFENESIN-DM 100-10 MG/5ML PO SYRP: 5.0000 mL | ORAL_SOLUTION | ORAL | Status: DC | PRN

## 2013-10-29 MED ORDER — DEXTROSE 5 % IV SOLN
500.0000 mg | INTRAVENOUS | Status: DC
  Administered 2013-10-30: 500 mg via INTRAVENOUS
  Filled 2013-10-29 (×2): qty 500

## 2013-10-29 MED ORDER — LEVALBUTEROL HCL 0.63 MG/3ML IN NEBU
0.6300 mg | INHALATION_SOLUTION | RESPIRATORY_TRACT | Status: DC
  Administered 2013-10-29: 0.63 mg via RESPIRATORY_TRACT
  Filled 2013-10-29: qty 3

## 2013-10-29 MED ORDER — LEVOFLOXACIN IN D5W 750 MG/150ML IV SOLN
750.0000 mg | Freq: Once | INTRAVENOUS | Status: AC
  Administered 2013-10-29: 750 mg via INTRAVENOUS
  Filled 2013-10-29: qty 150

## 2013-10-29 NOTE — ED Notes (Signed)
Pt reports having sob and productive cough for a while, went to pcp and was on prednisone x 9 days with some improvement but now increase in symptoms past several days. spo2 90% on 2L [place].

## 2013-10-29 NOTE — ED Provider Notes (Signed)
CSN: [id number]     Arrival date & time 10/29/13  1325 History   First MD Initiated Contact with Patient 10/29/13 1631     Chief Complaint  Patient presents with  . Shortness of Breath  . Cough     (Consider location/radiation/quality/duration/timing/severity/associated sxs/prior Treatment) The history is provided by the patient.  [name] Taeden Geller. is a 73 y.o. male hx of COPD on home O2, HTN, DVT, here with worsening shortness of breath, cough, increased oxygen requirement. He finished a course of steroids for COPD about a week ago. Has been doing well until 2 days ago. He has been having productive cough with yellow and greenish sputum. Also has subjective fevers. Also has been using oxygen more often. Worsening shortness of breath this afternoon, not improved with nebs at home. Last admission was over 3 months ago for pneumonia.    Past Medical History  Diagnosis Date  . COPD (chronic obstructive pulmonary disease)   . Chronic bronchitis     "get it q yr" (07/17/2013)  . Hypertension   . DVT (deep venous thrombosis) 1990's    "?LLE"  . On home oxygen therapy     "2L 24/7 since Monday" (07/17/2013)  . Pneumonia     "several times"  . GERD (gastroesophageal reflux disease)     "related to RX I take" (07/17/2013)  . Daily headache   . Migraine     "all my life; no really bad migraines in 8-9 since work stress is gone" (07/17/2013)   Past Surgical History  Procedure Laterality Date  . Tonsillectomy  1949  . Inguinal hernia repair Left ~ 1990   Family History  Problem Relation Age of Onset  . Breast cancer Mother    History  Substance Use Topics  . Smoking status: Former Smoker -- 1.00 packs/day for 10 years    Types: Cigarettes    Quit date: 02/06/1998  . Smokeless tobacco: Never Used  . Alcohol Use: No    Review of Systems  Respiratory: Positive for cough and shortness of breath.   All other systems reviewed and are negative.     Allergies  Codeine  Home  Medications   Prior to Admission medications   Medication Sig Start Date End Date Taking? Authorizing Provider  albuterol (ACCUNEB) 0.63 MG/3ML nebulizer solution Take 1 ampule by nebulization every 6 (six) hours as needed for wheezing.    Historical Provider, MD  albuterol (PROVENTIL HFA;VENTOLIN HFA) 108 (90 BASE) MCG/ACT inhaler Inhale 2 puffs into the lungs every 6 (six) hours as needed for wheezing or shortness of breath.     Historical Provider, MD  Aspirin-Caffeine (BAYER BACK & BODY PAIN EX ST) 500-32.5 MG TABS Take 2 tablets by mouth 2 (two) times daily.    Historical Provider, MD  Calcium Carbonate Antacid (ANTACID PO) Take 1 tablet by mouth daily. For acid reflux.    Historical Provider, MD  Cholecalciferol (VITAMIN D-3) 5000 UNITS TABS Take 5,000 Units by mouth daily.     Historical Provider, MD  Fluticasone-Salmeterol (ADVAIR) 250-50 MCG/DOSE AEPB Inhale 1 puff into the lungs 2 (two) times daily.    Historical Provider, MD  hydrochlorothiazide (HYDRODIURIL) 25 MG tablet Take 25 mg by mouth daily.    Historical Provider, MD  predniSONE (DELTASONE) 10 MG tablet Take 2 tabs daily w/ food x 3 days, 1 tab daily w/ food daily x 3 days then stop 08/11/13   Oretha Milch, MD  tiotropium (SPIRIVA) 18 MCG inhalation capsule Place  18 mcg into inhaler and inhale daily.    Historical Provider, MD  vitamin C (ASCORBIC ACID) 500 MG tablet Take 500 mg by mouth daily.    Historical Provider, MD  VITAMIN E PO Take 1 tablet by mouth daily.    Historical Provider, MD   BP 171/88  Pulse 114  Temp(Src) 98.7 F (37.1 C) (Oral)  Resp 24  Ht  (1.727 m)  Wt 165 lb (74.844 kg)  BMI 25.09 kg/m2  SpO2 96% Physical Exam  Nursing note and vitals reviewed. Constitutional: He is oriented to person, place, and time.  tachypneic   HENT:  Head: Normocephalic.  Mouth/Throat: Oropharynx is clear and moist.  Eyes: Conjunctivae are normal. Pupils are equal, round, and reactive to light.  Neck: Normal range  of motion. Neck supple.  Cardiovascular: Regular rhythm and normal heart sounds.   tachy  Pulmonary/Chest:  Minimal wheezing, mod air movement. ? Crackles L base   Abdominal:  Firm, minimal diffuse tenderness, no rebound   Musculoskeletal: Normal range of motion. He exhibits no edema and no tenderness.  Neurological: He is alert and oriented to person, place, and time. No cranial nerve deficit.  Skin: Skin is warm and dry.  Psychiatric: He has a normal mood and affect. His behavior is normal. Judgment and thought content normal.    ED Course  Procedures (including critical care time) Labs Review Labs Reviewed  BASIC METABOLIC PANEL - Abnormal; Notable for the following:    Potassium 3.4 (*)    Glucose, Bld 171 (*)    GFR calc non Af Amer 87 (*)    All other components within normal limits  CBC - Abnormal; Notable for the following:    WBC 19.9 (*)    All other components within normal limits  CULTURE, BLOOD (ROUTINE X 2)  CULTURE, BLOOD (ROUTINE X 2)  I-STAT TROPOININ, ED  I-STAT CG4 LACTIC ACID, ED    Imaging Review Dg Chest 2 View (if Patient Has Fever And/or Copd)  10/29/2013   CLINICAL DATA:  Shortness of breath, cough  EXAM: CHEST  2 VIEW  COMPARISON:  08/06/2013  FINDINGS: Cardiomediastinal silhouette is stable. Hyperinflation again noted. There is stable chronic interstitial prominence. Stable scarring right perihilar region. There is new patchy infiltrate in lingula highly suspicious for pneumonia.  IMPRESSION: Hyperinflation. New patchy infiltrate in lingula highly suspicious for pneumonia. Follow-up to resolution is recommended.   Electronically Signed   By: Natasha Mead M.D.   On: 10/29/2013 14:24     EKG Interpretation None      MDM   Final diagnoses:  None    [name] [name]. is a 73 y.o. male here with cough, shortness of breath. Likely COPD exacerbation vs pneumonia. Will get labs, cxr.   5:04 PM WBC 19. CXR showed pneumonia. Oxygen was 90% on 2L and  requiring more oxygen in the ED. Will add lactate, cultures, give levaquin.   5:39 PM Will admit to tele.     Richardean Canal, MD 10/29/13 1739

## 2013-10-29 NOTE — ED Notes (Signed)
Lab results reported to Dr.Yao. 

## 2013-10-29 NOTE — H&P (Signed)
History and Physical       Hospital Admission Note Date: 10/29/2013  Patient name: [name] [name]. Medical record number: [id number] Date of birth: July 27, 1940 Age: 73 y.o. Gender: male  PCP: [name], NP    Chief Complaint:  Coughing with shortness of breath for 2 weeks  HPI: Patient is a 73 year old male with history of COPD, chronic respiratory failure on home O2, 2 L at night, hypertension, GERD, presented with coughing with productive phlegm, shortness of breath, wheezing and hypoxia. Patient reported that his symptoms actually started the first week of this month, he went to his PCP. Patient has just finished a course of steroids for COPD exacerbation a week ago. He denies being on antibiotics in the last 3 months. Patient reported that he was doing well in the last 2 days until today when he started having worsening wheezing and chest tightness. He endorsed having subjective fevers, cough with productive phlegm, greenish sputum. He used nebulizer treatments at home with no significant improvement. Chest x-ray showed hyperinflated lungs with a lingular pneumonia.  Review of Systems:  Constitutional: + fever, chills, diaphoresis, poor appetite and fatigue.  HEENT: Denies photophobia, eye pain, redness, hearing loss, ear pain, + congestion, sore throat, rhinorrhea, sneezing, mouth sores, trouble swallowing, neck pain, neck stiffness and tinnitus.   Respiratory: Please see history of present illness Cardiovascular: Denies chest pain, palpitations and leg swelling.  Gastrointestinal: Denies nausea, vomiting, abdominal pain, diarrhea, constipation, blood in stool and abdominal distention.  Genitourinary: Denies dysuria, urgency, frequency, hematuria, flank pain and difficulty urinating.  Musculoskeletal: Denies myalgias, back pain, joint swelling, arthralgias and gait problem.  Skin: Denies pallor, rash and wound.   Neurological: Denies dizziness, seizures, syncope, weakness, light-headedness, numbness and headaches.  Hematological: Denies adenopathy. Easy bruising, personal or family bleeding history  Psychiatric/Behavioral: Denies suicidal ideation, mood changes, confusion, nervousness, sleep disturbance and agitation  Past Medical History: Past Medical History  Diagnosis Date  . COPD (chronic obstructive pulmonary disease)   . Chronic bronchitis     "get it q yr" (07/17/2013)  . Hypertension   . DVT (deep venous thrombosis) 1990's    "?LLE"  . On home oxygen therapy     "2L 24/7 since Monday" (07/17/2013)  . Pneumonia     "several times"  . GERD (gastroesophageal reflux disease)     "related to RX I take" (07/17/2013)  . Daily headache   . Migraine     "all my life; no really bad migraines in 8-9 since work stress is gone" (07/17/2013)   Past Surgical History  Procedure Laterality Date  . Tonsillectomy  1949  . Inguinal hernia repair Left ~ 1990    Medications: Prior to Admission medications   Medication Sig Start Date End Date Taking? Authorizing Provider  albuterol (ACCUNEB) 0.63 MG/3ML nebulizer solution Take 1 ampule by nebulization every 6 (six) hours as needed for wheezing.    Historical Provider, MD  albuterol (PROVENTIL HFA;VENTOLIN HFA) 108 (90 BASE) MCG/ACT inhaler Inhale 2 puffs into the lungs every 6 (six) hours as needed for wheezing or shortness of breath.     Historical Provider, MD  Aspirin-Caffeine (BAYER BACK & BODY PAIN EX ST) 500-32.5 MG TABS Take 2 tablets by mouth 2 (two) times daily.    Historical Provider, MD  Calcium Carbonate Antacid (ANTACID PO) Take 1 tablet by mouth daily. For acid reflux.    Historical Provider, MD  Cholecalciferol (VITAMIN D-3) 5000 UNITS TABS Take 5,000 Units by mouth daily.  Historical Provider, MD  Fluticasone-Salmeterol (ADVAIR) 250-50 MCG/DOSE AEPB Inhale 1 puff into the lungs 2 (two) times daily.    Historical Provider, MD   hydrochlorothiazide (HYDRODIURIL) 25 MG tablet Take 25 mg by mouth daily.    Historical Provider, MD  predniSONE (DELTASONE) 10 MG tablet Take 2 tabs daily w/ food x 3 days, 1 tab daily w/ food daily x 3 days then stop 08/11/13   Oretha Milch, MD  tiotropium (SPIRIVA) 18 MCG inhalation capsule Place 18 mcg into inhaler and inhale daily.    Historical Provider, MD  vitamin C (ASCORBIC ACID) 500 MG tablet Take 500 mg by mouth daily.    Historical Provider, MD  VITAMIN E PO Take 1 tablet by mouth daily.    Historical Provider, MD    Allergies:   Allergies  Allergen Reactions  . Codeine Other (See Comments)    Hallucinations     Social History:  reports that he quit smoking about 15 years ago. His smoking use included Cigarettes. He has a 10 pack-year smoking history. He has never used smokeless tobacco. He reports that he does not drink alcohol or use illicit drugs.  Family History: Family History  Problem Relation Age of Onset  . Breast cancer Mother     Physical Exam: Blood pressure 140/71, pulse 100, temperature 98.7 F (37.1 C), temperature source Oral, resp. rate 30, height  (1.727 m), weight 74.844 kg (165 lb), SpO2 96.00%. General: Alert, awake, oriented x3, in no acute distress. HEENT: normocephalic, atraumatic, anicteric sclera, pink conjunctiva, pupils equal and reactive to light and accomodation, oropharynx clear Neck: supple, no masses or lymphadenopathy, no goiter, no bruits  Heart: Regular rate and rhythm, without murmurs, rubs or gallops. Lungs: Bilateral expiratory wheezing with crackles at the left lung base, poor air movement Abdomen: Soft, minimal abdominal tenderness, nondistended, positive bowel sounds Extremities: No clubbing, cyanosis or edema with positive pedal pulses. Neuro: Grossly intact, no focal neurological deficits, strength 5/5 upper and lower extremities bilaterally Psych: alert and oriented x 3, normal mood and affect Skin: no rashes or  lesions, warm and dry   LABS on Admission:  Basic Metabolic Panel:  Recent Labs Lab 10/29/13 1337  NA 139  K 3.4*  CL 96  CO2 29  GLUCOSE 171*  BUN 16  CREATININE 0.81  CALCIUM 9.5   Liver Function Tests:  Recent Labs Lab 10/29/13 1337  AST 17  ALT 14  ALKPHOS 85  BILITOT 0.4  PROT 7.6  ALBUMIN 4.0   No results found for this basename: LIPASE, AMYLASE,  in the last 168 hours No results found for this basename: AMMONIA,  in the last 168 hours CBC:  Recent Labs Lab 10/29/13 1337  WBC 19.9*  HGB 13.2  HCT 41.7  MCV 95.6  PLT 373   Cardiac Enzymes: No results found for this basename: CKTOTAL, CKMB, CKMBINDEX, TROPONINI,  in the last 168 hours BNP: No components found with this basename: POCBNP,  CBG: No results found for this basename: GLUCAP,  in the last 168 hours   Radiological Exams on Admission: Dg Chest 2 View (if Patient Has Fever And/or Copd)  10/29/2013   CLINICAL DATA:  Shortness of breath, cough  EXAM: CHEST  2 VIEW  COMPARISON:  08/06/2013  FINDINGS: Cardiomediastinal silhouette is stable. Hyperinflation again noted. There is stable chronic interstitial prominence. Stable scarring right perihilar region. There is new patchy infiltrate in lingula highly suspicious for pneumonia.  IMPRESSION: Hyperinflation. New patchy infiltrate in  lingula highly suspicious for pneumonia. Follow-up to resolution is recommended.   Electronically Signed   By: Natasha Mead M.D.   On: 10/29/2013 14:24    Assessment/Plan Principal Problem:   Acute respiratory failure: Secondary to community-acquired pneumonia and COPD exacerbation -Will admit for further management to telemetry given tachycardia, placed on scheduled bronchodilators with Xopenex and Atrovent, PRN Xopenex, IV Solu-Medrol, Dulera, O2 via nasal cannula - Obtain urine Legionella antigen, urine strep antigen, blood cultures, sputum cultures  - Placed on IV Rocephin and Zithromax   Active  Problems: Leukocytosis: - Possibly due to recently finished steroids course and pneumonia - Follow blood cultures, placed on IV Rocephin and Zithromax    HYPERTENSION - Continue HCTZ    GERD - Placed on PPI    Tachycardia - Placed on Xopenex bronchodilators, maintain on telemetry    Hypokalemia - Replaced  DVT prophylaxis: Lovenox  CODE STATUS: Full CODE STATUS, discussed with the patient  Family Communication: Admission, patients condition and plan of care including tests being ordered have been discussed with the patient  who indicates understanding and agree with the plan and Code Status   Further plan will depend as patient's clinical course evolves and further radiologic and laboratory data become available.   Time Spent on Admission: 1 hour  Basha Krygier M.D. [facility] 10/29/2013, 5:56 PM Pager: [phone]  If 7PM-7AM, please contact night-coverage [email or web address] Password [email or web address]

## 2013-10-30 ENCOUNTER — Encounter (HOSPITAL_COMMUNITY): Payer: Self-pay | Admitting: General Practice

## 2013-10-30 DIAGNOSIS — R0902 Hypoxemia: Secondary | ICD-10-CM

## 2013-10-30 DIAGNOSIS — I10 Essential (primary) hypertension: Secondary | ICD-10-CM

## 2013-10-30 DIAGNOSIS — J9620 Acute and chronic respiratory failure, unspecified whether with hypoxia or hypercapnia: Secondary | ICD-10-CM

## 2013-10-30 DIAGNOSIS — J189 Pneumonia, unspecified organism: Secondary | ICD-10-CM | POA: Diagnosis not present

## 2013-10-30 DIAGNOSIS — R000 Tachycardia, unspecified: Secondary | ICD-10-CM

## 2013-10-30 LAB — CBC
HCT: 37.8 % — ABNORMAL LOW (ref 39.0–52.0)
Hemoglobin: 12.2 g/dL — ABNORMAL LOW (ref 13.0–17.0)
MCH: 30.6 pg (ref 26.0–34.0)
MCHC: 32.3 g/dL (ref 30.0–36.0)
MCV: 94.7 fL (ref 78.0–100.0)
PLATELETS: 334 10*3/uL (ref 150–400)
RBC: 3.99 MIL/uL — ABNORMAL LOW (ref 4.22–5.81)
RDW: 13.4 % (ref 11.5–15.5)
WBC: 11.5 10*3/uL — ABNORMAL HIGH (ref 4.0–10.5)

## 2013-10-30 LAB — BASIC METABOLIC PANEL
Anion gap: 13 (ref 5–15)
BUN: 14 mg/dL (ref 6–23)
CO2: 26 mEq/L (ref 19–32)
Calcium: 9.7 mg/dL (ref 8.4–10.5)
Chloride: 96 mEq/L (ref 96–112)
Creatinine, Ser: 0.73 mg/dL (ref 0.50–1.35)
GFR calc non Af Amer: >90 mL/min (ref >90)
Glucose, Bld: 139 mg/dL — ABNORMAL HIGH (ref 70–99)
Potassium: 4.2 mEq/L (ref 3.7–5.3)
SODIUM: 135 meq/L — AB (ref 137–147)

## 2013-10-30 LAB — URINE CULTURE
Colony Count: NO GROWTH
Culture: NO GROWTH

## 2013-10-30 LAB — LEGIONELLA ANTIGEN, URINE: Legionella Antigen, Urine: NEGATIVE

## 2013-10-30 LAB — INFLUENZA PANEL BY PCR (TYPE A & B)
H1N1FLUPCR: NOT DETECTED
Influenza A By PCR: NEGATIVE
Influenza B By PCR: NEGATIVE

## 2013-10-30 MED ORDER — BUDESONIDE 0.25 MG/2ML IN SUSP
0.2500 mg | Freq: Two times a day (BID) | RESPIRATORY_TRACT | Status: DC
  Administered 2013-10-30 – 2013-11-01 (×3): 0.25 mg via RESPIRATORY_TRACT
  Filled 2013-10-30 (×7): qty 2

## 2013-10-30 MED ORDER — IPRATROPIUM BROMIDE 0.02 % IN SOLN
0.5000 mg | Freq: Four times a day (QID) | RESPIRATORY_TRACT | Status: DC
  Administered 2013-10-31 – 2013-11-01 (×7): 0.5 mg via RESPIRATORY_TRACT
  Filled 2013-10-30 (×7): qty 2.5

## 2013-10-30 MED ORDER — GI COCKTAIL ~~LOC~~
30.0000 mL | Freq: Two times a day (BID) | ORAL | Status: DC | PRN
  Administered 2013-10-30 – 2013-10-31 (×2): 30 mL via ORAL
  Filled 2013-10-30 (×4): qty 30

## 2013-10-30 MED ORDER — PANTOPRAZOLE SODIUM 40 MG PO TBEC
40.0000 mg | DELAYED_RELEASE_TABLET | Freq: Two times a day (BID) | ORAL | Status: DC
  Administered 2013-10-30 – 2013-11-01 (×4): 40 mg via ORAL
  Filled 2013-10-30 (×4): qty 1

## 2013-10-30 MED ORDER — LEVALBUTEROL HCL 0.63 MG/3ML IN NEBU
0.6300 mg | INHALATION_SOLUTION | Freq: Four times a day (QID) | RESPIRATORY_TRACT | Status: DC
  Administered 2013-10-31 – 2013-11-01 (×7): 0.63 mg via RESPIRATORY_TRACT
  Filled 2013-10-30 (×14): qty 3

## 2013-10-30 NOTE — Progress Notes (Signed)
UR completed Rajvi Armentor K. Shabana Armentrout, RN, BSN, MSHL, CCM  10/30/2013 10:33 AM

## 2013-10-30 NOTE — Progress Notes (Addendum)
[facility] PROGRESS NOTE  [name] [name]. ZOX:[id number] DOB: Feb 08, 1940 DOA: 10/29/2013 PCP: [name], NP  Assessment/Plan: Acute on chronic respiratory failure: Secondary to community-acquired pneumonia and COPD exacerbation  -Will continue IV steroids, PRN and scheduled bronchodilators with Xopenex and Atrovent, pulmicort, O2 via nasal cannula  - follow urine Legionella antigen, urine strep antigen, blood cultures, sputum cultures  - continue Rocephin and Zithromax  -start flutter valve and pulmicort  Leukocytosis:  - Possibly due to recently use of steroids and pneumonia  - Follow blood cultures, placed on IV Rocephin and Zithromax  -follow WBC's trend  HYPERTENSION  - Continue HCTZ   GERD  - Placed on PPI BID -continue maalox and PRN GI cocktail  Tachycardia  - Placed on Xopenex bronchodilators, most likely due to albuterol -monitor on telemetry  -if stable over next 24 hours will d/c tele  Hypokalemia  - Replaced   DVT prophylaxis: Lovenox   Code Status: Full Family Communication: no family at bedside Disposition Plan: home once resp status improved   Consultants:  None   Procedures:  See below for x-ray reports   Antibiotics:  Rocephin  zithromax   HPI/Subjective: Feeling better and breathing easier. No CP, no fever. Complaining of gas/GERD  Objective: Filed Vitals:   10/30/13 1432  BP: 130/73  Pulse: 94  Temp: 98.6 F (37 C)  Resp: 18    Intake/Output Summary (Last 24 hours) at 10/30/13 1617 Last data filed at 10/30/13 1607  Gross per 24 hour  Intake    650 ml  Output   1425 ml  Net   -775 ml   Filed Weights   10/29/13 1338 10/29/13 1939  Weight: 74.844 kg (165 lb) 77.1 kg (169 lb 15.6 oz)    Exam:   General:  No acute distress, feeling better and breathing easier. Complaints of gas/GERD  Cardiovascular: S1 and S2, no rubs or gallops; no JVD  Respiratory: positive rhonchi and exp wheezing; no  crackles  Abdomen: soft, NT, ND; positive BS  Musculoskeletal: no edema, no cyanosis   Data Reviewed: Basic Metabolic Panel:  Recent Labs Lab 10/29/13 1337 10/30/13 0525  NA 139 135*  K 3.4* 4.2  CL 96 96  CO2 29 26  GLUCOSE 171* 139*  BUN 16 14  CREATININE 0.81 0.73  CALCIUM 9.5 9.7   Liver Function Tests:  Recent Labs Lab 10/29/13 1337  AST 17  ALT 14  ALKPHOS 85  BILITOT 0.4  PROT 7.6  ALBUMIN 4.0   CBC:  Recent Labs Lab 10/29/13 1337 10/30/13 0525  WBC 19.9* 11.5*  HGB 13.2 12.2*  HCT 41.7 37.8*  MCV 95.6 94.7  PLT 373 334   BNP (last 3 results)  Recent Labs  12/21/12 1055 07/17/13 1110 08/06/13 1220  PROBNP 504.2* 296.9* 103.4    Recent Results (from the past 240 hour(s))  CULTURE, BLOOD (ROUTINE X 2)     Status: None   Collection Time    10/29/13  5:30 PM      Result Value Ref Range Status   Specimen Description BLOOD RIGHT FOREARM   Final   Special Requests BOTTLES DRAWN AEROBIC ONLY 5CC   Final   Culture  Setup Time     Final   Value: 10/29/2013 23:51     Performed at [facility]   Culture     Final   Value:        BLOOD CULTURE RECEIVED NO GROWTH TO DATE CULTURE WILL BE HELD  FOR 5 DAYS BEFORE ISSUING A FINAL NEGATIVE REPORT     Performed at [facility]   Report Status PENDING   Incomplete  CULTURE, BLOOD (ROUTINE X 2)     Status: None   Collection Time    10/29/13  5:43 PM      Result Value Ref Range Status   Specimen Description BLOOD ARM LEFT   Final   Special Requests BOTTLES DRAWN AEROBIC ONLY 10CC   Final   Culture  Setup Time     Final   Value: 10/29/2013 23:52     Performed at [facility]   Culture     Final   Value:        BLOOD CULTURE RECEIVED NO GROWTH TO DATE CULTURE WILL BE HELD FOR 5 DAYS BEFORE ISSUING A FINAL NEGATIVE REPORT     Performed at [facility]   Report Status PENDING   Incomplete     Studies: Dg Chest 2 View (if Patient Has Fever And/or Copd)  10/29/2013    CLINICAL DATA:  Shortness of breath, cough  EXAM: CHEST  2 VIEW  COMPARISON:  08/06/2013  FINDINGS: Cardiomediastinal silhouette is stable. Hyperinflation again noted. There is stable chronic interstitial prominence. Stable scarring right perihilar region. There is new patchy infiltrate in lingula highly suspicious for pneumonia.  IMPRESSION: Hyperinflation. New patchy infiltrate in lingula highly suspicious for pneumonia. Follow-up to resolution is recommended.   Electronically Signed   By: Natasha Mead M.D.   On: 10/29/2013 14:24    Scheduled Meds: . sodium chloride   Intravenous STAT  . azithromycin  500 mg Intravenous Q24H  . benzonatate  100 mg Oral TID  . budesonide (PULMICORT) nebulizer solution  0.25 mg Nebulization BID  . cefTRIAXone (ROCEPHIN)  IV  1 g Intravenous Q24H  . cholecalciferol  5,000 Units Oral Daily  . enoxaparin (LOVENOX) injection  40 mg Subcutaneous Q24H  . guaiFENesin  600 mg Oral BID  . hydrochlorothiazide  25 mg Oral Daily  . levalbuterol  0.63 mg Nebulization Q4H   And  . ipratropium  0.5 mg Nebulization Q4H  . methylPREDNISolone (SOLU-MEDROL) injection  60 mg Intravenous Q12H  . pantoprazole  40 mg Oral BID  . vitamin C  500 mg Oral Daily   Continuous Infusions:   Principal Problem:   Acute respiratory failure Active Problems:   HYPERTENSION   GERD   COPD exacerbation   Community acquired pneumonia   Tachycardia   Leukocytosis   Hypokalemia    Time spent: >30 minutes    Vassie Loll  [facility] Pager [phone]. If 7PM-7AM, please contact night-coverage at [email or web address], password [email or web address] 10/30/2013, 4:17 PM  LOS: 1 day

## 2013-10-30 NOTE — Evaluation (Signed)
Physical Therapy Evaluation Patient Details Name: [name] [name]. MRN: [id number] DOB: [birth year] Today's Date: 10/30/2013   History of Present Illness  Pt present with coughing with shortness of breath for 2 weeks PTA. He is on home O2 PRN per patient. Pt admitted with acute respiratory distress.   Clinical Impression  Pt admitted with the above. Pt currently with functional limitations due to the deficits listed below (see PT Problem List). At the time of PT eval pt was functionally doing well with transfers and ambulation, however was significantly limited by dropping O2 sats and decreased tolerance for functional activity. Pt will benefit from skilled PT to increase their independence and safety with mobility to allow discharge to the venue listed below.      Follow Up Recommendations No PT follow up    Equipment Recommendations  None recommended by PT    Recommendations for Other Services       Precautions / Restrictions Precautions Precautions: Fall Restrictions Weight Bearing Restrictions: No      Mobility  Bed Mobility               General bed mobility comments: Pt received sitting in recliner.   Transfers Overall transfer level: Modified independent Equipment used: None             General transfer comment: Pt was able to perform transfers with proper hand placement and safety awareness. Somewhat impulsive with initiating transfers, however feel this is due to high level of independence PTA.   Ambulation/Gait Ambulation/Gait assistance: Supervision Ambulation Distance (Feet): 50 Feet Assistive device: None Gait Pattern/deviations: Step-through pattern;Decreased stride length;Trunk flexed Gait velocity: Decreased Gait velocity interpretation: Below normal speed for age/gender General Gait Details: Pt was cued for pursed-lip breathing technique. He was ambulating on RA and sats dropped to mid-low 70's by return to room. Upon donning of supplemental O2  sats increased to low 90's within 1 minute.   Stairs            Wheelchair Mobility    Modified Rankin (Stroke Patients Only)       Balance Overall balance assessment: No apparent balance deficits (not formally assessed)                                           Pertinent Vitals/Pain Pain Assessment: No/denies pain    Home Living Family/patient expects to be discharged to:: Private residence Living Arrangements: Alone Available Help at Discharge: Family;Available PRN/intermittently Type of Home: House Home Access: Stairs to enter Entrance Stairs-Rails: Left Entrance Stairs-Number of Steps: 3 Home Layout: One level Home Equipment: Shower seat - built in;Other (comment) (Home O2) Additional Comments: has walk in shower with seat; standard toilets    Prior Function Level of Independence: Independent         Comments: Still driving, 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     Hand Dominance   Dominant Hand: Right    Extremity/Trunk Assessment   Upper Extremity Assessment: Defer to OT evaluation;Overall WFL for tasks assessed           Lower Extremity Assessment: Overall WFL for tasks assessed      Cervical / Trunk Assessment: Normal  Communication   Communication: No difficulties  Cognition Arousal/Alertness: Awake/alert Behavior During Therapy: WFL for tasks assessed/performed Overall Cognitive Status: Within Functional Limits for tasks assessed  General Comments      Exercises        Assessment/Plan    PT Assessment Patient needs continued PT services  PT Diagnosis Difficulty walking;Generalized weakness   PT Problem List Decreased activity tolerance;Decreased mobility;Decreased safety awareness;Decreased knowledge of precautions;Cardiopulmonary status limiting activity  PT Treatment Interventions DME instruction;Gait training;Stair training;Functional mobility training;Therapeutic  exercise;Therapeutic activities;Neuromuscular re-education;Patient/family education   PT Goals (Current goals can be found in the Care Plan section) Acute Rehab PT Goals Patient Stated Goal: Take care of his 2 acres of land PT Goal Formulation: With patient Time For Goal Achievement: 11/06/13 Potential to Achieve Goals: Good    Frequency Min 3X/week   Barriers to discharge        Co-evaluation               End of Session Equipment Utilized During Treatment: Gait belt;Oxygen Activity Tolerance: Patient tolerated treatment well Patient left: Other (comment);with call bell/phone within reach (Sitting EOB with tray table in front of him) Nurse Communication: Mobility status         Time: 1130-1155 PT Time Calculation (min): 25 min   Charges:   PT Evaluation $Initial PT Evaluation Tier I: 1 Procedure PT Treatments $Gait Training: 8-22 mins   PT G Codes:          Ruthann Cancer 10/30/2013, 1:33 PM  Ruthann Cancer, PT, DPT Acute Rehabilitation Services Pager: [phone]

## 2013-10-30 NOTE — Care Management Note (Addendum)
  Page 1 of 1   10/31/2013     3:08:16 PM CARE MANAGEMENT NOTE 10/31/2013  Patient:  [name] [name], [name] [name]   Account Number:  [id number]  Date Initiated:  10/30/2013  Documentation initiated by:  Donato Schultz  Subjective/Objective Assessment:   Acute Respiratory failure     Action/Plan:   CM to follow for dispostion needs   Anticipated DC Date:  11/02/2013   Anticipated DC Plan:  HOME/SELF CARE         Choice offered to / List presented to:             Status of service:  Completed, signed off Medicare Important Message given?  YES (If response is "NO", the following Medicare IM given date fields will be blank) Date Medicare IM given:  10/31/2013 Medicare IM given by:  Armonte Tortorella Date Additional Medicare IM given:   Additional Medicare IM given by:    Discharge Disposition:  HOME/SELF CARE  Per UR Regulation:  Reviewed for med. necessity/level of care/duration of stay  If discussed at Long Length of Stay Meetings, dates discussed:    Comments:  Lizann Edelman RN, BSN, MSHL, CCM  Nurse - Case Manager,  ([facility] [place])  [phone]  10/31/2013 Social:  From home Home oxygen in place prior to this admission PT RECS:  none Dispo Plan:  Home / Self Care.

## 2013-10-31 MED ORDER — ACETAMINOPHEN 325 MG PO TABS
650.0000 mg | ORAL_TABLET | Freq: Once | ORAL | Status: AC
  Administered 2013-10-31: 650 mg via ORAL
  Filled 2013-10-31: qty 2

## 2013-10-31 MED ORDER — PREDNISONE 50 MG PO TABS
60.0000 mg | ORAL_TABLET | Freq: Every day | ORAL | Status: DC
  Administered 2013-11-01: 60 mg via ORAL
  Filled 2013-10-31 (×2): qty 1

## 2013-10-31 MED ORDER — AZITHROMYCIN 500 MG PO TABS
500.0000 mg | ORAL_TABLET | Freq: Every day | ORAL | Status: DC
  Administered 2013-10-31: 500 mg via ORAL
  Filled 2013-10-31 (×2): qty 1

## 2013-10-31 NOTE — Progress Notes (Signed)
[facility] PROGRESS NOTE  [name] [name]. ZOX:[id number] DOB: [birth year] DOA: 10/29/2013 PCP: [name], NP  Assessment/Plan: Acute on chronic respiratory failure: Secondary to community-acquired pneumonia and COPD exacerbation  -Will continue IV steroids X1 more dose, and then switch to Po), PRN and scheduled bronchodilators with Xopenex,  Atrovent, pulmicort and O2 via nasal cannula  -influenza panel negative -urine Legionella antigen and urine strep antigen negative; blood cultures and sputum cultures w/o growth up to date -continue Rocephin and Zithromax  -continue flutter valve and if stable will d/c home in am  Leukocytosis:  - Possibly due to recently use of steroids and pneumonia  - Follow blood cultures, continue Rocephin and Zithromax  -follow WBC's trend -no fever  HYPERTENSION  - Continue HCTZ   GERD  -continue PPI BID -continue maalox and PRN GI cocktail  Tachycardia  - Placed on Xopenex bronchodilators, most likely due to albuterol -since has now been stable over next 24 hours will d/c tele  Hypokalemia  - Repleted  DVT prophylaxis: Lovenox   Code Status: Full Family Communication: no family at bedside Disposition Plan: home once resp status improved   Consultants:  None   Procedures:  See below for x-ray reports   Antibiotics:  Rocephin  zithromax   HPI/Subjective: Feeling a lot better and breathing easier. No CP, no fever. Improved GERD symptoms  Objective: Filed Vitals:   10/31/13 1035  BP: 109/68  Pulse: 84  Temp: 98.2 F (36.8 C)  Resp: 18    Intake/Output Summary (Last 24 hours) at 10/31/13 1249 Last data filed at 10/31/13 1148  Gross per 24 hour  Intake   1140 ml  Output   1240 ml  Net   -100 ml   Filed Weights   10/29/13 1338 10/29/13 1939 10/31/13 0500  Weight: 74.844 kg (165 lb) 77.1 kg (169 lb 15.6 oz) 71.9 kg (158 lb 8.2 oz)    Exam:   General:  No acute distress, feeling a lot better and  breathing easier. Improved Gas/GERD symptoms  Cardiovascular: S1 and S2, no rubs or gallops; no JVD  Respiratory: scattered rhonchi and very mild exp wheezing; no crackles  Abdomen: soft, NT, ND; positive BS  Musculoskeletal: no edema, no cyanosis   Data Reviewed: Basic Metabolic Panel:  Recent Labs Lab 10/29/13 1337 10/30/13 0525  NA 139 135*  K 3.4* 4.2  CL 96 96  CO2 29 26  GLUCOSE 171* 139*  BUN 16 14  CREATININE 0.81 0.73  CALCIUM 9.5 9.7   Liver Function Tests:  Recent Labs Lab 10/29/13 1337  AST 17  ALT 14  ALKPHOS 85  BILITOT 0.4  PROT 7.6  ALBUMIN 4.0   CBC:  Recent Labs Lab 10/29/13 1337 10/30/13 0525  WBC 19.9* 11.5*  HGB 13.2 12.2*  HCT 41.7 37.8*  MCV 95.6 94.7  PLT 373 334   BNP (last 3 results)  Recent Labs  12/21/12 1055 07/17/13 1110 08/06/13 1220  PROBNP 504.2* 296.9* 103.4    Recent Results (from the past 240 hour(s))  URINE CULTURE     Status: None   Collection Time    10/29/13  5:20 PM      Result Value Ref Range Status   Specimen Description URINE, RANDOM   Final   Special Requests NONE   Final   Culture  Setup Time     Final   Value: 10/29/2013 23:40     Performed at [facility] Count  Final   Value: NO GROWTH     Performed at [facility]   Culture     Final   Value: NO GROWTH     Performed at [facility]   Report Status 10/30/2013 FINAL   Final  CULTURE, BLOOD (ROUTINE X 2)     Status: None   Collection Time    10/29/13  5:30 PM      Result Value Ref Range Status   Specimen Description BLOOD RIGHT FOREARM   Final   Special Requests BOTTLES DRAWN AEROBIC ONLY 5CC   Final   Culture  Setup Time     Final   Value: 10/29/2013 23:51     Performed at [facility]   Culture     Final   Value:        BLOOD CULTURE RECEIVED NO GROWTH TO DATE CULTURE WILL BE HELD FOR 5 DAYS BEFORE ISSUING A FINAL NEGATIVE REPORT     Performed at [facility]   Report Status  PENDING   Incomplete  CULTURE, BLOOD (ROUTINE X 2)     Status: None   Collection Time    10/29/13  5:43 PM      Result Value Ref Range Status   Specimen Description BLOOD ARM LEFT   Final   Special Requests BOTTLES DRAWN AEROBIC ONLY 10CC   Final   Culture  Setup Time     Final   Value: 10/29/2013 23:52     Performed at [facility]   Culture     Final   Value:        BLOOD CULTURE RECEIVED NO GROWTH TO DATE CULTURE WILL BE HELD FOR 5 DAYS BEFORE ISSUING A FINAL NEGATIVE REPORT     Performed at [facility]   Report Status PENDING   Incomplete     Studies: Dg Chest 2 View (if Patient Has Fever And/or Copd)  10/29/2013   CLINICAL DATA:  Shortness of breath, cough  EXAM: CHEST  2 VIEW  COMPARISON:  08/06/2013  FINDINGS: Cardiomediastinal silhouette is stable. Hyperinflation again noted. There is stable chronic interstitial prominence. Stable scarring right perihilar region. There is new patchy infiltrate in lingula highly suspicious for pneumonia.  IMPRESSION: Hyperinflation. New patchy infiltrate in lingula highly suspicious for pneumonia. Follow-up to resolution is recommended.   Electronically Signed   By: Natasha Mead M.D.   On: 10/29/2013 14:24    Scheduled Meds: . azithromycin  500 mg Oral q1800  . benzonatate  100 mg Oral TID  . budesonide (PULMICORT) nebulizer solution  0.25 mg Nebulization BID  . cefTRIAXone (ROCEPHIN)  IV  1 g Intravenous Q24H  . cholecalciferol  5,000 Units Oral Daily  . enoxaparin (LOVENOX) injection  40 mg Subcutaneous Q24H  . guaiFENesin  600 mg Oral BID  . hydrochlorothiazide  25 mg Oral Daily  . levalbuterol  0.63 mg Nebulization Q6H   And  . ipratropium  0.5 mg Nebulization Q6H  . pantoprazole  40 mg Oral BID  . [START ON 11/01/2013] predniSONE  60 mg Oral Q breakfast  . vitamin C  500 mg Oral Daily   Continuous Infusions:   Principal Problem:   Acute respiratory failure Active Problems:   HYPERTENSION   GERD   COPD  exacerbation   Community acquired pneumonia   Tachycardia   Leukocytosis   Hypokalemia    Time spent: < 30 minutes    Vassie Loll  [facility] Pager [phone]. If  7PM-7AM, please contact night-coverage at [email or web address], password [facility] 10/31/2013, 12:49 PM  LOS: 2 days

## 2013-10-31 NOTE — Progress Notes (Signed)
1600-1900 shift. Pt.is A/Ox4 and is ambulatory without assistance. He had no c/o pain and no signs of distress. PRN medication was given for c/o indigestion.

## 2013-11-01 DIAGNOSIS — E876 Hypokalemia: Secondary | ICD-10-CM

## 2013-11-01 LAB — CBC
HEMATOCRIT: 38.8 % — AB (ref 39.0–52.0)
HEMOGLOBIN: 12 g/dL — AB (ref 13.0–17.0)
MCH: 30.5 pg (ref 26.0–34.0)
MCHC: 30.9 g/dL (ref 30.0–36.0)
MCV: 98.7 fL (ref 78.0–100.0)
Platelets: 297 10*3/uL (ref 150–400)
RBC: 3.93 MIL/uL — AB (ref 4.22–5.81)
RDW: 13.5 % (ref 11.5–15.5)
WBC: 10.9 10*3/uL — AB (ref 4.0–10.5)

## 2013-11-01 LAB — BASIC METABOLIC PANEL
Anion gap: 12 (ref 5–15)
BUN: 26 mg/dL — AB (ref 6–23)
CHLORIDE: 97 meq/L (ref 96–112)
CO2: 32 meq/L (ref 19–32)
CREATININE: 0.93 mg/dL (ref 0.50–1.35)
Calcium: 9.4 mg/dL (ref 8.4–10.5)
GFR calc Af Amer: >90 mL/min (ref >90)
GFR calc non Af Amer: 82 mL/min — ABNORMAL LOW (ref >90)
Glucose, Bld: 112 mg/dL — ABNORMAL HIGH (ref 70–99)
Potassium: 3.8 mEq/L (ref 3.7–5.3)
Sodium: 141 mEq/L (ref 137–147)

## 2013-11-01 MED ORDER — PANTOPRAZOLE SODIUM 40 MG PO TBEC: 40.0000 mg | DELAYED_RELEASE_TABLET | Freq: Two times a day (BID) | ORAL | Status: AC

## 2013-11-01 MED ORDER — PREDNISONE 20 MG PO TABS: ORAL_TABLET | ORAL | Status: DC

## 2013-11-01 MED ORDER — AMOXICILLIN-POT CLAVULANATE 875-125 MG PO TABS: 1.0000 | ORAL_TABLET | Freq: Two times a day (BID) | ORAL | Status: DC

## 2013-11-01 NOTE — Discharge Summary (Signed)
Physician Discharge Summary  [name] [name]. JXB:[id number] DOB: Feb 07, 1940 DOA: 10/29/2013  PCP: [name], NP  Admit date: 10/29/2013 Discharge date: 11/01/2013  Time spent: >30 minutes  Recommendations for Outpatient Follow-up:  Check CBC to follow WBC's trend Check BMET to follow electrolytes and renal function Follow up with pulmonary service as scheduled  Discharge Diagnoses:  Principal Problem:   Acute respiratory failure Active Problems:   HYPERTENSION   GERD   COPD exacerbation   Community acquired pneumonia   Tachycardia   Leukocytosis   Hypokalemia   Discharge Condition: stable and improved. Discharge home and will follow with PCP and pulmonary service as an outpatient.  Diet recommendation: heart healthy diet  Filed Weights   10/29/13 1939 10/31/13 0500 11/01/13 0441  Weight: 77.1 kg (169 lb 15.6 oz) 71.9 kg (158 lb 8.2 oz) 72 kg (158 lb 11.7 oz)    History of present illness:  73 year old male with history of COPD, chronic respiratory failure on home O2, 2 L at night, hypertension, GERD, presented with coughing with productive phlegm, shortness of breath, wheezing and hypoxia. Patient reported that his symptoms actually started the first week of this month, he went to his PCP. Patient has just finished a course of steroids for COPD exacerbation a week ago. He denies being on antibiotics in the last 3 months. Patient reported that he was doing well, until on day of admission when he started having worsening wheezing and chest tightness. He endorsed having subjective fevers, cough with productive phlegm and greenish sputum. He used nebulizer treatments at home with no significant improvement. Chest x-ray showed hyperinflated lungs with a lingular pneumonia.  Hospital Course:  Acute on chronic respiratory failure: Secondary to community-acquired pneumonia and COPD exacerbation  -Will discharge with steroids tapering, Scheduled bronchodilators and PRN  albuterol; continue advair and spiriva -continue 2-3L of chronic O2 via nasal cannula  -influenza panel negative  -urine Legionella antigen and urine strep antigen negative; blood cultures and sputum cultures w/o growth up to date  -will finalize abx therapy with augmentin   -follow up with pulmonary service and PCP as an outpatient  Leukocytosis:  - Possibly due to recently use of steroids and pneumonia  - Blood cultures has been neg up to date; will finish antibiotics therapy  -no fever  -at discharge WBC's essentially back to normal range  HYPERTENSION  -Continue HCTZ  -BP is well controlled  GERD  -continue PPI BID   Tachycardia  - Placed on Xopenex bronchodilators, most likely due to albuterol  -stable and resolved at discharge  Hypokalemia  - Repleted   Procedures: See below for x-ray reports  Consultations:  None   Discharge Exam: Filed Vitals:   11/01/13 1136  BP: 116/74  Pulse: 88  Temp:   Resp:    General: No acute distress, feeling a lot better and breathing easier. Improved Gas/GERD symptoms  Cardiovascular: S1 and S2, no rubs or gallops; no JVD  Respiratory: scattered rhonchi and no exp wheezing or crackles Abdomen: soft, NT, ND; positive BS  Musculoskeletal: no edema, no cyanosis    Discharge Instructions You were cared for by a hospitalist during your hospital stay. If you have any questions about your discharge medications or the care you received while you were in the hospital after you are discharged, you can call the unit and asked to speak with the hospitalist on call if the hospitalist that took care of you is not available. Once you are discharged, your primary care  physician will handle any further medical issues. Please note that NO REFILLS for any discharge medications will be authorized once you are discharged, as it is imperative that you return to your primary care physician (or establish a relationship with a primary care physician if  you do not have one) for your aftercare needs so that they can reassess your need for medications and monitor your lab values.  Discharge Instructions   Discharge instructions    Complete by:  As directed   Take medications as prescribed Maintain good hydration Arrange follow up with PCP in 10 days Increase activity as tolerated (slowly) Follow a low sodium heart healthy diet          Current Discharge Medication List    START taking these medications   Details  amoxicillin-clavulanate (AUGMENTIN) 875-125 MG per tablet Take 1 tablet by mouth 2 (two) times daily. Qty: 14 tablet, Refills: 0    pantoprazole (PROTONIX) 40 MG tablet Take 1 tablet (40 mg total) by mouth 2 (two) times daily. Qty: 60 tablet, Refills: 1    predniSONE (DELTASONE) 20 MG tablet Take 3 tablets by mouth daily X 1 day; then 2 tablets by mouth daily X 2 days; then 1 tablet by mouth daily X 3 days; then 1/2 tablet by mouth daily X 3 days and stop prednisone Qty: 15 tablet, Refills: 0      CONTINUE these medications which have NOT CHANGED   Details  albuterol (ACCUNEB) 0.63 MG/3ML nebulizer solution Take 1 ampule by nebulization every 6 (six) hours as needed for wheezing.    albuterol (PROVENTIL HFA;VENTOLIN HFA) 108 (90 BASE) MCG/ACT inhaler Inhale 2 puffs into the lungs every 6 (six) hours as needed for wheezing or shortness of breath.     Aspirin-Caffeine (BAYER BACK & BODY PAIN EX ST) 500-32.5 MG TABS Take 2 tablets by mouth 2 (two) times daily.    Calcium Carbonate Antacid (ANTACID PO) Take 1 tablet by mouth daily. For acid reflux.    Cholecalciferol (VITAMIN D-3) 5000 UNITS TABS Take 5,000 Units by mouth daily.     Fluticasone-Salmeterol (ADVAIR) 250-50 MCG/DOSE AEPB Inhale 1 puff into the lungs 2 (two) times daily.    guaiFENesin (MUCINEX) 600 MG 12 hr tablet Take 600 mg by mouth 2 (two) times daily.    hydrochlorothiazide (HYDRODIURIL) 25 MG tablet Take 25 mg by mouth daily.    ibuprofen  (ADVIL,MOTRIN) 200 MG tablet Take 800 mg by mouth every 6 (six) hours as needed for mild pain or moderate pain.    sodium chloride (OCEAN) 0.65 % SOLN nasal spray Place 1 spray into the nose as needed for congestion (Dry nasal passages).    tiotropium (SPIRIVA) 18 MCG inhalation capsule Place 18 mcg into inhaler and inhale daily.    vitamin C (ASCORBIC ACID) 500 MG tablet Take 500 mg by mouth daily.    VITAMIN E PO Take 1 tablet by mouth daily.      STOP taking these medications     dextromethorphan-guaiFENesin (MUCINEX DM) 30-600 MG per 12 hr tablet        Allergies  Allergen Reactions  . Codeine Other (See Comments)    Hallucinations    Follow-up Information   Follow up with Millsaps, Joelene Millin, NP. Schedule an appointment as soon as possible for a visit in 10 days.   Contact information:   [facility] [address] [place] [address] [phone]        The results of  significant diagnostics from this hospitalization (including imaging, microbiology, ancillary and laboratory) are listed below for reference.    Significant Diagnostic Studies: Dg Chest 2 View (if Patient Has Fever And/or Copd)  10/29/2013   CLINICAL DATA:  Shortness of breath, cough  EXAM: CHEST  2 VIEW  COMPARISON:  08/06/2013  FINDINGS: Cardiomediastinal silhouette is stable. Hyperinflation again noted. There is stable chronic interstitial prominence. Stable scarring right perihilar region. There is new patchy infiltrate in lingula highly suspicious for pneumonia.  IMPRESSION: Hyperinflation. New patchy infiltrate in lingula highly suspicious for pneumonia. Follow-up to resolution is recommended.   Electronically Signed   By: Natasha Mead M.D.   On: 10/29/2013 14:24    Microbiology: Recent Results (from the past 240 hour(s))  URINE CULTURE     Status: None   Collection Time    10/29/13  5:20 PM      Result Value Ref Range Status   Specimen Description URINE, RANDOM   Final    Special Requests NONE   Final   Culture  Setup Time     Final   Value: 10/29/2013 23:40     Performed at [facility] Count     Final   Value: NO GROWTH     Performed at [facility]   Culture     Final   Value: NO GROWTH     Performed at [facility]   Report Status 10/30/2013 FINAL   Final  CULTURE, BLOOD (ROUTINE X 2)     Status: None   Collection Time    10/29/13  5:30 PM      Result Value Ref Range Status   Specimen Description BLOOD RIGHT FOREARM   Final   Special Requests BOTTLES DRAWN AEROBIC ONLY 5CC   Final   Culture  Setup Time     Final   Value: 10/29/2013 23:51     Performed at [facility]   Culture     Final   Value:        BLOOD CULTURE RECEIVED NO GROWTH TO DATE CULTURE WILL BE HELD FOR 5 DAYS BEFORE ISSUING A FINAL NEGATIVE REPORT     Performed at [facility]   Report Status PENDING   Incomplete  CULTURE, BLOOD (ROUTINE X 2)     Status: None   Collection Time    10/29/13  5:43 PM      Result Value Ref Range Status   Specimen Description BLOOD ARM LEFT   Final   Special Requests BOTTLES DRAWN AEROBIC ONLY 10CC   Final   Culture  Setup Time     Final   Value: 10/29/2013 23:52     Performed at [facility]   Culture     Final   Value:        BLOOD CULTURE RECEIVED NO GROWTH TO DATE CULTURE WILL BE HELD FOR 5 DAYS BEFORE ISSUING A FINAL NEGATIVE REPORT     Performed at [facility]   Report Status PENDING   Incomplete     Labs: Basic Metabolic Panel:  Recent Labs Lab 10/29/13 1337 10/30/13 0525 11/01/13 0508  NA 139 135* 141  K 3.4* 4.2 3.8  CL 96 96 97  CO2 29 26 32  GLUCOSE 171* 139* 112*  BUN 16 14 26*  CREATININE 0.81 0.73 0.93  CALCIUM 9.5 9.7 9.4   Liver Function Tests:  Recent Labs Lab 10/29/13 1337  AST 17  ALT 14  ALKPHOS 85  BILITOT 0.4  PROT 7.6  ALBUMIN 4.0   CBC:  Recent Labs Lab 10/29/13 1337 10/30/13 0525 11/01/13 0508  WBC 19.9* 11.5* 10.9*  HGB  13.2 12.2* 12.0*  HCT 41.7 37.8* 38.8*  MCV 95.6 94.7 98.7  PLT 373 334 297   BNP (last 3 results)  Recent Labs  12/21/12 1055 07/17/13 1110 08/06/13 1220  PROBNP 504.2* 296.9* 103.4    Signed:  Vassie Loll  [facility] 11/01/2013, 2:28 PM

## 2013-11-04 LAB — CULTURE, BLOOD (ROUTINE X 2)
Culture: NO GROWTH
Culture: NO GROWTH

## 2013-11-05 DIAGNOSIS — J40 Bronchitis, not specified as acute or chronic: Secondary | ICD-10-CM | POA: Diagnosis not present

## 2013-11-05 DIAGNOSIS — J189 Pneumonia, unspecified organism: Secondary | ICD-10-CM | POA: Diagnosis not present

## 2013-11-05 DIAGNOSIS — J441 Chronic obstructive pulmonary disease with (acute) exacerbation: Secondary | ICD-10-CM | POA: Diagnosis not present

## 2013-11-11 ENCOUNTER — Other Ambulatory Visit: Payer: Self-pay | Admitting: Critical Care Medicine

## 2013-11-12 ENCOUNTER — Ambulatory Visit (INDEPENDENT_AMBULATORY_CARE_PROVIDER_SITE_OTHER)
Admission: RE | Admit: 2013-11-12 | Discharge: 2013-11-12 | Disposition: A | Discharge status: Home / Self Care | Payer: Medicare Other | Source: Ambulatory Visit | Attending: Critical Care Medicine | Admitting: Critical Care Medicine

## 2013-11-12 ENCOUNTER — Ambulatory Visit (INDEPENDENT_AMBULATORY_CARE_PROVIDER_SITE_OTHER): Payer: Medicare Other | Admitting: Critical Care Medicine

## 2013-11-12 ENCOUNTER — Encounter: Payer: Self-pay | Admitting: Critical Care Medicine

## 2013-11-12 ENCOUNTER — Telehealth: Payer: Self-pay | Admitting: Critical Care Medicine

## 2013-11-12 VITALS — BP 144/82 | HR 107 | Temp 97.1°F | Ht 67.0 in | Wt 167.4 lb

## 2013-11-12 DIAGNOSIS — J189 Pneumonia, unspecified organism: Secondary | ICD-10-CM | POA: Diagnosis not present

## 2013-11-12 DIAGNOSIS — J984 Other disorders of lung: Secondary | ICD-10-CM | POA: Diagnosis not present

## 2013-11-12 DIAGNOSIS — Z23 Encounter for immunization: Secondary | ICD-10-CM | POA: Diagnosis not present

## 2013-11-12 DIAGNOSIS — J449 Chronic obstructive pulmonary disease, unspecified: Secondary | ICD-10-CM | POA: Diagnosis not present

## 2013-11-12 DIAGNOSIS — J90 Pleural effusion, not elsewhere classified: Secondary | ICD-10-CM | POA: Diagnosis not present

## 2013-11-12 MED ORDER — ARFORMOTEROL TARTRATE 15 MCG/2ML IN NEBU: 15.0000 ug | INHALATION_SOLUTION | Freq: Two times a day (BID) | RESPIRATORY_TRACT | Status: DC

## 2013-11-12 NOTE — Progress Notes (Signed)
Quick Note:  Notify the patient that the Xray is stable and pneumonia is improved No change in medications are recommended. Continue current meds as prescribed at last office visit ______

## 2013-11-12 NOTE — Progress Notes (Signed)
Subjective:    Patient ID: [name] MorningEddie Schwark Jr., male    DOB: [birth year], 73 y.o.   MRN: [id number]  HPI 11/12/2013 Chief Complaint  Patient presents with  . Post Hospital Follow up    [facility]  o2 sat 85% RA upon arrival to exam room.  SOB and wheezing have improved over the past few days.  Occas dry cough.  No chest tightness/pain.   Pt in hosp for L lung pna. D/c on pred and abx.  9/23 --9/26.   2nd hosp visit in 6 months and one other ED visit. Pt tends to decline quickly.  Pt now is better after d/c. Now on PPI bid.   On advair and spiriva.  Pt had flu vaccine in hosp.  No real mucus now.  Less dyspneic  On oxygen 2L continuous  sats 85% on RA today This patient continues to use and benefit from her oxygen therapy and has re qualified at this visit for continued oxygen therapy  Review of Systems Constitutional:   No  weight loss, night sweats,  Fevers, chills, fatigue, lassitude. HEENT:   No headaches,  Difficulty swallowing,  Tooth/dental problems,  Sore throat,                No sneezing, itching, ear ache, nasal congestion, post nasal drip,   CV:  No chest pain,  Orthopnea, PND, swelling in lower extremities, anasarca, dizziness, palpitations  GI  No heartburn, indigestion, abdominal pain, nausea, vomiting, diarrhea, change in bowel habits, loss of appetite  Resp: Notes  shortness of breath with exertion not at rest.  No excess mucus, no productive cough,  Notes        non-productive cough,  No coughing up of blood.  No change in color of mucus.  No wheezing.  No chest wall deformity  Skin: no rash or lesions.  GU: no dysuria, change in color of urine, no urgency or frequency.  No flank pain.  MS:  No joint pain or swelling.  No decreased range of motion.  No back pain.  Psych:  No change in mood or affect. No depression or anxiety.  No memory loss.     Objective:   Physical Exam Filed Vitals:   11/12/13 0855 11/12/13 0900  BP:  144/82  Pulse: 117  107  Temp:  97.1 F (36.2 C)  TempSrc:  Oral  Height:  5\' 7"  (1.702 m)  Weight:  167 lb 6.4 oz (75.932 kg)  SpO2: 85% 94%    Gen: Pleasant, well-nourished, in no distress,  normal affect  ENT: No lesions,  mouth clear,  oropharynx clear, no postnasal drip  Neck: No JVD, no TMG, no carotid bruits  Lungs: No use of accessory muscles, no dullness to percussion, distant breath sounds  Cardiovascular: RRR, heart sounds normal, no murmur or gallops, no peripheral edema  Abdomen: soft and NT, no HSM,  BS normal  Musculoskeletal: No deformities, no cyanosis or clubbing  Neuro: alert, non focal  Skin: Warm, no lesions or rashes  Dg Chest 2 View  11/12/2013   CLINICAL DATA:  Follow-up pneumonia, history of hypertension and COPD  EXAM: CHEST  2 VIEW  COMPARISON:  10/29/2013, 08/06/2013  FINDINGS: The lungs are hyperinflated likely secondary to COPD. There is mild persistent lingular airspace disease. There is no other focal consolidation, pleural effusion or pneumothorax. The heart and mediastinal contours are unremarkable.  The osseous structures are unremarkable.  IMPRESSION: 1. Near complete interval resolution  of lingular pneumonia with a small area of persistent airspace disease in the lingula. Continued follow-up radiography recommended.   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 albuterol (ACCUNEB) 0.63 MG/3ML nebulizer solution Take 1 ampule by nebulization every 6 (six) hours as needed for wheezing.  Marland Kitchen albuterol (PROVENTIL HFA;VENTOLIN HFA) 108 (90 BASE) MCG/ACT inhaler Inhale 2 puffs into the lungs every 6 (six) hours as needed for wheezing or shortness of breath.   . Aspirin-Caffeine (BAYER BACK & BODY PAIN EX ST) 500-32.5 MG TABS Take 2 tablets by mouth 2 (two) times daily as needed.   . Cholecalciferol (VITAMIN D-3) 5000 UNITS TABS Take 5,000 Units by mouth daily.   Marland Kitchen guaiFENesin (MUCINEX) 600 MG 12 hr tablet Take 600 mg by mouth 2 (two) times daily.  . hydrochlorothiazide (HYDRODIURIL) 25 MG tablet Take 25 mg by mouth daily.  Marland Kitchen ibuprofen (ADVIL,MOTRIN) 200 MG tablet Take 800 mg by mouth every 6 (six) hours as needed for mild pain or moderate pain.  . pantoprazole (PROTONIX) 40 MG tablet Take 1 tablet (40 mg total) by mouth 2 (two) times daily.  . sodium chloride (OCEAN) 0.65 % SOLN nasal spray Place 1 spray into the nose as needed for congestion (Dry nasal passages).  Marland Kitchen tiotropium (SPIRIVA) 18 MCG inhalation capsule Place 18 mcg into inhaler and inhale daily.  . vitamin C (ASCORBIC ACID) 500 MG tablet Take 500 mg by mouth daily.  Marland Kitchen VITAMIN E PO Take 1 tablet by mouth daily.  . [DISCONTINUED] Fluticasone-Salmeterol (ADVAIR) 250-50 MCG/DOSE AEPB Inhale 1 puff into the lungs 2 (two) times daily.  Marland Kitchen arformoterol (BROVANA) 15 MCG/2ML NEBU Take 2 mLs (15 mcg total) by nebulization 2 (two) times daily.  . [DISCONTINUED] amoxicillin-clavulanate (AUGMENTIN) 875-125 MG per tablet Take 1 tablet by mouth 2 (two) times daily.  . [DISCONTINUED] Calcium Carbonate Antacid (ANTACID PO) Take 1 tablet by mouth daily. For acid reflux.  . [DISCONTINUED] predniSONE (DELTASONE) 20 MG tablet Take 3 tablets by mouth daily X 1 day; then 2 tablets by mouth daily X 2 days; then 1 tablet by mouth daily X 3  days; then 1/2 tablet by mouth daily X 3 days and stop prednisone

## 2013-11-12 NOTE — Assessment & Plan Note (Addendum)
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

## 2013-11-12 NOTE — Progress Notes (Signed)
Quick Note:  Called pt - went directly VM.  lmomtcb for pt ______

## 2013-11-12 NOTE — Patient Instructions (Signed)
Prevnar 13 given Stop advair Start Brovana one in nebulizer twice daily Chest xray today Stay on spiriva Stay on 2Liter oxygen Return 2 months

## 2013-11-12 NOTE — Assessment & Plan Note (Signed)
History of left lingular pneumonia with acute hypoxemic respiratory failure now improved  Plan  No additional antibiotics are indicated Note followup chest x-ray shows clearance of left lingular infiltrate

## 2013-11-14 NOTE — Progress Notes (Signed)
Quick Note:  lmtcb ______ 

## 2013-11-14 NOTE — Telephone Encounter (Signed)
lmtcb for pt.     Notes Recorded by Raford Pitcherrystal Dawn Jones, RN on 11/12/2013 at 12:39 PM Called pt - went directly VM.  lmomtcb for pt ------  Notes Recorded by Storm FriskPatrick E Wright, MD on 11/12/2013 at 10:55 AM Notify the patient that the Xray is stable and pneumonia is improved No change in medications are recommended. Continue current meds as prescribed at last office visit

## 2013-11-14 NOTE — Telephone Encounter (Signed)
Pt advised. Jennifer Castillo, CMA  

## 2013-11-17 ENCOUNTER — Telehealth: Payer: Self-pay | Admitting: Critical Care Medicine

## 2013-11-17 MED ORDER — PREDNISONE 10 MG PO TABS: ORAL_TABLET | ORAL | Status: DC

## 2013-11-17 NOTE — Telephone Encounter (Signed)
Called pt. Aware of recs. RX sent into the pharmacy. Nothing further needed

## 2013-11-17 NOTE — Telephone Encounter (Signed)
Called and spoke to pt. Pt had appt on 10/7 with PW. Pt stated since being started on Brovana neb BID and d/c'ing the advair, pt feels his breathing is not being controlled well enough. Pt states he is having a little bit more SOB with activity, mild prod cough with white frothy mucus and was unable to sleep well last night. I advised pt that I could get recs from another physician d/t PW not being in office this afternoon. Pt specifically requested PW advise and recs. Pt aware PW is in office Rosalita Levan([place]) tomorrow (10/13) and could give recs then. Pt verbalized understanding. Pt would like to be called after 3pm tomorrow 10/13.   PW please advise.   Allergies  Allergen Reactions  . Codeine Other (See Comments)    Hallucinations

## 2013-11-17 NOTE — Telephone Encounter (Signed)
Call in prednisone 10mg  Take 4 for three days 3 for three days 2 for three days 1 for three days and stop #30 Stay on brovana

## 2013-11-17 NOTE — Progress Notes (Signed)
Quick Note:  Pt aware of results - see phone msg from 11/12/13. ______

## 2013-11-22 ENCOUNTER — Other Ambulatory Visit: Payer: Self-pay | Admitting: Critical Care Medicine

## 2013-11-24 DIAGNOSIS — J441 Chronic obstructive pulmonary disease with (acute) exacerbation: Secondary | ICD-10-CM | POA: Diagnosis not present

## 2014-01-06 DIAGNOSIS — J9601 Acute respiratory failure with hypoxia: Secondary | ICD-10-CM

## 2014-01-06 HISTORY — DX: Acute respiratory failure with hypoxia: J96.01

## 2014-01-15 DIAGNOSIS — I10 Essential (primary) hypertension: Secondary | ICD-10-CM | POA: Diagnosis not present

## 2014-01-15 DIAGNOSIS — J441 Chronic obstructive pulmonary disease with (acute) exacerbation: Secondary | ICD-10-CM | POA: Diagnosis not present

## 2014-01-19 ENCOUNTER — Emergency Department (HOSPITAL_COMMUNITY): Payer: Medicare Other

## 2014-01-19 ENCOUNTER — Encounter (HOSPITAL_COMMUNITY): Payer: Self-pay | Admitting: *Deleted

## 2014-01-19 ENCOUNTER — Inpatient Hospital Stay (HOSPITAL_COMMUNITY)
Admission: EM | Admit: 2014-01-19 | Discharge: 2014-01-21 | DRG: 189 | Disposition: A | Discharge status: Home / Self Care | Payer: Medicare Other | Attending: Internal Medicine | Admitting: Internal Medicine

## 2014-01-19 DIAGNOSIS — K219 Gastro-esophageal reflux disease without esophagitis: Secondary | ICD-10-CM | POA: Diagnosis present

## 2014-01-19 DIAGNOSIS — Z9981 Dependence on supplemental oxygen: Secondary | ICD-10-CM

## 2014-01-19 DIAGNOSIS — J9600 Acute respiratory failure, unspecified whether with hypoxia or hypercapnia: Secondary | ICD-10-CM | POA: Diagnosis present

## 2014-01-19 DIAGNOSIS — I10 Essential (primary) hypertension: Secondary | ICD-10-CM | POA: Diagnosis present

## 2014-01-19 DIAGNOSIS — Z87891 Personal history of nicotine dependence: Secondary | ICD-10-CM | POA: Diagnosis not present

## 2014-01-19 DIAGNOSIS — R1084 Generalized abdominal pain: Secondary | ICD-10-CM | POA: Diagnosis not present

## 2014-01-19 DIAGNOSIS — R109 Unspecified abdominal pain: Secondary | ICD-10-CM | POA: Diagnosis not present

## 2014-01-19 DIAGNOSIS — J9601 Acute respiratory failure with hypoxia: Principal | ICD-10-CM | POA: Diagnosis present

## 2014-01-19 DIAGNOSIS — J449 Chronic obstructive pulmonary disease, unspecified: Secondary | ICD-10-CM | POA: Diagnosis not present

## 2014-01-19 DIAGNOSIS — J441 Chronic obstructive pulmonary disease with (acute) exacerbation: Secondary | ICD-10-CM | POA: Diagnosis present

## 2014-01-19 DIAGNOSIS — R0602 Shortness of breath: Secondary | ICD-10-CM | POA: Diagnosis not present

## 2014-01-19 DIAGNOSIS — Z86718 Personal history of other venous thrombosis and embolism: Secondary | ICD-10-CM | POA: Diagnosis present

## 2014-01-19 DIAGNOSIS — J9602 Acute respiratory failure with hypercapnia: Secondary | ICD-10-CM

## 2014-01-19 DIAGNOSIS — R059 Cough, unspecified: Secondary | ICD-10-CM

## 2014-01-19 DIAGNOSIS — R140 Abdominal distension (gaseous): Secondary | ICD-10-CM | POA: Diagnosis not present

## 2014-01-19 DIAGNOSIS — Z885 Allergy status to narcotic agent status: Secondary | ICD-10-CM

## 2014-01-19 DIAGNOSIS — E876 Hypokalemia: Secondary | ICD-10-CM | POA: Diagnosis present

## 2014-01-19 DIAGNOSIS — R05 Cough: Secondary | ICD-10-CM | POA: Diagnosis not present

## 2014-01-19 DIAGNOSIS — I82409 Acute embolism and thrombosis of unspecified deep veins of unspecified lower extremity: Secondary | ICD-10-CM

## 2014-01-19 DIAGNOSIS — Z720 Tobacco use: Secondary | ICD-10-CM | POA: Diagnosis present

## 2014-01-19 DIAGNOSIS — Z7952 Long term (current) use of systemic steroids: Secondary | ICD-10-CM

## 2014-01-19 DIAGNOSIS — R112 Nausea with vomiting, unspecified: Secondary | ICD-10-CM | POA: Diagnosis present

## 2014-01-19 HISTORY — DX: Acute respiratory failure with hypoxia: J96.01

## 2014-01-19 LAB — CBC WITH DIFFERENTIAL/PLATELET
Basophils Absolute: 0 10*3/uL (ref 0.0–0.1)
Basophils Relative: 0 % (ref 0–1)
Eosinophils Absolute: 0 10*3/uL (ref 0.0–0.7)
Eosinophils Relative: 0 % (ref 0–5)
HCT: 41.4 % (ref 39.0–52.0)
Hemoglobin: 13.4 g/dL (ref 13.0–17.0)
Lymphocytes Relative: 5 % — ABNORMAL LOW (ref 12–46)
Lymphs Abs: 0.8 10*3/uL (ref 0.7–4.0)
MCH: 30.9 pg (ref 26.0–34.0)
MCHC: 32.4 g/dL (ref 30.0–36.0)
MCV: 95.6 fL (ref 78.0–100.0)
Monocytes Absolute: 0.5 10*3/uL (ref 0.1–1.0)
Monocytes Relative: 3 % (ref 3–12)
Neutro Abs: 13.9 10*3/uL — ABNORMAL HIGH (ref 1.7–7.7)
Neutrophils Relative %: 92 % — ABNORMAL HIGH (ref 43–77)
Platelets: 288 10*3/uL (ref 150–400)
RBC: 4.33 MIL/uL (ref 4.22–5.81)
RDW: 14.5 % (ref 11.5–15.5)
WBC: 15.1 10*3/uL — ABNORMAL HIGH (ref 4.0–10.5)

## 2014-01-19 LAB — BASIC METABOLIC PANEL
Anion gap: 15 (ref 5–15)
BUN: 23 mg/dL (ref 6–23)
CHLORIDE: 97 meq/L (ref 96–112)
CO2: 27 mEq/L (ref 19–32)
CREATININE: 0.8 mg/dL (ref 0.50–1.35)
Calcium: 9.6 mg/dL (ref 8.4–10.5)
GFR calc non Af Amer: 87 mL/min — ABNORMAL LOW (ref >90)
GLUCOSE: 116 mg/dL — AB (ref 70–99)
Potassium: 3.8 mEq/L (ref 3.7–5.3)
Sodium: 139 mEq/L (ref 137–147)

## 2014-01-19 LAB — TROPONIN I
Troponin I: <0.3 ng/mL (ref <0.30)
Troponin I: <0.3 ng/mL (ref <0.30)

## 2014-01-19 LAB — D-DIMER, QUANTITATIVE: D-Dimer, Quant: 0.27 ug/mL-FEU (ref 0.00–0.48)

## 2014-01-19 MED ORDER — IPRATROPIUM BROMIDE 0.02 % IN SOLN: 0.5000 mg | Freq: Four times a day (QID) | RESPIRATORY_TRACT | Status: DC | PRN

## 2014-01-19 MED ORDER — PREDNISONE 10 MG PO TABS
10.0000 mg | ORAL_TABLET | Freq: Two times a day (BID) | ORAL | Status: DC
  Filled 2014-01-19 (×2): qty 1

## 2014-01-19 MED ORDER — PREDNISONE (PAK) 10 MG PO TABS
100.0000 mg | ORAL_TABLET | Freq: Two times a day (BID) | ORAL | Status: DC
  Filled 2014-01-19: qty 21

## 2014-01-19 MED ORDER — HEPARIN SODIUM (PORCINE) 5000 UNIT/ML IJ SOLN
5000.0000 [IU] | Freq: Three times a day (TID) | INTRAMUSCULAR | Status: DC
  Administered 2014-01-19 – 2014-01-21 (×5): 5000 [IU] via SUBCUTANEOUS
  Filled 2014-01-19 (×7): qty 1

## 2014-01-19 MED ORDER — ALBUTEROL SULFATE (2.5 MG/3ML) 0.083% IN NEBU: 3.0000 mL | INHALATION_SOLUTION | Freq: Four times a day (QID) | RESPIRATORY_TRACT | Status: DC | PRN

## 2014-01-19 MED ORDER — SALINE SPRAY 0.65 % NA SOLN
1.0000 | NASAL | Status: DC | PRN
  Filled 2014-01-19 (×2): qty 44

## 2014-01-19 MED ORDER — VITAMIN C 500 MG PO TABS
500.0000 mg | ORAL_TABLET | Freq: Every day | ORAL | Status: DC
  Administered 2014-01-19 – 2014-01-21 (×3): 500 mg via ORAL
  Filled 2014-01-19 (×3): qty 1

## 2014-01-19 MED ORDER — IPRATROPIUM-ALBUTEROL 0.5-2.5 (3) MG/3ML IN SOLN: 3.0000 mL | RESPIRATORY_TRACT | Status: DC

## 2014-01-19 MED ORDER — ALBUTEROL (5 MG/ML) CONTINUOUS INHALATION SOLN
15.0000 mg/h | INHALATION_SOLUTION | RESPIRATORY_TRACT | Status: DC
  Administered 2014-01-19: 15 mg/h via RESPIRATORY_TRACT
  Filled 2014-01-19: qty 20

## 2014-01-19 MED ORDER — SODIUM CHLORIDE 0.9 % IJ SOLN
3.0000 mL | Freq: Two times a day (BID) | INTRAMUSCULAR | Status: DC
  Administered 2014-01-19 – 2014-01-21 (×4): 3 mL via INTRAVENOUS

## 2014-01-19 MED ORDER — PREDNISONE 20 MG PO TABS
40.0000 mg | ORAL_TABLET | Freq: Once | ORAL | Status: AC
  Administered 2014-01-19: 40 mg via ORAL
  Filled 2014-01-19: qty 2

## 2014-01-19 MED ORDER — PANTOPRAZOLE SODIUM 40 MG PO TBEC
40.0000 mg | DELAYED_RELEASE_TABLET | Freq: Two times a day (BID) | ORAL | Status: DC
  Administered 2014-01-19 – 2014-01-21 (×4): 40 mg via ORAL
  Filled 2014-01-19 (×4): qty 1

## 2014-01-19 MED ORDER — IPRATROPIUM-ALBUTEROL 0.5-2.5 (3) MG/3ML IN SOLN
9.0000 mL | RESPIRATORY_TRACT | Status: DC
  Administered 2014-01-19 – 2014-01-20 (×3): 9 mL via RESPIRATORY_TRACT
  Administered 2014-01-20: 3 mL via RESPIRATORY_TRACT
  Filled 2014-01-19: qty 9
  Filled 2014-01-19 (×2): qty 3
  Filled 2014-01-19: qty 9
  Filled 2014-01-19: qty 3

## 2014-01-19 MED ORDER — HYDROCHLOROTHIAZIDE 25 MG PO TABS
25.0000 mg | ORAL_TABLET | Freq: Every day | ORAL | Status: DC
  Administered 2014-01-20 – 2014-01-21 (×2): 25 mg via ORAL
  Filled 2014-01-19 (×2): qty 1

## 2014-01-19 MED ORDER — ASPIRIN-CAFFEINE 500-32.5 MG PO TABS: 2.0000 | ORAL_TABLET | Freq: Two times a day (BID) | ORAL | Status: DC | PRN

## 2014-01-19 MED ORDER — MOMETASONE FURO-FORMOTEROL FUM 100-5 MCG/ACT IN AERO
2.0000 | INHALATION_SPRAY | Freq: Two times a day (BID) | RESPIRATORY_TRACT | Status: DC
Start: 2014-01-19 — End: 2014-01-21
  Administered 2014-01-19 – 2014-01-21 (×4): 2 via RESPIRATORY_TRACT
  Filled 2014-01-19: qty 8.8

## 2014-01-19 MED ORDER — TIOTROPIUM BROMIDE MONOHYDRATE 18 MCG IN CAPS
18.0000 ug | ORAL_CAPSULE | Freq: Every day | RESPIRATORY_TRACT | Status: DC
  Filled 2014-01-19: qty 5

## 2014-01-19 NOTE — ED Notes (Signed)
Admitting MD at the bedside.  

## 2014-01-19 NOTE — ED Provider Notes (Signed)
CSN: [id number]     Arrival date & time 01/19/14  1050 History   First MD Initiated Contact with Patient 01/19/14 1056     Chief Complaint  Patient presents with  . Shortness of Breath     (Consider location/radiation/quality/duration/timing/severity/associated sxs/prior Treatment) Patient is a 73 y.o. male presenting with shortness of breath.  Shortness of Breath Severity:  Mild Onset quality:  Gradual Duration:  4 days Timing:  Constant Progression:  Worsening Chronicity:  Recurrent Ineffective treatments:  Inhaler and oxygen Associated symptoms: chest pain, cough, sputum production and wheezing   Associated symptoms: no abdominal pain, no diaphoresis, no fever and no syncope     Past Medical History  Diagnosis Date  . COPD (chronic obstructive pulmonary disease)   . Chronic bronchitis     "get it q yr" (07/17/2013)  . Hypertension   . DVT (deep venous thrombosis) 1990's    "?LLE"  . On home oxygen therapy     "2L 24/7 since Monday" (07/17/2013)  . GERD (gastroesophageal reflux disease)     "related to RX I take" (07/17/2013)  . Daily headache   . Migraine     "all my life; no really bad migraines in 8-9 since work stress is gone" (07/17/2013)  . Pneumonia     "several times"   Past Surgical History  Procedure Laterality Date  . Tonsillectomy  1949  . Inguinal hernia repair Left ~ 1990   Family History  Problem Relation Age of Onset  . Breast cancer Mother    History  Substance Use Topics  . Smoking status: Former Smoker -- 1.00 packs/day for 10 years    Types: Cigarettes    Quit date: 02/06/1998  . Smokeless tobacco: Never Used  . Alcohol Use: No    Review of Systems  Constitutional: Negative for fever and diaphoresis.  Respiratory: Positive for cough, sputum production, shortness of breath and wheezing.   Cardiovascular: Positive for chest pain. Negative for syncope.  Gastrointestinal: Negative for abdominal pain.  All other systems reviewed and are  negative.     Allergies  Codeine  Home Medications   Prior to Admission medications   Medication Sig Start Date End Date Taking? Authorizing Provider  albuterol (ACCUNEB) 0.63 MG/3ML nebulizer solution Take 1 ampule by nebulization every 6 (six) hours as needed for wheezing.    Historical Provider, MD  albuterol (PROVENTIL HFA;VENTOLIN HFA) 108 (90 BASE) MCG/ACT inhaler Inhale 2 puffs into the lungs every 6 (six) hours as needed for wheezing or shortness of breath.     Historical Provider, MD  arformoterol (BROVANA) 15 MCG/2ML NEBU Take 2 mLs (15 mcg total) by nebulization 2 (two) times daily. 11/12/13   Storm FriskPatrick E Wright, MD  Aspirin-Caffeine (BAYER BACK & BODY PAIN EX ST) 500-32.5 MG TABS Take 2 tablets by mouth 2 (two) times daily as needed.     Historical Provider, MD  Cholecalciferol (VITAMIN D-3) 5000 UNITS TABS Take 5,000 Units by mouth daily.     Historical Provider, MD  guaiFENesin (MUCINEX) 600 MG 12 hr tablet Take 600 mg by mouth 2 (two) times daily.    Historical Provider, MD  hydrochlorothiazide (HYDRODIURIL) 25 MG tablet Take 25 mg by mouth daily.    Historical Provider, MD  ibuprofen (ADVIL,MOTRIN) 200 MG tablet Take 800 mg by mouth every 6 (six) hours as needed for mild pain or moderate pain.    Historical Provider, MD  pantoprazole (PROTONIX) 40 MG tablet Take 1 tablet (40 mg total) by mouth  2 (two) times daily. 11/01/13   Vassie Loll, MD  predniSONE (DELTASONE) 10 MG tablet Take 4 for three days 3 for three days 2 for three days 1 for three days and stop 11/17/13   Storm Frisk, MD  PROAIR HFA 108 [phone] BASE) MCG/ACT inhaler INHALE 1 TO 2 PUFFS BY MOUTH EVERY 4-6 HOURS AS NEEDED 11/24/13   Storm Frisk, MD  sodium chloride (OCEAN) 0.65 % SOLN nasal spray Place 1 spray into the nose as needed for congestion (Dry nasal passages).    Historical Provider, MD  SPIRIVA HANDIHALER 18 MCG inhalation capsule PLACE 1 CAPSULE (18 MCG TOTAL) INTO INHALER AND INHALE DAILY. 11/12/13    Storm Frisk, MD  tiotropium (SPIRIVA) 18 MCG inhalation capsule Place 18 mcg into inhaler and inhale daily.    Historical Provider, MD  vitamin C (ASCORBIC ACID) 500 MG tablet Take 500 mg by mouth daily.    Historical Provider, MD  VITAMIN E PO Take 1 tablet by mouth daily.    Historical Provider, MD   BP 175/100 mmHg  Pulse 92  Resp 15  SpO2 95% Physical Exam  Constitutional: He is oriented to person, place, and time. He appears well-developed and well-nourished.  HENT:  Head: Normocephalic and atraumatic.  Eyes: Pupils are equal, round, and reactive to light.  Cardiovascular: Normal rate and regular rhythm.   Pulmonary/Chest: No tachypnea. No respiratory distress. He has decreased breath sounds. He has wheezes.  Abdominal: Soft. He exhibits no distension. There is no tenderness.  Musculoskeletal: Normal range of motion. He exhibits no edema or tenderness.  Neurological: He is alert and oriented to person, place, and time.  Skin: Skin is warm and dry.  Nursing note and vitals reviewed.   ED Course  Procedures (including critical care time) Labs Review Labs Reviewed  CBC WITH DIFFERENTIAL  BASIC METABOLIC PANEL  D-DIMER, QUANTITATIVE    Imaging Review No results found.   EKG Interpretation   Date/Time:  Monday January 19 2014 10:57:43 EST Ventricular Rate:  94 PR Interval:  164 QRS Duration: 123 QT Interval:  370 QTC Calculation: 463 R Axis:   55 Text Interpretation:  Age not entered, assumed to be  72 years old for  purpose of ECG interpretation Sinus rhythm Ventricular premature complex  Right atrial enlargement IVCD, consider atypical RBBB since last tracing  no significant change Confirmed by BELFI  MD, MELANIE ([id number]) on  01/19/2014 11:04:39 AM      MDM   Final diagnoses:  Cough    73 yo M w/ 3 days of worsening productive cough, dyspnea and CP. Similar to previous exacerbations but came in 'before it gets bad' as he has had prolonged hospital  stays before. On exam patient appears well in no distress. On 2 L/m satting above 95% titrated down to 1 L still satting well. As prolonged ventilatory phase wheezing diminished breath sounds throughout. Likely COPD exacerbation, will treat with DuoNeb's steroids and get chest x-ray to make sure is not consolidative pneumonia.Marland Kitchen low Risk for ACS. With the chest pain component we will also check d-dimer to evaluate for PE.  Labs and imaging negative. Spoke with medicine team about admission and they requested the patient be ambulated for vital signs. He was ambulated and his oxygen saturations dropped to 72% with heart rate in the 120s and respiratory rate near 30. He was put back into bed and put back on oxygen. Medicine team at bedside for evaluation and will admit for COPD  exacerbation. Another round of continuous nebulizers were given prior to leaving the ED.  Marily MemosJason Odus Clasby, MD 01/19/14 1629  Rolan BuccoMelanie Belfi, MD 01/20/14 [phone]

## 2014-01-19 NOTE — H&P (Signed)
[facility] History and Physical  Dell Pierce Crane. ZOX:[id number] DOB: 1940/05/14 DOA: 01/19/2014  Referring physician: ED PCP: [name], NP  Specialists: none  Chief Complaint: SOB  HPI:  73 y/o ? Mod-severe COPD +Emphysema, recent admission to the hospital 11/2013 with lingular pneumonia-2 L of oxygen, hypertension, prior history of DVT, reflux, daily headaches presented to [facility] 12/14 with shortness of breath Actually he presented to his primary office 01/15/14-he was given a Medrol Dosepak as well as doxycycline and obtained the medications and started taking them as directed. He started feeling poorly about 2 days ago, with fever, increasing shortness of breath, and some increasing dyspnea on exertion which is out of his baseline. He usually uses oxygen at home 2 L but had to him this up to 3 L per minute. He has a pulse ox at home and saw that his sats were dropping into the 80s.  He came to the emergency room with worsening shortness of breath. On ambulation in the emergency room he dropped his sats to the 70s while ambulating He hasn't noted any lower extremity edema but has noted pain and discomfort with ambulation and with exertion that is normal and customary for him He states also he's been having some chest pain-it does not seem to be angina-like and resolves after he ambulates. He has no current fever No rash No vomiting No diarrhea No falls No weak S No hematemesis No bloody vomit No rash No dysuria  Emergency room workup showed leukocytosis of 15.1 d-dimer 0.27,   no point-of-care troponin obtained, EKG showed sinus to sinus tach PR interval 0.20 QRS axis 45 wondering baseline, no acute ST-T wave changes CXr showed Emphysema and ? New # of T spine   Review of Systems: Review of systems completed and negative except above  Past Medical History  Diagnosis Date  . COPD (chronic obstructive pulmonary disease)   . Chronic bronchitis      "get it q yr" (07/17/2013)  . Hypertension   . DVT (deep venous thrombosis) 1990's    "?LLE"  . On home oxygen therapy     "2L 24/7 since Monday" (07/17/2013)  . GERD (gastroesophageal reflux disease)     "related to RX I take" (07/17/2013)  . Daily headache   . Migraine     "all my life; no really bad migraines in 8-9 since work stress is gone" (07/17/2013)  . Pneumonia     "several times"   Past Surgical History  Procedure Laterality Date  . Tonsillectomy  1949  . Inguinal hernia repair Left ~ 1990   Social History:  History   Social History Narrative   Used to work at [employer]   Used to also work Holiday representative   Not married at [employer] alone       Allergies  Allergen Reactions  . Brovana [Arformoterol]     Ineffective--exacerbated coughing/congestion  . Codeine Other (See Comments)    Hallucinations   . Other Swelling    Bicillin injection in 1961--swelling in lower extremities and joints, and whelps up arm.  Has taken penicillin since w/o reaction.    Family History  Problem Relation Age of Onset  . Breast cancer Mother   . Alzheimer's disease Father     Prior to Admission medications   Medication Sig Start Date End Date Taking? Authorizing Provider  ADVAIR DISKUS 250-50 MCG/DOSE AEPB Inhale 1 puff into the lungs 2 (two) times daily.  01/03/14  Yes Historical Provider, MD  albuterol (PROVENTIL HFA;VENTOLIN HFA) 108 (90 BASE) MCG/ACT inhaler Inhale 2 puffs into the lungs every 6 (six) hours as needed for wheezing or shortness of breath.    Yes Historical Provider, MD  Aspirin-Caffeine (BAYER BACK & BODY PAIN EX ST) 500-32.5 MG TABS Take 2 tablets by mouth 2 (two) times daily as needed.    Yes Historical Provider, MD  Cholecalciferol (VITAMIN D-3) 5000 UNITS TABS Take 5,000 Units by mouth daily as needed (Dietary supplementation).    Yes Historical Provider, MD  doxycycline (VIBRAMYCIN) 100 MG capsule Take 100 mg by mouth 2 (two)  times daily.  01/15/14  Yes Historical Provider, MD  hydrochlorothiazide (HYDRODIURIL) 25 MG tablet Take 25 mg by mouth daily.   Yes Historical Provider, MD  ibuprofen (ADVIL,MOTRIN) 200 MG tablet Take 400-800 mg by mouth every 6 (six) hours as needed for mild pain or moderate pain.    Yes Historical Provider, MD  ipratropium (ATROVENT) 0.02 % nebulizer solution Take 0.5 mg by nebulization every 6 (six) hours as needed for wheezing or shortness of breath.  12/23/13  Yes Historical Provider, MD  pantoprazole (PROTONIX) 40 MG tablet Take 1 tablet (40 mg total) by mouth 2 (two) times daily. 11/01/13  Yes Vassie Lollarlos Madera, MD  predniSONE (STERAPRED UNI-PAK) 10 MG tablet Take 10 tablets by mouth 2 (two) times daily.  01/15/14  Yes Historical Provider, MD  sodium chloride (OCEAN) 0.65 % SOLN nasal spray Place 1 spray into the nose as needed for congestion (Dry nasal passages).   Yes Historical Provider, MD  SPIRIVA HANDIHALER 18 MCG inhalation capsule PLACE 1 CAPSULE (18 MCG TOTAL) INTO INHALER AND INHALE DAILY. 11/12/13  Yes Storm FriskPatrick E Wright, MD  tiotropium (SPIRIVA) 18 MCG inhalation capsule Place 18 mcg into inhaler and inhale daily.   Yes Historical Provider, MD  vitamin C (ASCORBIC ACID) 500 MG tablet Take 500 mg by mouth daily.   Yes Historical Provider, MD  VITAMIN E PO Take 1 tablet by mouth daily.   Yes Historical Provider, MD  arformoterol (BROVANA) 15 MCG/2ML NEBU Take 2 mLs (15 mcg total) by nebulization 2 (two) times daily. Patient not taking: Reported on 01/19/2014 11/12/13   Storm FriskPatrick E Wright, MD  predniSONE (DELTASONE) 10 MG tablet Take 4 for three days 3 for three days 2 for three days 1 for three days and stop Patient not taking: Reported on 01/19/2014 11/17/13   Storm FriskPatrick E Wright, MD  PROAIR HFA 108 (90 BASE) MCG/ACT inhaler INHALE 1 TO 2 PUFFS BY MOUTH EVERY 4-6 HOURS AS NEEDED Patient not taking: Reported on 01/19/2014 11/24/13   Storm FriskPatrick E Wright, MD   Physical Exam: Filed Vitals:    01/19/14 1300 01/19/14 1359 01/19/14 1441 01/19/14 1454  BP: 141/76 141/76    Pulse: 99 91 127   Resp: 23 20 28    SpO2: 92% 94% 72% 95%     General:  Alert pleasant oriented, visibly short of breath  Eyes: EOMI NCAT  ENT: Mallampati 2, no JVD, moderate dentition  Neck: No thyromegaly  Cardiovascular: S1-S2 slightly tachycardic. No murmur  Respiratory: Wheezing throughout all lung fields no fremitus no resonance accessory muscles in use  Abdomen: Soft nontender nondistended no rebound  Skin: No lower extremity edema  Musculoskeletal: Range of motion intact  Psychiatric: Euthymic  Neurologic: 5/5 bilaterally, no drift, smile symmetric Thomas extraocular movements grossly intact-other cranial nerves intact grossly to exam  Labs on Admission:  Basic Metabolic Panel:  Recent Labs Lab 01/19/14  1100  NA 139  K 3.8  CL 97  CO2 27  GLUCOSE 116*  BUN 23  CREATININE 0.80  CALCIUM 9.6   Liver Function Tests: No results for input(s): AST, ALT, ALKPHOS, BILITOT, PROT, ALBUMIN in the last 168 hours. No results for input(s): LIPASE, AMYLASE in the last 168 hours. No results for input(s): AMMONIA in the last 168 hours. CBC:  Recent Labs Lab 01/19/14 1100  WBC 15.1*  NEUTROABS 13.9*  HGB 13.4  HCT 41.4  MCV 95.6  PLT 288   Cardiac Enzymes: No results for input(s): CKTOTAL, CKMB, CKMBINDEX, TROPONINI in the last 168 hours.  BNP (last 3 results)  Recent Labs  07/17/13 1110 08/06/13 1220  PROBNP 296.9* 103.4   CBG: No results for input(s): GLUCAP in the last 168 hours.  Radiological Exams on Admission: Dg Chest 2 View  01/19/2014   CLINICAL DATA:  Shortness of breath.  Cough.  EXAM: CHEST  2 VIEW  COMPARISON:  11/12/2013.  03/12/2012.  FINDINGS: Mediastinum and hilar structures are normal. Diffuse interstitial prominence noted consistent with interstitial fibrosis. No pleural effusion or pneumothorax. No focal infiltrate. Heart size normal. Diffuse  degenerative changes thoracic spine. New mid thoracic spine vertebral body compression fracture.  IMPRESSION: 1. Chronic interstitial lung disease. No acute cardiopulmonary disease.  2. Mild mid thoracic spine compression fracture, new from prior exam. Compression fracture most likely secondary to osteopenia.   Electronically Signed   By: Maisie Fushomas  Register   On: 01/19/2014 12:16    EKG: Independently reviewed. Reviewed as above  Assessment/Plan Principal Problem:   Acute hypoxic respiratory failure-secondary to COPD exacerbation failing to respond to oral medication therapy-we will start him on IV steroids, Solu-Medrol 60 twice a day given he has not responded to by mouth prednisone. In addition we will nebulizer every 4 hours with albuterol. At this stage I do not like he needs to be given either magnesium or any other agent. See below Active Problems:   Essential hypertension   Chest pain-EKG is not concerning for any ischemia and his history is more consistent with dyspnea however we will cycle troponins regardless.   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 This is his second or third admission in the past 6 months for similar concerns and I'm not sure what other options he has other than some medical therapy. It may be reasonable to get a goals of care with regards to his level of function. He might also need repeat PFTs in the next couple of months that his pulmonologist office.  He was unable to tolerate Nestor LewandowskyBrovana UR Brown   Thoracic fracture noted on x-ray--this is noted as an osteopenic fracture-etiology probably secondary to chronic steroid use. As he is in no pain right now, would hold off on further workup or kyphoplasty consideration.   GERD-continue pantoprazole   Tobacco abuse, prior history of   DVT (deep venous thrombosis) LLE 90's-d-dimer is  negative   Full CODE STATUS  No family present at bedside Time spent: 8375  Mahala MenghiniSAMTANI, Bay[facility] Scott [facility] White Institute For Rehabilitation - LakewayJAI-GURMUKH [facility] Pager [phone]  If 7PM-7AM, please contact night-coverage [email or web address] Password [email or web address] 01/19/2014, 2:57 PM

## 2014-01-19 NOTE — ED Notes (Signed)
EKG delayed due to equipment malfunction 

## 2014-01-19 NOTE — ED Notes (Signed)
Returned from xray

## 2014-01-19 NOTE — ED Notes (Signed)
To ED for eval of increased SOB over the past few days. Pt is under the care of PMD and given steriods and abx. Talking in full complete sentences. 2L [place]. Skin w/d, denies cp

## 2014-01-20 ENCOUNTER — Inpatient Hospital Stay (HOSPITAL_COMMUNITY): Payer: Medicare Other

## 2014-01-20 LAB — CBC
HEMATOCRIT: 41.5 % (ref 39.0–52.0)
HEMOGLOBIN: 13.2 g/dL (ref 13.0–17.0)
MCH: 30 pg (ref 26.0–34.0)
MCHC: 31.8 g/dL (ref 30.0–36.0)
MCV: 94.3 fL (ref 78.0–100.0)
Platelets: 287 10*3/uL (ref 150–400)
RBC: 4.4 MIL/uL (ref 4.22–5.81)
RDW: 14.9 % (ref 11.5–15.5)
WBC: 19.6 10*3/uL — ABNORMAL HIGH (ref 4.0–10.5)

## 2014-01-20 LAB — COMPREHENSIVE METABOLIC PANEL
ALT: 20 U/L (ref 0–53)
AST: 29 U/L (ref 0–37)
Albumin: 3.8 g/dL (ref 3.5–5.2)
Alkaline Phosphatase: 61 U/L (ref 39–117)
Anion gap: 17 — ABNORMAL HIGH (ref 5–15)
BILIRUBIN TOTAL: 0.4 mg/dL (ref 0.3–1.2)
BUN: 23 mg/dL (ref 6–23)
CHLORIDE: 94 meq/L — AB (ref 96–112)
CO2: 27 meq/L (ref 19–32)
Calcium: 9.8 mg/dL (ref 8.4–10.5)
Creatinine, Ser: 0.76 mg/dL (ref 0.50–1.35)
GFR, EST NON AFRICAN AMERICAN: 89 mL/min — AB (ref >90)
Glucose, Bld: 119 mg/dL — ABNORMAL HIGH (ref 70–99)
Potassium: 3.5 mEq/L — ABNORMAL LOW (ref 3.7–5.3)
SODIUM: 138 meq/L (ref 137–147)
Total Protein: 6.7 g/dL (ref 6.0–8.3)

## 2014-01-20 LAB — PROTIME-INR
INR: 0.97 (ref 0.00–1.49)
Prothrombin Time: 13 seconds (ref 11.6–15.2)

## 2014-01-20 LAB — TROPONIN I: Troponin I: <0.3 ng/mL (ref <0.30)

## 2014-01-20 MED ORDER — METHYLPREDNISOLONE SODIUM SUCC 40 MG IJ SOLR
40.0000 mg | Freq: Two times a day (BID) | INTRAMUSCULAR | Status: DC
  Administered 2014-01-20 – 2014-01-21 (×3): 40 mg via INTRAVENOUS
  Filled 2014-01-20 (×4): qty 1

## 2014-01-20 MED ORDER — DOXYCYCLINE HYCLATE 100 MG PO TABS
100.0000 mg | ORAL_TABLET | Freq: Two times a day (BID) | ORAL | Status: DC
  Administered 2014-01-20 – 2014-01-21 (×3): 100 mg via ORAL
  Filled 2014-01-20 (×5): qty 1

## 2014-01-20 MED ORDER — ALBUTEROL SULFATE (2.5 MG/3ML) 0.083% IN NEBU
3.0000 mL | INHALATION_SOLUTION | RESPIRATORY_TRACT | Status: DC | PRN
  Administered 2014-01-20: 3 mL via RESPIRATORY_TRACT
  Filled 2014-01-20 (×2): qty 3

## 2014-01-20 MED ORDER — ALUM & MAG HYDROXIDE-SIMETH 200-200-20 MG/5ML PO SUSP
15.0000 mL | Freq: Four times a day (QID) | ORAL | Status: DC | PRN
  Administered 2014-01-20 – 2014-01-21 (×3): 15 mL via ORAL
  Filled 2014-01-20 (×3): qty 30

## 2014-01-20 MED ORDER — GUAIFENESIN ER 600 MG PO TB12
600.0000 mg | ORAL_TABLET | Freq: Two times a day (BID) | ORAL | Status: DC
  Administered 2014-01-20 – 2014-01-21 (×4): 600 mg via ORAL
  Filled 2014-01-20 (×5): qty 1

## 2014-01-20 MED ORDER — GUAIFENESIN-DM 100-10 MG/5ML PO SYRP
5.0000 mL | ORAL_SOLUTION | ORAL | Status: DC | PRN
  Administered 2014-01-20 (×4): 5 mL via ORAL
  Filled 2014-01-20 (×5): qty 5

## 2014-01-20 MED ORDER — IBUPROFEN 600 MG PO TABS
600.0000 mg | ORAL_TABLET | Freq: Three times a day (TID) | ORAL | Status: DC | PRN
  Administered 2014-01-20 – 2014-01-21 (×3): 600 mg via ORAL
  Filled 2014-01-20 (×5): qty 1

## 2014-01-20 MED ORDER — IPRATROPIUM-ALBUTEROL 0.5-2.5 (3) MG/3ML IN SOLN
3.0000 mL | RESPIRATORY_TRACT | Status: DC
  Administered 2014-01-20 – 2014-01-21 (×8): 3 mL via RESPIRATORY_TRACT
  Filled 2014-01-20 (×7): qty 3

## 2014-01-20 MED ORDER — BENZONATATE 100 MG PO CAPS
200.0000 mg | ORAL_CAPSULE | Freq: Three times a day (TID) | ORAL | Status: DC | PRN
  Administered 2014-01-20 – 2014-01-21 (×2): 200 mg via ORAL
  Filled 2014-01-20 (×4): qty 2

## 2014-01-20 NOTE — Progress Notes (Signed)
Utilization review completed.  

## 2014-01-20 NOTE — Progress Notes (Signed)
[facility] PROGRESS NOTE  [name] [name]:[id number] DOB: 22-Feb-1940 DOA: 01/19/2014 PCP: Egbert GaribaldiMillsaps, KIMBERLY M, NP  Assessment/Plan: 1. Copd exacerbation: Improving. On steroids, bronchodilators and antibiotics. Keep sats greater than 92% . Physical therapy evaluation.   Hypertension: controlled.   Hypokalemia: replete as needed  Leukocytosis: Probably from the steroids. Monitor.       Code Status: full code.  Family Communication: none a tbedside Disposition Plan: pending.    Consultants:  none  Procedures:  none  Antibiotics:  Doxycycline.  HPI/Subjective: Breathing improved.   Objective: Filed Vitals:   01/20/14 1300  BP: 133/69  Pulse: 101  Temp: 98.5 F (36.9 C)  Resp: 24    Intake/Output Summary (Last 24 hours) at 01/20/14 1808 Last data filed at 01/20/14 1700  Gross per 24 hour  Intake    840 ml  Output    725 ml  Net    115 ml   Filed Weights   01/19/14 1645  Weight: 78.019 kg (172 lb)    Exam:   General:  Alert afebrile comfortable  Cardiovascular: s1s2  Respiratory: fair entry , scattered wheezing heard.   Abdomen: soft non tender non distended bowel sounds heard  Musculoskeletal: no pedal edema.   Data Reviewed: Basic Metabolic Panel:  Recent Labs Lab 01/19/14 1100 01/20/14 0321  NA 139 138  K 3.8 3.5*  CL 97 94*  CO2 27 27  GLUCOSE 116* 119*  BUN 23 23  CREATININE 0.80 0.76  CALCIUM 9.6 9.8   Liver Function Tests:  Recent Labs Lab 01/20/14 0321  AST 29  ALT 20  ALKPHOS 61  BILITOT 0.4  PROT 6.7  ALBUMIN 3.8   No results for input(s): LIPASE, AMYLASE in the last 168 hours. No results for input(s): AMMONIA in the last 168 hours. CBC:  Recent Labs Lab 01/19/14 1100 01/20/14 0321  WBC 15.1* 19.6*  NEUTROABS 13.9*  --   HGB 13.4 13.2  HCT 41.4 41.5  MCV 95.6 94.3  PLT 288 287   Cardiac Enzymes:  Recent Labs Lab 01/19/14 1551 01/19/14 2141 01/20/14 0321  TROPONINI <0.30  <0.30 <0.30   BNP (last 3 results)  Recent Labs  07/17/13 1110 08/06/13 1220  PROBNP 296.9* 103.4   CBG: No results for input(s): GLUCAP in the last 168 hours.  No results found for this or any previous visit (from the past 240 hour(s)).   Studies: Dg Chest 2 View  01/19/2014   CLINICAL DATA:  Shortness of breath.  Cough.  EXAM: CHEST  2 VIEW  COMPARISON:  11/12/2013.  03/12/2012.  FINDINGS: Mediastinum and hilar structures are normal. Diffuse interstitial prominence noted consistent with interstitial fibrosis. No pleural effusion or pneumothorax. No focal infiltrate. Heart size normal. Diffuse degenerative changes thoracic spine. New mid thoracic spine vertebral body compression fracture.  IMPRESSION: 1. Chronic interstitial lung disease. No acute cardiopulmonary disease.  2. Mild mid thoracic spine compression fracture, new from prior exam. Compression fracture most likely secondary to osteopenia.   Electronically Signed   By: Maisie Fushomas  Register   On: 01/19/2014 12:16   Dg Abd 2 Views  01/20/2014   CLINICAL DATA:  Abdominal pain, distention for 2 days.  EXAM: ABDOMEN - 2 VIEW  COMPARISON:  None.  FINDINGS: The bowel gas pattern is normal. There is no evidence of free air. No radio-opaque calculi or other significant radiographic abnormality is seen.  IMPRESSION: No acute findings.   Electronically Signed   By: Charlett NoseKevin  Dover M.D.   On:  01/20/2014 16:21    Scheduled Meds: . doxycycline  100 mg Oral Q12H  . guaiFENesin  600 mg Oral BID  . heparin  5,000 Units Subcutaneous 3 times per day  . hydrochlorothiazide  25 mg Oral Daily  . ipratropium-albuterol  3 mL Nebulization Q4H  . methylPREDNISolone (SOLU-MEDROL) injection  40 mg Intravenous Q12H  . mometasone-formoterol  2 puff Inhalation BID  . pantoprazole  40 mg Oral BID  . sodium chloride  3 mL Intravenous Q12H  . vitamin C  500 mg Oral Daily   Continuous Infusions: . 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 If 7PM-7AM, please contact night-coverage at [email or web address], password [email or web address] 01/20/2014, 6:08 PM  LOS: 1 day

## 2014-01-21 ENCOUNTER — Encounter (HOSPITAL_COMMUNITY): Payer: Self-pay | Admitting: General Practice

## 2014-01-21 DIAGNOSIS — J9601 Acute respiratory failure with hypoxia: Principal | ICD-10-CM

## 2014-01-21 MED ORDER — PREDNISONE 10 MG PO TABS: ORAL_TABLET | ORAL | Status: DC

## 2014-01-21 NOTE — Care Management Note (Signed)
    Page 1 of 1   01/21/2014     4:19:26 PM CARE MANAGEMENT NOTE 01/21/2014  Patient:  [name] [name],[name] [name]   Account Number:  [id number]  Date Initiated:  01/21/2014  Documentation initiated by:  Donn PieriniWEBSTER,Gurbani Figge  Subjective/Objective Assessment:   Pt admitted with COPD     Action/Plan:   PTA pt lived at home   Anticipated DC Date:  01/21/2014   Anticipated DC Plan:  HOME/SELF CARE         Choice offered to / List presented to:             Status of service:  Completed, signed off Medicare Important Message given?  NA - LOS <3 / Initial given by admissions (If response is "NO", the following Medicare IM given date fields will be blank) Date Medicare IM given:   Medicare IM given by:   Date Additional Medicare IM given:   Additional Medicare IM given by:    Discharge Disposition:  HOME/SELF CARE  Per UR Regulation:  Reviewed for med. necessity/level of care/duration of stay  If discussed at Long Length of Stay Meetings, dates discussed:    Comments:

## 2014-01-21 NOTE — Evaluation (Signed)
Physical Therapy Evaluation Patient Details Name: [name] Morningddie Vigilante Jr. MRN: [id number] DOB: 1940/07/27 Today's Date: 01/21/2014   History of Present Illness  73 y/o Mod-severe COPD +Emphysema, recent admission to the hospital 11/2013 with lingular pneumonia-2 L of oxygen, hypertension, prior history of DVT, reflux, daily headaches [facility] with shortness of breath  Clinical Impression  Pt admitted with above diagnosis. Pt currently with functional limitations due to the deficits listed below (see PT Problem List). Pt will benefit from skilled PT to increase their independence and safety with mobility to allow discharge to the venue listed below.  Initiated education on energy conservation.     Follow Up Recommendations No PT follow up    Equipment Recommendations  None recommended by PT    Recommendations for Other Services       Precautions / Restrictions Precautions Precaution Comments: o2 dependent Restrictions Weight Bearing Restrictions: No      Mobility  Bed Mobility               General bed mobility comments: up on EOB upon arrival  Transfers Overall transfer level: Modified independent                  Ambulation/Gait Ambulation/Gait assistance: Supervision Ambulation Distance (Feet): 30 Feet (x 2) Assistive device: None Gait Pattern/deviations: WFL(Within Functional Limits)     General Gait Details: Amb 30 then checked o2 and it was 89%, then amb another 30 and it decreased to 85% on 2 L/min. Increased back to 91% within 90 seconds with pursed lip breathing.  Stairs            Wheelchair Mobility    Modified Rankin (Stroke Patients Only)       Balance Overall balance assessment: No apparent balance deficits (not formally assessed)                                           Pertinent Vitals/Pain Pain Assessment: 0-10 Pain Score: 5  Pain Descriptors / Indicators: Other (Comment)  (bloated/gas) Pain Intervention(s): Repositioned    Home Living Family/patient expects to be discharged to:: Private residence Living Arrangements: Alone   Type of Home: House Home Access: Stairs to enter Entrance Stairs-Rails: Left Entrance Stairs-Number of Steps: 3 Home Layout: One level Home Equipment: Shower seat - built in;Other (comment) (home o2)      Prior Function Level of Independence: Independent         Comments: Still driving, 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     Hand Dominance   Dominant Hand: Right    Extremity/Trunk Assessment   Upper Extremity Assessment: Overall WFL for tasks assessed           Lower Extremity Assessment: Overall WFL for tasks assessed      Cervical / Trunk Assessment: Normal  Communication   Communication: No difficulties  Cognition Arousal/Alertness: Awake/alert Behavior During Therapy: WFL for tasks assessed/performed Overall Cognitive Status: Within Functional Limits for tasks assessed                      General Comments      Exercises        Assessment/Plan    PT Assessment Patient needs continued PT services  PT Diagnosis Difficulty walking   PT Problem List Cardiopulmonary status limiting activity;Decreased activity tolerance  PT Treatment Interventions Gait  training;Functional mobility training;Therapeutic activities;Therapeutic exercise   PT Goals (Current goals can be found in the Care Plan section) Acute Rehab PT Goals Patient Stated Goal: go home  PT Goal Formulation: With patient Time For Goal Achievement: 01/28/14 Potential to Achieve Goals: Good    Frequency Min 3X/week   Barriers to discharge        Co-evaluation               End of Session Equipment Utilized During Treatment: Gait belt Activity Tolerance: Patient limited by fatigue Patient left: in chair Nurse Communication: Other (comment);Mobility status (o2 sats)         Time: [id number] PT Time  Calculation (min) (ACUTE ONLY): 20 min   Charges:   PT Evaluation $Initial PT Evaluation Tier I: 1 Procedure PT Treatments $Gait Training: 8-22 mins   PT G Codes:          Mikaia Janvier LUBECK 01/21/2014, 12:03 PM

## 2014-01-21 NOTE — Progress Notes (Signed)
SATURATION QUALIFICATIONS: (This note is used to comply with regulatory documentation for home oxygen)    Patient Saturations on 2 Liters of oxygen while Ambulating = 85-89%

## 2014-01-21 NOTE — Discharge Summary (Signed)
Discharge Summary  [name] [name]. ZOX:[id number] DOB: 11/27/1940  PCP: [name], NP  Admit date: 01/19/2014 Discharge date: 01/21/2014  Time spent: 25 minutes  Recommendations for Outpatient Follow-up:  1. Patient will follow-up with his primary care physician as scheduled on Monday 12/21 2. New medication: Steroid taper  Discharge Diagnoses:  Active Hospital Problems   Diagnosis Date Noted  . Acute hypoxic respiratory failure 01/19/2014  . Tobacco abuse 01/19/2014  . DVT (deep venous thrombosis) LLE 90's   . On home oxygen therapy   . GERD 12/25/2008  . 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 Essential hypertension 11/09/2008    Resolved Hospital Problems   Diagnosis Date Noted Date Resolved  . Acute respiratory failure 10/29/2013 11/12/2013  . Nausea with vomiting 12/21/2012 12/21/2012    Discharge Condition: Improved, being discharged home  Diet recommendation: Heart healthy  Filed Weights   01/19/14 1645 01/21/14 0355  Weight: 78.019 kg (172 lb) 77.384 kg (170 lb 9.6 oz)    History of present illness:  73 year old with history of COPD gold on 2-3 liters chronic oxygen by nasal cannula and hypertension who presented to the emergency room on 12/14 with shortness of breath and cough. He been having symptoms for several days and had tried doxycycline and a Medrol Dosepak but with little relief.  Hospital Course:  Principal Problem:   Acute hypoxic respiratory failure: Patient started on scheduled plus when necessary nebulizers, IV steroids, additional oxygen on top of his baseline and continue doxycycline. By 12/16, patient was felt to be back to his baseline. With ambulation he did have some decrease in his oxygen saturations to 85% requiring 3 L nasal cannula. His exam noted some very mild wheezing, but he looked to be more comfortable. He'll be discharged on a prolonged steroid taper and has plans to  follow up with his PCP next week and his pulmonologist in the next month. 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: As above   GERD: Stable continue PPI   Tobacco abuse: Patient has since quit smoking   DVT (deep venous thrombosis) LLE 90's: D-dimer negative on admission   On home oxygen therapy   Procedures:  None  Consultations:  None  Discharge Exam: BP 132/76 mmHg  Pulse 106  Temp(Src) 98.2 F (36.8 C) (Oral)  Resp 17  Ht 5\' 8"  (1.727 m)  Wt 77.384 kg (170 lb 9.6 oz)  BMI 25.95 kg/m2  SpO2 96%  General: Alert and oriented 3, no acute distress Cardiovascular: Regular rate and rhythm, S1-S2 Respiratory: Mild end expiratory wheeze bilateral  Discharge Instructions You were cared for by a hospitalist during your hospital stay. If you have any questions about your discharge medications or the care you received while you were in the hospital after you are discharged, you can call the unit and asked to speak with the hospitalist on call if the hospitalist that took care of you is not available. Once you are discharged, your primary care physician will handle any further medical issues. Please note that NO REFILLS for any discharge medications will be authorized once you are discharged, as it is imperative that you return to your primary care physician (or establish a relationship with a primary care physician if you do not have one) for your aftercare needs so that  they can reassess your need for medications and monitor your lab values.  Discharge Instructions    Diet - low sodium heart healthy    Complete by:  As directed      Increase activity slowly    Complete by:  As directed             Medication List    STOP taking these medications        ADVAIR DISKUS 250-50 MCG/DOSE Aepb  Generic drug:   Fluticasone-Salmeterol      TAKE these medications        albuterol 108 (90 BASE) MCG/ACT inhaler  Commonly known as:  PROVENTIL HFA;VENTOLIN HFA  Inhale 2 puffs into the lungs every 6 (six) hours as needed for wheezing or shortness of breath.     arformoterol 15 MCG/2ML Nebu  Commonly known as:  BROVANA  Take 2 mLs (15 mcg total) by nebulization 2 (two) times daily.     BAYER BACK & BODY PAIN EX ST 500-32.5 MG Tabs  Generic drug:  Aspirin-Caffeine  Take 2 tablets by mouth 2 (two) times daily as needed.     doxycycline 100 MG capsule  Commonly known as:  VIBRAMYCIN  Take 100 mg by mouth 2 (two) times daily.     hydrochlorothiazide 25 MG tablet  Commonly known as:  HYDRODIURIL  Take 25 mg by mouth daily.     ibuprofen 200 MG tablet  Commonly known as:  ADVIL,MOTRIN  Take 400-800 mg by mouth every 6 (six) hours as needed for mild pain or moderate pain.     ipratropium 0.02 % nebulizer solution  Commonly known as:  ATROVENT  Take 0.5 mg by nebulization every 6 (six) hours as needed for wheezing or shortness of breath.     pantoprazole 40 MG tablet  Commonly known as:  PROTONIX  Take 1 tablet (40 mg total) by mouth 2 (two) times daily.     predniSONE 10 MG tablet  Commonly known as:  DELTASONE  4 tab (40mg ) po bid x 3 days, then 30mg  po bid x 3 days, then 20mg  po bid x 3 days, then 10 mg po bid x 3 days, then 10mg  po daily x 3 days     sodium chloride 0.65 % Soln nasal spray  Commonly known as:  OCEAN  Place 1 spray into the nose as needed for congestion (Dry nasal passages).     SPIRIVA HANDIHALER 18 MCG inhalation capsule  Generic drug:  tiotropium  PLACE 1 CAPSULE (18 MCG TOTAL) INTO INHALER AND INHALE DAILY.     vitamin C 500 MG tablet  Commonly known as:  ASCORBIC ACID  Take 500 mg by mouth daily.     Vitamin D-3 5000 UNITS Tabs  Take 5,000 Units by mouth daily as needed (Dietary supplementation).     VITAMIN E PO  Take 1 tablet by mouth daily.        Allergies  Allergen Reactions  . Brovana [Arformoterol]     Ineffective--exacerbated coughing/congestion  . Codeine Other (See Comments)    Hallucinations   . Other Swelling    Bicillin injection in 1961--swelling in lower extremities and joints, and whelps up arm.  Has taken penicillin since w/o reaction.      The results of significant diagnostics from this hospitalization (including imaging, microbiology, ancillary and laboratory) are listed below for reference.    Significant Diagnostic Studies: Dg Chest 2 View  01/19/2014   CLINICAL DATA:  Shortness of breath.  Cough.  EXAM: CHEST  2 VIEW  COMPARISON:  11/12/2013.  03/12/2012.  FINDINGS: Mediastinum and hilar structures are normal. Diffuse interstitial prominence noted consistent with interstitial fibrosis. No pleural effusion or pneumothorax. No focal infiltrate. Heart size normal. Diffuse degenerative changes thoracic spine. New mid thoracic spine vertebral body compression fracture.  IMPRESSION: 1. Chronic interstitial lung disease. No acute cardiopulmonary disease.  2. Mild mid thoracic spine compression fracture, new from prior exam. Compression fracture most likely secondary to osteopenia.   Electronically Signed   By: Maisie Fushomas  Register   On: 01/19/2014 12:16   Dg Abd 2 Views  01/20/2014   CLINICAL DATA:  Abdominal pain, distention for 2 days.  EXAM: ABDOMEN - 2 VIEW  COMPARISON:  None.  FINDINGS: The bowel gas pattern is normal. There is no evidence of free air. No radio-opaque calculi or other significant radiographic abnormality is seen.  IMPRESSION: No acute findings.   Electronically Signed   By: Charlett NoseKevin  Dover M.D.   On: 01/20/2014 16:21    Microbiology: No results found for this or any previous visit (from the past 240 hour(s)).   Labs: Basic Metabolic Panel:  Recent Labs Lab 01/19/14 1100 01/20/14 0321  NA 139 138  K 3.8 3.5*  CL 97 94*  CO2 27 27  GLUCOSE 116* 119*  BUN 23 23  CREATININE 0.80 0.76   CALCIUM 9.6 9.8   Liver Function Tests:  Recent Labs Lab 01/20/14 0321  AST 29  ALT 20  ALKPHOS 61  BILITOT 0.4  PROT 6.7  ALBUMIN 3.8   No results for input(s): LIPASE, AMYLASE in the last 168 hours. No results for input(s): AMMONIA in the last 168 hours. CBC:  Recent Labs Lab 01/19/14 1100 01/20/14 0321  WBC 15.1* 19.6*  NEUTROABS 13.9*  --   HGB 13.4 13.2  HCT 41.4 41.5  MCV 95.6 94.3  PLT 288 287   Cardiac Enzymes:  Recent Labs Lab 01/19/14 1551 01/19/14 2141 01/20/14 0321  TROPONINI <0.30 <0.30 <0.30   BNP: BNP (last 3 results)  Recent Labs  07/17/13 1110 08/06/13 1220  PROBNP 296.9* 103.4   CBG: No results for input(s): GLUCAP in the last 168 hours.     Signed:  Hollice EspyKRISHNAN,Odell Fasching K  [facility] 01/21/2014, 3:49 PM

## 2014-01-26 DIAGNOSIS — J189 Pneumonia, unspecified organism: Secondary | ICD-10-CM | POA: Diagnosis not present

## 2014-01-26 DIAGNOSIS — J441 Chronic obstructive pulmonary disease with (acute) exacerbation: Secondary | ICD-10-CM | POA: Diagnosis not present

## 2014-04-13 ENCOUNTER — Ambulatory Visit (INDEPENDENT_AMBULATORY_CARE_PROVIDER_SITE_OTHER): Payer: Medicare Other | Admitting: Critical Care Medicine

## 2014-04-13 ENCOUNTER — Encounter: Payer: Self-pay | Admitting: Critical Care Medicine

## 2014-04-13 VITALS — BP 110/70 | HR 97 | Temp 97.1°F | Ht 67.5 in | Wt 173.0 lb

## 2014-04-13 DIAGNOSIS — J449 Chronic obstructive pulmonary disease, unspecified: Secondary | ICD-10-CM

## 2014-04-13 DIAGNOSIS — J9601 Acute respiratory failure with hypoxia: Secondary | ICD-10-CM

## 2014-04-13 MED ORDER — ALBUTEROL SULFATE HFA 108 (90 BASE) MCG/ACT IN AERS: 2.0000 | INHALATION_SPRAY | Freq: Four times a day (QID) | RESPIRATORY_TRACT | Status: AC | PRN

## 2014-04-13 NOTE — Progress Notes (Signed)
Subjective:    Patient ID: [name] [name]., male    DOB: 03/23/1940, 74 y.o.   MRN: [id number]  HPI   04/13/2014 Chief Complaint  Patient presents with  . Follow-up    Increased SOB and cough after starting Brovana.  Now on Dulera - SOB and cough are improving.  Cough is nonprod.  Pt now on dulera. Pt was on brovana but had adverse side effect.  Pt also on albuterol q4h, along with dulera.  Pt was adm 01/2014 and no PNA seen.   Not able to tolerate brovana.  Pt able only to cough up small amount of mucus.  No real chest pain.  Pt had edema in LE and started diuretic.  Pt referred to [facility] cards and set up for chemical stress test and echo.   Pt denies any significant sore throat, nasal congestion or excess secretions, fever, chills, sweats, unintended weight loss, pleurtic or exertional chest pain, orthopnea PND, or leg swelling Pt denies any increase in rescue therapy over baseline, denies waking up needing it or having any early am or nocturnal exacerbations of coughing/wheezing/or dyspnea. Pt also denies any obvious fluctuation in symptoms with  weather or environmental change or other alleviating or aggravating factors  Pt using oxygen 2L 24/7 and has POC [employer] system.  This patient continues to use and benefit from her oxygen therapy and has re qualified at this visit for continued oxygen therapy    Review of Systems Constitutional:   No  weight loss, night sweats,  Fevers, chills, fatigue, lassitude. HEENT:   No headaches,  Difficulty swallowing,  Tooth/dental problems,  Sore throat,                No sneezing, itching, ear ache, nasal congestion, post nasal drip,   CV:  No chest pain,  Orthopnea, PND, swelling in lower extremities, anasarca, dizziness, palpitations  GI  No heartburn, indigestion, abdominal pain, nausea, vomiting, diarrhea, change in bowel habits, loss of appetite  Resp: Notes  shortness of breath with exertion not at rest.  No excess mucus, no  productive cough,  Notes        non-productive cough,  No coughing up of blood.  No change in color of mucus.  No wheezing.  No chest wall deformity  Skin: no rash or lesions.  GU: no dysuria, change in color of urine, no urgency or frequency.  No flank pain.  MS:  No joint pain or swelling.  No decreased range of motion.  No back pain.  Psych:  No change in mood or affect. No depression or anxiety.  No memory loss.     Objective:   Physical Exam Filed Vitals:   04/13/14 0917  BP: 110/70  Pulse: 97  Temp: 97.1 F (36.2 C)  TempSrc: Oral  Height: 5' 7.5" (1.715 m)  Weight: 173 lb (78.472 kg)  SpO2: 96%    Gen: Pleasant, well-nourished, in no distress,  normal affect  ENT: No lesions,  mouth clear,  oropharynx clear, no postnasal drip  Neck: No JVD, no TMG, no carotid bruits  Lungs: No use of accessory muscles, no dullness to percussion, distant breath sounds  Cardiovascular: RRR, heart sounds normal, no murmur or gallops, no peripheral edema  Abdomen: soft and NT, no HSM,  BS normal  Musculoskeletal: No deformities, no cyanosis or clubbing  Neuro: alert, non focal  Skin: Warm, no lesions or rashes  No results found.        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 albuterol (ACCUNEB) 0.63 MG/3ML nebulizer solution Take 3 mLs by nebulization every 4 (four) hours as needed.  Marland Kitchen albuterol (PROVENTIL HFA;VENTOLIN HFA) 108 (90 BASE) MCG/ACT inhaler Inhale 2 puffs into the lungs every 6 (six) hours as needed  for wheezing or shortness of breath.  . ALPRAZolam (XANAX) 0.5 MG tablet Take 0.5 mg by mouth 3 (three) times daily as needed.  . Aspirin-Caffeine (BAYER BACK & BODY PAIN EX ST) 500-32.5 MG TABS Take 2 tablets by mouth 2 (two) times daily as needed.   . furosemide (LASIX) 40 MG tablet Take 40 mg by mouth daily.  . hydrochlorothiazide (HYDRODIURIL) 25 MG tablet Take 25 mg by mouth daily.  Marland Kitchen ibuprofen (ADVIL,MOTRIN) 200 MG tablet Take 300-600 mg by mouth every 6 (six) hours as needed.  . mometasone-formoterol (DULERA) 200-5 MCG/ACT AERO Inhale 2 puffs into the lungs 2 (two) times daily.  . pantoprazole (PROTONIX) 40 MG tablet Take 1 tablet (40 mg total) by mouth 2 (two) times daily.  . sodium chloride (OCEAN) 0.65 % SOLN nasal spray Place 1 spray into the nose as needed for congestion (Dry nasal passages).  . SPIRIVA HANDIHALER 18 MCG inhalation capsule PLACE 1 CAPSULE (18 MCG TOTAL) INTO INHALER AND INHALE DAILY.  . [DISCONTINUED] albuterol (PROVENTIL HFA;VENTOLIN HFA) 108 (90 BASE) MCG/ACT inhaler Inhale 2 puffs into the lungs every 6 (six) hours as needed for wheezing or shortness of breath.   . [DISCONTINUED] arformoterol (BROVANA) 15 MCG/2ML NEBU Take 2 mLs (15 mcg total) by nebulization 2 (two) times daily. (Patient not taking: Reported on 01/19/2014)  . [DISCONTINUED] Cholecalciferol (VITAMIN D-3) 5000 UNITS TABS Take 5,000 Units by mouth daily as needed (Dietary supplementation).   . [DISCONTINUED] doxycycline (VIBRAMYCIN) 100 MG capsule Take 100 mg by mouth 2 (two) times daily.   . [DISCONTINUED] ibuprofen (ADVIL,MOTRIN) 200 MG tablet Take 400-800 mg by mouth every 6 (six) hours as needed for mild pain or moderate pain.   . [DISCONTINUED] ipratropium (ATROVENT) 0.02 % nebulizer solution Take 0.5 mg by nebulization every 6 (six) hours as needed for wheezing or shortness of breath.   . [DISCONTINUED] predniSONE (DELTASONE) 10 MG tablet 4 tab ( ) po bid x 3 days, then  po bid x 3 days,  then  po bid x 3 days, then 10 mg po bid x 3 days, then  po daily x 3 days (Patient not taking: Reported on 04/13/2014)  . [DISCONTINUED] vitamin C (ASCORBIC ACID) 500 MG tablet Take 500 mg by mouth daily.  . [DISCONTINUED] VITAMIN E PO Take 1 tablet by mouth daily.

## 2014-04-13 NOTE — Patient Instructions (Signed)
Increase oxygen 4Liters pulse with exertion,  2Liters at rest Stop ipratroprium Cont Dulera two puff twice daily Cont Spiriva daily Use albuterol as needed  Refill on handheld albuterol sent Return 2 months

## 2014-04-13 NOTE — Assessment & Plan Note (Addendum)
Mod to severe copd with primary emphysema component Chronic resp failure, not on sufficient oxygen Rx Plan Increase oxygen 2L rest, 4L exertion Stop ipratroprium Cont Dulera two puff twice daily, use spacer  Cont Spiriva daily Use albuterol as needed  Refill on handheld albuterol sent Return 2 months

## 2014-04-20 DIAGNOSIS — I2510 Atherosclerotic heart disease of native coronary artery without angina pectoris: Secondary | ICD-10-CM | POA: Diagnosis not present

## 2014-05-05 ENCOUNTER — Encounter (HOSPITAL_COMMUNITY): Payer: Self-pay | Admitting: Emergency Medicine

## 2014-05-05 ENCOUNTER — Inpatient Hospital Stay (HOSPITAL_COMMUNITY)
Admission: EM | Admit: 2014-05-05 | Discharge: 2014-05-07 | DRG: 193 | Disposition: A | Discharge status: Home / Self Care | Payer: Medicare Other | Attending: Internal Medicine | Admitting: Internal Medicine

## 2014-05-05 ENCOUNTER — Emergency Department (HOSPITAL_COMMUNITY): Payer: Medicare Other

## 2014-05-05 ENCOUNTER — Other Ambulatory Visit (HOSPITAL_COMMUNITY): Payer: Self-pay

## 2014-05-05 DIAGNOSIS — Z86718 Personal history of other venous thrombosis and embolism: Secondary | ICD-10-CM | POA: Diagnosis not present

## 2014-05-05 DIAGNOSIS — Z87891 Personal history of nicotine dependence: Secondary | ICD-10-CM

## 2014-05-05 DIAGNOSIS — Z803 Family history of malignant neoplasm of breast: Secondary | ICD-10-CM

## 2014-05-05 DIAGNOSIS — R0602 Shortness of breath: Secondary | ICD-10-CM | POA: Diagnosis not present

## 2014-05-05 DIAGNOSIS — J9621 Acute and chronic respiratory failure with hypoxia: Secondary | ICD-10-CM | POA: Diagnosis present

## 2014-05-05 DIAGNOSIS — I10 Essential (primary) hypertension: Secondary | ICD-10-CM | POA: Diagnosis present

## 2014-05-05 DIAGNOSIS — Z880 Allergy status to penicillin: Secondary | ICD-10-CM

## 2014-05-05 DIAGNOSIS — J45909 Unspecified asthma, uncomplicated: Secondary | ICD-10-CM | POA: Diagnosis present

## 2014-05-05 DIAGNOSIS — Z885 Allergy status to narcotic agent status: Secondary | ICD-10-CM | POA: Diagnosis not present

## 2014-05-05 DIAGNOSIS — J1108 Influenza due to unidentified influenza virus with specified pneumonia: Principal | ICD-10-CM | POA: Diagnosis present

## 2014-05-05 DIAGNOSIS — Z820 Family history of epilepsy and other diseases of the nervous system: Secondary | ICD-10-CM

## 2014-05-05 DIAGNOSIS — Z7982 Long term (current) use of aspirin: Secondary | ICD-10-CM

## 2014-05-05 DIAGNOSIS — N179 Acute kidney failure, unspecified: Secondary | ICD-10-CM | POA: Diagnosis not present

## 2014-05-05 DIAGNOSIS — Z791 Long term (current) use of non-steroidal anti-inflammatories (NSAID): Secondary | ICD-10-CM | POA: Diagnosis not present

## 2014-05-05 DIAGNOSIS — J441 Chronic obstructive pulmonary disease with (acute) exacerbation: Secondary | ICD-10-CM | POA: Diagnosis present

## 2014-05-05 DIAGNOSIS — K219 Gastro-esophageal reflux disease without esophagitis: Secondary | ICD-10-CM | POA: Diagnosis present

## 2014-05-05 DIAGNOSIS — J181 Lobar pneumonia, unspecified organism: Secondary | ICD-10-CM | POA: Diagnosis present

## 2014-05-05 DIAGNOSIS — J159 Unspecified bacterial pneumonia: Secondary | ICD-10-CM | POA: Diagnosis present

## 2014-05-05 DIAGNOSIS — Z79899 Other long term (current) drug therapy: Secondary | ICD-10-CM

## 2014-05-05 DIAGNOSIS — Z888 Allergy status to other drugs, medicaments and biological substances status: Secondary | ICD-10-CM

## 2014-05-05 DIAGNOSIS — E876 Hypokalemia: Secondary | ICD-10-CM | POA: Diagnosis present

## 2014-05-05 DIAGNOSIS — Z9981 Dependence on supplemental oxygen: Secondary | ICD-10-CM | POA: Diagnosis not present

## 2014-05-05 DIAGNOSIS — J449 Chronic obstructive pulmonary disease, unspecified: Secondary | ICD-10-CM | POA: Diagnosis present

## 2014-05-05 DIAGNOSIS — E860 Dehydration: Secondary | ICD-10-CM | POA: Diagnosis not present

## 2014-05-05 DIAGNOSIS — J9601 Acute respiratory failure with hypoxia: Secondary | ICD-10-CM | POA: Diagnosis present

## 2014-05-05 LAB — CBC WITH DIFFERENTIAL/PLATELET
Basophils Absolute: 0 10*3/uL (ref 0.0–0.1)
Basophils Relative: 0 % (ref 0–1)
EOS ABS: 0 10*3/uL (ref 0.0–0.7)
EOS PCT: 0 % (ref 0–5)
HEMATOCRIT: 40.7 % (ref 39.0–52.0)
Hemoglobin: 12.6 g/dL — ABNORMAL LOW (ref 13.0–17.0)
LYMPHS ABS: 0.3 10*3/uL — AB (ref 0.7–4.0)
LYMPHS PCT: 2 % — AB (ref 12–46)
MCH: 30.4 pg (ref 26.0–34.0)
MCHC: 31 g/dL (ref 30.0–36.0)
MCV: 98.3 fL (ref 78.0–100.0)
Monocytes Absolute: 0.7 10*3/uL (ref 0.1–1.0)
Monocytes Relative: 4 % (ref 3–12)
NEUTROS PCT: 94 % — AB (ref 43–77)
Neutro Abs: 15.4 10*3/uL — ABNORMAL HIGH (ref 1.7–7.7)
PLATELETS: 260 10*3/uL (ref 150–400)
RBC: 4.14 MIL/uL — ABNORMAL LOW (ref 4.22–5.81)
RDW: 14.6 % (ref 11.5–15.5)
WBC: 16.5 10*3/uL — AB (ref 4.0–10.5)

## 2014-05-05 LAB — STREP PNEUMONIAE URINARY ANTIGEN: STREP PNEUMO URINARY ANTIGEN: NEGATIVE

## 2014-05-05 LAB — COMPREHENSIVE METABOLIC PANEL
ALT: 34 U/L (ref 0–53)
AST: 39 U/L — ABNORMAL HIGH (ref 0–37)
Albumin: 3.5 g/dL (ref 3.5–5.2)
Alkaline Phosphatase: 69 U/L (ref 39–117)
Anion gap: 12 (ref 5–15)
BILIRUBIN TOTAL: 0.4 mg/dL (ref 0.3–1.2)
BUN: 32 mg/dL — ABNORMAL HIGH (ref 6–23)
CHLORIDE: 94 mmol/L — AB (ref 96–112)
CO2: 32 mmol/L (ref 19–32)
CREATININE: 1.35 mg/dL (ref 0.50–1.35)
Calcium: 9.6 mg/dL (ref 8.4–10.5)
GFR calc Af Amer: 59 mL/min — ABNORMAL LOW (ref >90)
GFR calc non Af Amer: 50 mL/min — ABNORMAL LOW (ref >90)
Glucose, Bld: 153 mg/dL — ABNORMAL HIGH (ref 70–99)
Potassium: 3.2 mmol/L — ABNORMAL LOW (ref 3.5–5.1)
Sodium: 138 mmol/L (ref 135–145)
Total Protein: 6.7 g/dL (ref 6.0–8.3)

## 2014-05-05 LAB — BRAIN NATRIURETIC PEPTIDE: B Natriuretic Peptide: 30.4 pg/mL (ref 0.0–100.0)

## 2014-05-05 MED ORDER — PREDNISONE 20 MG PO TABS
20.0000 mg | ORAL_TABLET | Freq: Once | ORAL | Status: AC
  Administered 2014-05-05: 20 mg via ORAL
  Filled 2014-05-05: qty 1

## 2014-05-05 MED ORDER — ENOXAPARIN SODIUM 40 MG/0.4ML ~~LOC~~ SOLN
40.0000 mg | SUBCUTANEOUS | Status: DC
  Administered 2014-05-05 – 2014-05-06 (×2): 40 mg via SUBCUTANEOUS
  Filled 2014-05-05 (×3): qty 0.4

## 2014-05-05 MED ORDER — ALBUTEROL SULFATE (2.5 MG/3ML) 0.083% IN NEBU: 2.5000 mg | INHALATION_SOLUTION | RESPIRATORY_TRACT | Status: DC | PRN

## 2014-05-05 MED ORDER — ALBUTEROL SULFATE HFA 108 (90 BASE) MCG/ACT IN AERS: 2.0000 | INHALATION_SPRAY | Freq: Four times a day (QID) | RESPIRATORY_TRACT | Status: DC | PRN

## 2014-05-05 MED ORDER — HYDROCHLOROTHIAZIDE 25 MG PO TABS
25.0000 mg | ORAL_TABLET | Freq: Every day | ORAL | Status: DC
  Administered 2014-05-06 – 2014-05-07 (×2): 25 mg via ORAL
  Filled 2014-05-05 (×3): qty 1

## 2014-05-05 MED ORDER — DEXTROSE 5 % IV SOLN
1.0000 g | INTRAVENOUS | Status: DC
  Administered 2014-05-06: 1 g via INTRAVENOUS
  Filled 2014-05-05 (×2): qty 10

## 2014-05-05 MED ORDER — PANTOPRAZOLE SODIUM 40 MG PO TBEC
40.0000 mg | DELAYED_RELEASE_TABLET | Freq: Two times a day (BID) | ORAL | Status: DC
  Administered 2014-05-05 – 2014-05-07 (×5): 40 mg via ORAL
  Filled 2014-05-05 (×4): qty 1

## 2014-05-05 MED ORDER — GUAIFENESIN-DM 100-10 MG/5ML PO SYRP
5.0000 mL | ORAL_SOLUTION | ORAL | Status: DC | PRN
Start: 2014-05-05 — End: 2014-05-07
  Filled 2014-05-05: qty 5

## 2014-05-05 MED ORDER — IPRATROPIUM-ALBUTEROL 0.5-2.5 (3) MG/3ML IN SOLN
3.0000 mL | RESPIRATORY_TRACT | Status: DC
  Administered 2014-05-05 – 2014-05-06 (×4): 3 mL via RESPIRATORY_TRACT
  Filled 2014-05-05 (×3): qty 3

## 2014-05-05 MED ORDER — ALPRAZOLAM 0.5 MG PO TABS
0.5000 mg | ORAL_TABLET | Freq: Three times a day (TID) | ORAL | Status: DC | PRN
  Administered 2014-05-06 – 2014-05-07 (×2): 0.5 mg via ORAL
  Filled 2014-05-05 (×2): qty 1

## 2014-05-05 MED ORDER — DEXTROSE 5 % IV SOLN
500.0000 mg | Freq: Once | INTRAVENOUS | Status: AC
  Administered 2014-05-05: 500 mg via INTRAVENOUS
  Filled 2014-05-05: qty 500

## 2014-05-05 MED ORDER — ACETAMINOPHEN 650 MG RE SUPP: 650.0000 mg | Freq: Four times a day (QID) | RECTAL | Status: DC | PRN

## 2014-05-05 MED ORDER — ACETAMINOPHEN 325 MG PO TABS: 650.0000 mg | ORAL_TABLET | Freq: Four times a day (QID) | ORAL | Status: DC | PRN

## 2014-05-05 MED ORDER — ALBUTEROL SULFATE (2.5 MG/3ML) 0.083% IN NEBU
10.0000 mg | INHALATION_SOLUTION | Freq: Once | RESPIRATORY_TRACT | Status: AC
  Administered 2014-05-05: 10 mg via RESPIRATORY_TRACT
  Filled 2014-05-05: qty 12

## 2014-05-05 MED ORDER — METHYLPREDNISOLONE SODIUM SUCC 125 MG IJ SOLR
60.0000 mg | Freq: Four times a day (QID) | INTRAMUSCULAR | Status: DC
  Administered 2014-05-05 – 2014-05-06 (×3): 60 mg via INTRAVENOUS
  Filled 2014-05-05: qty 0.96
  Filled 2014-05-05: qty 2
  Filled 2014-05-05 (×3): qty 0.96

## 2014-05-05 MED ORDER — GUAIFENESIN ER 600 MG PO TB12
600.0000 mg | ORAL_TABLET | Freq: Two times a day (BID) | ORAL | Status: DC
  Administered 2014-05-05 – 2014-05-07 (×4): 600 mg via ORAL
  Filled 2014-05-05 (×5): qty 1

## 2014-05-05 MED ORDER — ONDANSETRON HCL 4 MG PO TABS: 4.0000 mg | ORAL_TABLET | Freq: Four times a day (QID) | ORAL | Status: DC | PRN

## 2014-05-05 MED ORDER — IPRATROPIUM BROMIDE 0.02 % IN SOLN
0.5000 mg | Freq: Once | RESPIRATORY_TRACT | Status: AC
  Administered 2014-05-05: 0.5 mg via RESPIRATORY_TRACT
  Filled 2014-05-05: qty 2.5

## 2014-05-05 MED ORDER — MOMETASONE FURO-FORMOTEROL FUM 200-5 MCG/ACT IN AERO
2.0000 | INHALATION_SPRAY | Freq: Two times a day (BID) | RESPIRATORY_TRACT | Status: DC
  Administered 2014-05-05 – 2014-05-07 (×4): 2 via RESPIRATORY_TRACT
  Filled 2014-05-05: qty 8.8

## 2014-05-05 MED ORDER — ASPIRIN-CAFFEINE 500-32.5 MG PO TABS: 2.0000 | ORAL_TABLET | Freq: Two times a day (BID) | ORAL | Status: DC | PRN

## 2014-05-05 MED ORDER — AZITHROMYCIN 500 MG PO TABS
500.0000 mg | ORAL_TABLET | ORAL | Status: DC
Start: 2014-05-06 — End: 2014-05-07
  Administered 2014-05-06 – 2014-05-07 (×2): 500 mg via ORAL
  Filled 2014-05-05 (×2): qty 1

## 2014-05-05 MED ORDER — ONDANSETRON HCL 4 MG/2ML IJ SOLN: 4.0000 mg | Freq: Four times a day (QID) | INTRAMUSCULAR | Status: DC | PRN

## 2014-05-05 MED ORDER — POTASSIUM CHLORIDE CRYS ER 20 MEQ PO TBCR
40.0000 meq | EXTENDED_RELEASE_TABLET | Freq: Once | ORAL | Status: AC
  Administered 2014-05-05: 40 meq via ORAL
  Filled 2014-05-05: qty 2

## 2014-05-05 MED ORDER — FUROSEMIDE 20 MG PO TABS
20.0000 mg | ORAL_TABLET | Freq: Every day | ORAL | Status: DC
  Administered 2014-05-06 – 2014-05-07 (×2): 20 mg via ORAL
  Filled 2014-05-05 (×3): qty 1

## 2014-05-05 MED ORDER — SALINE SPRAY 0.65 % NA SOLN
1.0000 | NASAL | Status: DC | PRN
  Filled 2014-05-05: qty 44

## 2014-05-05 MED ORDER — ENSURE ENLIVE PO LIQD
237.0000 mL | Freq: Every day | ORAL | Status: DC
  Administered 2014-05-05 – 2014-05-06 (×2): 237 mL via ORAL

## 2014-05-05 MED ORDER — MAGNESIUM SULFATE 2 GM/50ML IV SOLN
2.0000 g | Freq: Once | INTRAVENOUS | Status: AC
  Administered 2014-05-05: 2 g via INTRAVENOUS
  Filled 2014-05-05: qty 50

## 2014-05-05 MED ORDER — DEXTROSE 5 % IV SOLN
1.0000 g | Freq: Once | INTRAVENOUS | Status: AC
  Administered 2014-05-05: 1 g via INTRAVENOUS
  Filled 2014-05-05: qty 10

## 2014-05-05 NOTE — Progress Notes (Signed)
NURSING PROGRESS NOTE  [name] Bettersddie Gehlhausen JR Jr. [id number] Admission Data: 05/05/2014 7:34 PM Attending Provider: Guadalupe NorthNishant Dhungel, MD WUJ:[id number]:[place], [name], NP Code Status: FULL  [name] Bettersddie Wolfgang JR Jr. is a 74 y.o. male patient admitted from ED:  -No acute distress noted.  -No complaints of shortness of breath.  -No complaints of chest pain.   Cardiac Monitoring: Box # 1 in place. Cardiac monitor yields:sinus tachycardia.  Blood pressure 141/73, pulse 105, temperature 98.3 F (36.8 C), temperature source Oral, resp. rate 25, height 5\' 8"  (1.727 m), weight 81.33 kg (179 lb 4.8 oz), SpO2 95 %.   IV Fluids:  IV intact to LFA and RAC.  Allergies:  Brovana; Codeine; and Other  Past Medical History:   has a past medical history of COPD (chronic obstructive pulmonary disease); Chronic bronchitis; Hypertension; DVT (deep venous thrombosis) (1990's); On home oxygen therapy; GERD (gastroesophageal reflux disease); Daily headache; Migraine; Pneumonia; and Acute respiratory failure with hypoxia (01/2014).  Past Surgical History:   has past surgical history that includes Tonsillectomy (1949) and Inguinal hernia repair (Left, ~ 1990).   Skin: intact  Patient/Family orientated to room. Information packet given to patient/family. Admission inpatient armband information verified with patient/family to include name and date of birth and placed on patient arm. Side rails up x 2, fall assessment and education completed with patient/family. Patient/family able to verbalize understanding of risk associated with falls and verbalized understanding to call for assistance before getting out of bed. Call light within reach. Patient/family able to voice and demonstrate understanding of unit orientation instructions.    Will continue to evaluate and treat per MD orders.  [name], RN

## 2014-05-05 NOTE — H&P (Signed)
[facility] History and Physical  Ammar Elberta SpanielLashley JR Jr. BJY:[id number]:[id number] DOB: 1940/06/10 DOA: 05/05/2014  Referring physician: Dr. 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 Delford FieldWright), GERD who presented to the ED with 4 day history of progressive shortness of breath and cough with greenish phlegm. Patient saw his PCP and was prescribed a tapering dose of oral prednisone however for the last 3 days symptoms have worsened and has progressive dyspnea. This morning he used his nebulizer and even increase his oxygen level to 4 L and was saturating in the high 80s. He reports some low-grade fever and some nasal congestion as well. He also reports some chest tightness this morning. Patient denies headache, dizziness, , nausea , vomiting, palpitations, abdominal pain, bowel or urinary symptoms. Denies change in weight or appetite. Denies any sick contacts or recent travel. quit smoking for over 15 years.  Course in the ED She was tachycardic up to 120s, to, O2 sat in low 90s on 2-3 L. Blood will done showed WC of 16.5, hemoglobin of 12.6, normal platelets, chemistry showed sodium 138, K of 3.2, chloride 94, BUN of 32 and creatinine 1.35. Chest x-ray showed increased patchy opacity in the left lung base concerning for acute infection.  She was given albuterol and Atrovent nebs with minimal relief. He was also given a dose of IV Rocephin and azithromycin and 20 mg oral prednisone. Hospitalist admission requested to telemetry   Review of Systems:  Constitutional: Denies fever, chills, diaphoresis, appetite change and fatigue.  HEENT: Denies visual or hearing symptoms, difficulty swallowing, neck pain or stiffness, reports nasal congestion Respiratory:SOB, DOE, cough, chest tightness,  and wheezing.   Cardiovascular: Denies chest pain,  palpitations and leg swelling.  Gastrointestinal: Denies nausea, vomiting, abdominal pain, diarrhea, constipation, blood in stool and abdominal distention.  Genitourinary: Denies dysuria,  hematuria, flank pain and difficulty urinating.  Endocrine: Denies: hot or cold intolerance,  polyuria, polydipsia. Musculoskeletal: Denies myalgias, back pain, joint or swelling Skin: Denies  rash and wound.  Neurological: Denies dizziness,  syncope, weakness, light-headedness, numbness and headaches.  Hematological: Denies adenopathy. Psychiatric/Behavioral: Denies confusion  Past Medical History  Diagnosis Date  . COPD (chronic obstructive pulmonary disease)   . Chronic bronchitis     "get it q yr" (07/17/2013)  . Hypertension   . DVT (deep venous thrombosis) 1990's    "?LLE"  . On home oxygen therapy     "2L 24/7 since Monday" (07/17/2013)  . GERD (gastroesophageal reflux disease)     "related to RX I take" (07/17/2013)  . Daily headache   . Migraine     "all my life; no really bad migraines in 8-9 since work stress is gone" (07/17/2013)  . Pneumonia     "several times"  . Acute respiratory failure with hypoxia 01/2014   Past Surgical History  Procedure Laterality Date  . Tonsillectomy  1949  . Inguinal hernia repair Left ~ 1990   Social History:  reports that he quit smoking about 16 years ago. His smoking use included Cigarettes. He has a 10 pack-year smoking history. He has never used smokeless tobacco. He reports that he does not drink alcohol or use illicit drugs.  Allergies  Allergen Reactions  . Brovana [Arformoterol]     Ineffective--exacerbated coughing/congestion  . Codeine Other (See Comments)    Hallucinations   .  Other Swelling    Bicillin injection in 1961--swelling in lower extremities and joints, and whelps up arm.  Has taken penicillin since w/o reaction.    Family History  Problem Relation Age of Onset  . Breast cancer Mother   . Alzheimer's disease Father      Prior to Admission medications   Medication Sig Start Date End Date Taking? Authorizing Provider  albuterol (ACCUNEB) 0.63 MG/3ML nebulizer solution Take 3 mLs by nebulization every 4 (four) hours as needed. 03/23/14  Yes Historical Provider, MD  albuterol (PROVENTIL HFA;VENTOLIN HFA) 108 (90 BASE) MCG/ACT inhaler Inhale 2 puffs into the lungs every 6 (six) hours as needed for wheezing or shortness of breath. 04/13/14  Yes Storm Frisk, MD  ALPRAZolam Prudy Feeler) 0.5 MG tablet Take 0.5 mg by mouth 3 (three) times daily as needed for anxiety.  04/06/14  Yes Historical Provider, MD  Aspirin-Caffeine (BAYER BACK & BODY PAIN EX ST) 500-32.5 MG TABS Take 2 tablets by mouth 2 (two) times daily as needed.    Yes Historical Provider, MD  furosemide (LASIX) 40 MG tablet Take 20 mg by mouth daily.   Yes Historical Provider, MD  hydrochlorothiazide (HYDRODIURIL) 25 MG tablet Take 25 mg by mouth daily.   Yes Historical Provider, MD  ibuprofen (ADVIL,MOTRIN) 200 MG tablet Take 300-600 mg by mouth every 6 (six) hours as needed for moderate pain.    Yes Historical Provider, MD  pantoprazole (PROTONIX) 40 MG tablet Take 1 tablet (40 mg total) by mouth 2 (two) times daily. 11/01/13  Yes Vassie Loll, MD  sodium chloride (OCEAN) 0.65 % SOLN nasal spray Place 1 spray into the nose as needed for congestion (Dry nasal passages).   Yes Historical Provider, MD  SPIRIVA HANDIHALER 18 MCG inhalation capsule PLACE 1 CAPSULE (18 MCG TOTAL) INTO INHALER AND INHALE DAILY. 11/12/13  Yes Storm Frisk, MD  mometasone-formoterol (DULERA) 200-5 MCG/ACT AERO Inhale 2 puffs into the lungs 2 (two) times daily.    Historical Provider, MD     Physical Exam:  Filed Vitals:   05/05/14 0945 05/05/14 1012 05/05/14 1035 05/05/14 1254  BP: 133/50  116/66 124/68  Pulse: 120  102 103  Temp: 98.8 F (37.1 C)   98.5 F (36.9 C)  TempSrc: Oral   Oral  Resp: 35  22 33  Weight: 78.472 kg (173 lb)     SpO2: 91% 94% 95% 93%     Constitutional: Vital signs reviewed.  Elderly male lying in bed appears dyspneic and unable to speak in full sentences HEENT: no pallor, no icterus, moist oral mucosa, no cervical lymphadenopathy Cardiovascular: S1 and S2 tachycardic, no murmurs rub or gallop Chest: Diffuse wheezing bilaterally with diminished breath sounds, no crackles Abdominal: Soft. Non-tender, non-distended, bowel sounds are normal,  Ext: warm, no edema Neurological: Alert and oriented   Labs on Admission:  Basic Metabolic Panel:  Recent Labs Lab 05/05/14 0950  NA 138  K 3.2*  CL 94*  CO2 32  GLUCOSE 153*  BUN 32*  CREATININE 1.35  CALCIUM 9.6   Liver Function Tests:  Recent Labs Lab 05/05/14 0950  AST 39*  ALT 34  ALKPHOS 69  BILITOT 0.4  PROT 6.7  ALBUMIN 3.5   No results for input(s): LIPASE, AMYLASE in the last 168 hours. No results for input(s): AMMONIA in the last 168 hours. CBC:  Recent Labs Lab 05/05/14 0950  WBC 16.5*  NEUTROABS 15.4*  HGB 12.6*  HCT 40.7  MCV 98.3  PLT  260   Cardiac Enzymes: No results for input(s): CKTOTAL, CKMB, CKMBINDEX, TROPONINI in the last 168 hours. BNP: Invalid input(s): POCBNP CBG: No results for input(s): GLUCAP in the last 168 hours.  Radiological Exams on Admission: Dg Chest Port 1 View  05/05/2014   CLINICAL DATA:  74 year old male with shortness of Breath. Current history of COPD. Initial encounter.  EXAM: PORTABLE CHEST - 1 VIEW  COMPARISON:  01/19/2014 and earlier.  FINDINGS: Portable AP upright view at 0959 hrs. Stable lung volumes. Increased patchy opacity at the left lung base superimposed on areas of both attenuated bronchovascular opacity and increased interstitial opacity. Normal cardiac size and mediastinal contours. Visualized tracheal air column is within normal limits. No pneumothorax or pleural effusion identified.  IMPRESSION: Increased patchy opacity at the left lung base compatible with acute infectious exacerbation of  chronic lung disease.   Electronically Signed   By: Odessa Fleming M.D.   On: 05/05/2014 10:18    EKG: Sinus tachycardia at 101, no ST-T changes  Assessment/Plan  Principal Problem:   Acute respiratory failure with hypoxia Secondary to COPD exacerbation and community acquired pneumonia. Will place on IV Solu metal 60 mg every 6 hours, scheduled DuoNeb every 4 hours, albuterol nebs every 2 hours as needed. Resume home inhalers. -Empiric antibiotics. -Follow blood culture, sputum culture. -Continue O2 via nasal cannula. -Check flu PCR.  Active Problems:   Community acquired pneumonia/ lobar pneumonia continue IV rocephin and azithromycin. Follow blood cx. Sputum cx, urine for strep and legionella. Supportive care with Tylenol and antitussives. 02 via [place] .Marland Kitchen Continue albuterol and Atrovent nebs  Hypokalemia Replenish with KCl. Will order 2 g magnesium sulfate.    Essential hypertension Stable. 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 Delford Field as outpatient. On continuous home O2. During recent evaluation he was recommended to continue 2 L oxygen with rest and 4 L at night.    GERD Continue PPI        Diet: Low-sodium  DVT prophylaxis: sq lovenox   Code Status: Full code Family Communication: None at bedside Disposition Plan: Admit to telemetry  Jentri Aye, [facility] Pager [phone]  Total time spent on admission 70 minutes  If 7PM-7AM, please contact night-coverage [email or web address] Password [email or web address] 05/05/2014, 1:25 PM

## 2014-05-05 NOTE — Progress Notes (Signed)
Nutrition Brief Note  Patient seen per RN request. He is requesting supplements.  Wt Readings from Last 15 Encounters:  05/05/14 173 lb (78.472 kg)  04/13/14 173 lb (78.472 kg)  01/21/14 170 lb 9.6 oz (77.384 kg)  11/12/13 167 lb 6.4 oz (75.932 kg)  11/01/13 158 lb 11.7 oz (72 kg)  08/11/13 169 lb 9.6 oz (76.93 kg)  07/17/13 168 lb 10.4 oz (76.5 kg)  04/09/13 168 lb 9.6 oz (76.476 kg)  12/21/12 158 lb 15.2 oz (72.1 kg)  10/02/12 167 lb (75.751 kg)  03/12/12 166 lb 6.4 oz (75.479 kg)  09/04/11 163 lb 6.4 oz (74.118 kg)  03/07/11 171 lb 9.6 oz (77.837 kg)  10/19/10 149 lb 12.8 oz (67.949 kg)  09/28/10 145 lb 3.2 oz (65.31862 kg)   74 year old male with history of hypertension, 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 Delford FieldWright), GERD who presented to the ED with 4 day history of progressive shortness of breath and cough with greenish phlegm. Patient saw his PCP and was prescribed a tapering dose of oral prednisone however for the last 3 days symptoms have worsened and has progressive dyspnea. This morning he used his nebulizer and even increase his oxygen level to 4 L and was saturating in the high 80s. He reports some low-grade fever and some nasal congestion as well. He also reports some chest tightness this morning.  Pt denies any changes in his appetite. He describes himself as a "birdie eater" as he never consumes that much at one time. He denies any difficulty chewing or swallowing foods.   He reports that he drinks Ensure at home at bedtime and is requesting to receive supplement per his home regimen. RD will order.  Pt expressed gratefulness for visit and denied any further nutritional needs at this time.  Body mass index is 26.68 kg/(m^2). Patient meets criteria for overweight based on current BMI.   Current diet order is 2 gram sodium, patient is consuming approximately n/a% of meals at this time. Labs and medications reviewed.   No nutrition interventions  warranted at this time. If nutrition issues arise, please consult RD.   Zannah Melucci A. Mayford KnifeWilliams, RD, LDN, CDE Pager: [phone] After hours Pager: [phone]

## 2014-05-05 NOTE — ED Notes (Signed)
Pt c/o increasing shortness of breath since Thursday-- saw PCP and started on prednisone. Pt is unable to speak in complete sentences. Moist productive cough, for thick yellowish sputum.

## 2014-05-05 NOTE — Progress Notes (Signed)
Report received from ED nurse Marchelle FolksAmanda for patient to be admitted into 5w03

## 2014-05-06 LAB — BASIC METABOLIC PANEL
Anion gap: 9 (ref 5–15)
BUN: 29 mg/dL — AB (ref 6–23)
CALCIUM: 9.4 mg/dL (ref 8.4–10.5)
CO2: 35 mmol/L — AB (ref 19–32)
CREATININE: 1.07 mg/dL (ref 0.50–1.35)
Chloride: 94 mmol/L — ABNORMAL LOW (ref 96–112)
GFR calc non Af Amer: 67 mL/min — ABNORMAL LOW (ref >90)
GFR, EST AFRICAN AMERICAN: 77 mL/min — AB (ref >90)
GLUCOSE: 161 mg/dL — AB (ref 70–99)
Potassium: 3.5 mmol/L (ref 3.5–5.1)
Sodium: 138 mmol/L (ref 135–145)

## 2014-05-06 LAB — LEGIONELLA ANTIGEN, URINE

## 2014-05-06 LAB — CBC
HCT: 38.4 % — ABNORMAL LOW (ref 39.0–52.0)
HEMOGLOBIN: 11.9 g/dL — AB (ref 13.0–17.0)
MCH: 30.3 pg (ref 26.0–34.0)
MCHC: 31 g/dL (ref 30.0–36.0)
MCV: 97.7 fL (ref 78.0–100.0)
Platelets: 269 10*3/uL (ref 150–400)
RBC: 3.93 MIL/uL — AB (ref 4.22–5.81)
RDW: 14.6 % (ref 11.5–15.5)
WBC: 9.8 10*3/uL (ref 4.0–10.5)

## 2014-05-06 LAB — INFLUENZA PANEL BY PCR (TYPE A & B)
H1N1FLUPCR: DETECTED — AB
INFLAPCR: POSITIVE — AB
Influenza B By PCR: NEGATIVE

## 2014-05-06 LAB — HIV ANTIBODY (ROUTINE TESTING W REFLEX): HIV SCREEN 4TH GENERATION: NONREACTIVE

## 2014-05-06 MED ORDER — IPRATROPIUM-ALBUTEROL 0.5-2.5 (3) MG/3ML IN SOLN
3.0000 mL | Freq: Three times a day (TID) | RESPIRATORY_TRACT | Status: DC
  Administered 2014-05-06 – 2014-05-07 (×3): 3 mL via RESPIRATORY_TRACT
  Filled 2014-05-06 (×4): qty 3

## 2014-05-06 MED ORDER — OSELTAMIVIR PHOSPHATE 75 MG PO CAPS
75.0000 mg | ORAL_CAPSULE | Freq: Two times a day (BID) | ORAL | Status: DC
  Administered 2014-05-06 – 2014-05-07 (×3): 75 mg via ORAL
  Filled 2014-05-06 (×4): qty 1

## 2014-05-06 NOTE — Progress Notes (Signed)
UR Completed.  [phone]  

## 2014-05-06 NOTE — Progress Notes (Signed)
Patient Demographics  Gaurav Baldree, is a 74 y.o. male, DOB - 29-Jan-1941, ZOX:[id number]  Admit date - 05/05/2014   Admitting Physician Teddrick North, MD  Outpatient Primary MD for the patient is Millsaps, Joelene Millin, NP  LOS - 1   Chief Complaint  Patient presents with  . Shortness of Breath        Subjective:   [name] today has, No headache, No chest pain, No abdominal pain - No Nausea, No new weakness tingling or numbness, No Cough - SOB.    Assessment & Plan    1. Acute on chronic hypoxic respiratory failure secondary to community-acquired pneumonia. In a patient with underlying COPD gold stage C with 2 L nasal cannula oxygen at home.  He is much better, no shortness of breath or discomfort this morning, continue IV antibiotics which are azithromycin and Rocephin, continue supportive care with nebulizer treatments and oxygen as needed. He uses 2 L at home on a continuous basis.    2. COPD cold stage C. At baseline no wheezing, stop steroids. Further treatment as above.    3. Essential hypertension. Blood pressure is stable. He is on no scheduled blood pressure medications. We will continue to monitor.    4. GERD. On PPI continue.    Code Status: full  Family Communication: none  Disposition Plan: home   Procedures none   Consults   none   Medications  Scheduled Meds: . azithromycin  500 mg Oral Q24H  . cefTRIAXone (ROCEPHIN)  IV  1 g Intravenous Q24H  . enoxaparin (LOVENOX) injection  40 mg Subcutaneous Q24H  . feeding supplement (ENSURE ENLIVE)  237 mL Oral QHS  . furosemide  20 mg Oral Daily  . guaiFENesin  600 mg Oral BID  . hydrochlorothiazide  25 mg Oral Daily  . ipratropium-albuterol  3 mL Nebulization TID  . mometasone-formoterol  2 puff  Inhalation BID  . pantoprazole  40 mg Oral BID   Continuous Infusions:  PRN Meds:.acetaminophen **OR** acetaminophen, albuterol, ALPRAZolam, guaiFENesin-dextromethorphan, ondansetron **OR** ondansetron (ZOFRAN) IV, sodium chloride  DVT Prophylaxis  Lovenox   Lab Results  Component Value Date   PLT 269 05/06/2014    Antibiotics     Anti-infectives    Start     Dose/Rate Route Frequency Ordered Stop   05/06/14 1000  cefTRIAXone (ROCEPHIN) 1 g in dextrose 5 % 50 mL IVPB     1 g 100 mL/hr over 30 Minutes Intravenous Every 24 hours 05/05/14 1337 05/13/14 0959   05/06/14 1000  azithromycin (ZITHROMAX) tablet 500 mg     500 mg Oral Every 24 hours 05/05/14 1337 05/13/14 0959   05/05/14 1215  cefTRIAXone (ROCEPHIN) 1 g in dextrose 5 % 50 mL IVPB     1 g 100 mL/hr over 30 Minutes Intravenous  Once 05/05/14 1201 05/05/14 2114   05/05/14 1215  azithromycin (ZITHROMAX) 500 mg in dextrose 5 % 250 mL IVPB     500 mg 250 mL/hr over 60 Minutes Intravenous  Once 05/05/14 1201 05/05/14 2114          Objective:   Filed Vitals:   05/06/14 0307 05/06/14 0557 05/06/14 0745 05/06/14 0750  BP:  124/78    Pulse:  90    Temp:  98.3 F (36.8 C)    TempSrc:  Oral    Resp:  20    Height:      Weight:      SpO2: 90% 92% 91% 91%    Wt Readings from Last 3 Encounters:  05/05/14 81.33 kg (179 lb 4.8 oz)  04/13/14 78.472 kg (173 lb)  01/21/14 77.384 kg (170 lb 9.6 oz)     Intake/Output Summary (Last 24 hours) at 05/06/14 1131 Last data filed at 05/06/14 1042  Gross per 24 hour  Intake    620 ml  Output    680 ml  Net    -60 ml     Physical Exam  Awake Alert, Oriented X 3, No new F.N deficits, Normal affect [place].AT,PERRAL Supple Neck,No JVD, No cervical lymphadenopathy appriciated.  Symmetrical Chest wall movement, Good air movement bilaterally, CTAB RRR,No Gallops,Rubs or new Murmurs, No Parasternal Heave +ve B.Sounds, Abd Soft, No tenderness, No organomegaly appriciated, No  rebound - guarding or rigidity. No Cyanosis, Clubbing or edema, No new Rash or bruise      Data Review   Micro Results Recent Results (from the past 240 hour(s))  Culture, blood (routine x 2)     Status: None (Preliminary result)   Collection Time: 05/05/14  9:50 AM  Result Value Ref Range Status   Specimen Description BLOOD BLOOD LEFT FOREARM  Final   Special Requests BOTTLES DRAWN AEROBIC AND ANAEROBIC 5CC  Final   Culture   Final           BLOOD CULTURE RECEIVED NO GROWTH TO DATE CULTURE WILL BE HELD FOR 5 DAYS BEFORE ISSUING A FINAL NEGATIVE REPORT [facility]formed [facility]    Report Status PENDING  Incomplete  Culture, blood (routine x 2)     Status: None (Preliminary result)   Collection Time: 05/05/14  4:05 PM  Result Value Ref Range Status   Specimen Description BLOOD RIGHT ARM  Final   Special Requests BOTTLES DRAWN AEROBIC ONLY 7CC  Final   Culture   Final           BLOOD CULTURE RECEIVED NO GROWTH TO DATE CULTURE WILL BE HELD FOR 5 DAYS BEFORE ISSUING A FINAL NEGATIVE REPORT Performed at [facility]    Report Status PENDING  Incomplete    Radiology Reports [place]  05/05/2014   CLINICAL DATA:  74 year old male with shortness of Breath. Current history of COPD. Initial encounter.  EXAM: PORTABLE CHEST - 1 VIEW  COMPARISON:  01/19/2014 and earlier.  FINDINGS: Portable AP upright view at 0959 hrs. Stable lung volumes. Increased patchy opacity at the left lung base superimposed on areas of both attenuated bronchovascular opacity and increased interstitial opacity. Normal cardiac size and mediastinal contours. Visualized tracheal air column is within normal limits. No pneumothorax or pleural effusion identified.  IMPRESSION: Increased patchy opacity at the left lung base compatible with acute infectious exacerbation of chronic lung disease.   Electronically Signed   By: Odessa FlemingH  Hall M.D.   On: 05/05/2014 10:18     CBC  Recent Labs Lab  05/05/14 0950 05/06/14 0505  WBC 16.5* 9.8  HGB 12.6* 11.9*  HCT 40.7 38.4*  PLT 260 269  MCV 98.3 97.7  MCH 30.4 30.3  MCHC 31.0 31.0  RDW 14.6 14.6  LYMPHSABS 0.3*  --   MONOABS 0.7  --   EOSABS 0.0  --   BASOSABS 0.0  --  Chemistries   Recent Labs Lab 05/05/14 0950 05/06/14 0505  NA 138 138  K 3.2* 3.5  CL 94* 94*  CO2 32 35*  GLUCOSE 153* 161*  BUN 32* 29*  CREATININE 1.35 1.07  CALCIUM 9.6 9.4  AST 39*  --   ALT 34  --   ALKPHOS 69  --   BILITOT 0.4  --    ------------------------------------------------------------------------------------------------------------------ estimated creatinine clearance is 59.5 mL/min (by C-G formula based on Cr of 1.07). ------------------------------------------------------------------------------------------------------------------ No results for input(s): HGBA1C in the last 72 hours. ------------------------------------------------------------------------------------------------------------------ No results for input(s): CHOL, HDL, LDLCALC, TRIG, CHOLHDL, LDLDIRECT in the last 72 hours. ------------------------------------------------------------------------------------------------------------------ No results for input(s): TSH, T4TOTAL, T3FREE, THYROIDAB in the last 72 hours.  Invalid input(s): FREET3 ------------------------------------------------------------------------------------------------------------------ No results for input(s): VITAMINB12, FOLATE, FERRITIN, TIBC, IRON, RETICCTPCT in the last 72 hours.  Coagulation profile No results for input(s): INR, PROTIME in the last 168 hours.  No results for input(s): DDIMER in the last 72 hours.  Cardiac Enzymes No results for input(s): CKMB, TROPONINI, MYOGLOBIN in the last 168 hours.  Invalid input(s): CK ------------------------------------------------------------------------------------------------------------------ Invalid input(s): POCBNP     Time Spent  in minutes   35   Fransisco Messmer K M.D on 05/06/2014 at 11:31 AM  Between 7am to 7pm - Pager - [phone]  After 7pm go to [email or web address] - password [facility]  [facility]   Office  [phone]

## 2014-05-07 MED ORDER — AZITHROMYCIN 500 MG PO TABS: 500.0000 mg | ORAL_TABLET | Freq: Every day | ORAL | Status: DC

## 2014-05-07 MED ORDER — OSELTAMIVIR PHOSPHATE 75 MG PO CAPS: 75.0000 mg | ORAL_CAPSULE | Freq: Two times a day (BID) | ORAL | Status: DC

## 2014-05-07 NOTE — Discharge Summary (Signed)
[name] [name]., is a 74 y.o. male  DOB 1940-09-07  MRN [id number].  Admission date:  05/05/2014  Admitting Physician  Aryan North, MD  Discharge Date:  05/07/2014   Primary MD  [name], NP  Recommendations for primary care physician for things to follow:   Repeat CBC, BMP and a 2 view chest x-ray in a week. Monitor blood pressure closely.   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 COPD (chronic obstructive pulmonary disease)   . Chronic bronchitis     "get it q yr" (07/17/2013)  . Hypertension   . DVT (deep venous thrombosis) 1990's    "?LLE"  . On home oxygen therapy     "2L 24/7 since Monday" (07/17/2013)  . GERD (gastroesophageal reflux disease)     "related to RX I take" (07/17/2013)  . Daily headache   . Migraine     "all my life; no really bad migraines in 8-9 since work stress is gone" (07/17/2013)  . Pneumonia     "several times"  . Acute respiratory failure with hypoxia 01/2014    Past Surgical History  Procedure Laterality Date  . Tonsillectomy  1949  . Inguinal hernia repair Left ~ 1990       History of present illness and  Hospital Course:     Kindly see H&P for history of present illness and admission details, please review complete Labs, Consult reports and Test reports for all details in brief  HPI  from the history and physical done on the day of  admission   74 year old male with history of hypertension, 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 Delford Field), GERD who presented to the ED with 4 day history of progressive shortness of breath and cough with greenish phlegm. Patient saw his PCP and was prescribed a tapering dose of oral prednisone however for the last 3 days symptoms have worsened and has progressive dyspnea. This morning he used his nebulizer and even increase his oxygen level to 4 L and was saturating in the high 80s. He reports some low-grade fever and some nasal congestion as well. He also reports some chest tightness this morning. Patient denies headache, dizziness, , nausea , vomiting, palpitations, abdominal pain, bowel or urinary symptoms. Denies change in weight or appetite. Denies any sick contacts or recent travel. quit smoking for over 15 years.  Course in the ED She was tachycardic up to 120s, to, O2 sat in low 90s on 2-3 L. Blood will done showed WC of 16.5, hemoglobin of 12.6, normal platelets, chemistry  showed sodium 138, K of 3.2, chloride 94, BUN of 32 and creatinine 1.35. Chest x-ray showed increased patchy opacity in the left lung base concerning for acute infection.  She was given albuterol and Atrovent nebs with minimal relief. He was also given a dose of IV Rocephin and azithromycin and 20 mg oral prednisone. Hospitalist admission requested to telemetry   Hospital Course    1. Acute on chronic hypoxic respiratory failure secondary to influenza infection with superimposed bacterial community-acquired pneumonia. In a patient with underlying COPD gold stage C with 2 L nasal cannula oxygen at home.  He is much better, no shortness of breath or discomfort this morning, he received Tamiflu along with empiric IV antibiotics which are azithromycin and Rocephin, much better and will be transitioned to 4 more days of Tamiflu along with azithromycin, he already has home oxygen and nebulizer treatments  which she will continue. He is feeling much better and close to baseline now.       2. COPD cold stage C. At baseline no wheezing, stopped steroids yesterday. Further treatment as above. Follow with PCP and primary pulmonologist post discharge.    3. Essential hypertension. Blood pressure is stable. He is on no scheduled blood pressure medications. We will continue to monitor. Have discontinued his HCTZ as he is already on Lasix. Request PCP to monitor blood pressure and adjust blood pressure medications at as needed.    4. GERD. On PPI continue.     Discharge Condition: Stable   Follow UP  Follow-up Information    Follow up with Millsaps, [name], NP. Schedule an appointment as soon as possible for a visit in 1 week.   Contact information:   [facility] [address] [place] [id number] [phone]         Discharge Instructions  and  Discharge Medications      Discharge Instructions    Diet - low sodium heart healthy    Complete by:  As directed      Discharge instructions    Complete by:  As directed   Follow with Primary MD Egbert GaribaldiMillsaps, KIMBERLY M, NP in 7 days   Get CBC, CMP, 2 view Chest X ray checked  by Primary MD next visit.    Activity: As tolerated with Full fall precautions use walker/cane & assistance as needed   Disposition Home     Diet: Heart Healthy    For Heart failure patients - Check your Weight same time everyday, if you gain over 2 pounds, or you develop in leg swelling, experience more shortness of breath or chest pain, call your Primary MD immediately. Follow Cardiac Low Salt Diet and 1.5 lit/day fluid restriction.   On your next visit with your primary care physician please Get Medicines reviewed and adjusted.   Please request your Prim.MD to go over all Hospital Tests and Procedure/Radiological results at the follow up, please get all Hospital records sent to your Prim MD by signing hospital release before  you go home.   If you experience worsening of your admission symptoms, develop shortness of breath, life threatening emergency, suicidal or homicidal thoughts you must seek medical attention immediately by calling 911 or calling your MD immediately  if symptoms less severe.  You Must read complete instructions/literature along with all the possible adverse reactions/side effects for all the Medicines you take and that have been prescribed to you. Take any new Medicines after you have completely understood and accpet all the  possible adverse reactions/side effects.   Do not drive, operating heavy machinery, perform activities at heights, swimming or participation in water activities or provide baby sitting services if your were admitted for syncope or siezures until you have seen by Primary MD or a Neurologist and advised to do so again.  Do not drive when taking Pain medications.    Do not take more than prescribed Pain, Sleep and Anxiety Medications  Special Instructions: If you have smoked or chewed Tobacco  in the last 2 yrs please stop smoking, stop any regular Alcohol  and or any Recreational drug use.  Wear Seat belts while driving.   Please note  You were cared for by a hospitalist during your hospital stay. If you have any questions about your discharge medications or the care you received while you were in the hospital after you are discharged, you can call the unit and asked to speak with the hospitalist on call if the hospitalist that took care of you is not available. Once you are discharged, your primary care physician will handle any further medical issues. Please note that NO REFILLS for any discharge medications will be authorized once you are discharged, as it is imperative that you return to your primary care physician (or establish a relationship with a primary care physician if you do not have one) for your aftercare needs so that they can reassess your need for medications  and monitor your lab values.     Increase activity slowly    Complete by:  As directed             Medication List    STOP taking these medications        hydrochlorothiazide 25 MG tablet  Commonly known as:  HYDRODIURIL     ibuprofen 200 MG tablet  Commonly known as:  ADVIL,MOTRIN      TAKE these medications        albuterol 0.63 MG/3ML nebulizer solution  Commonly known as:  ACCUNEB  Take 3 mLs by nebulization every 4 (four) hours as needed.     albuterol 108 (90 BASE) MCG/ACT inhaler  Commonly known as:  PROVENTIL HFA;VENTOLIN HFA  Inhale 2 puffs into the lungs every 6 (six) hours as needed for wheezing or shortness of breath.     ALPRAZolam 0.5 MG tablet  Commonly known as:  XANAX  Take 0.5 mg by mouth 3 (three) times daily as needed for anxiety.     azithromycin 500 MG tablet  Commonly known as:  ZITHROMAX  Take 1 tablet (500 mg total) by mouth daily.     BAYER BACK & BODY PAIN EX ST 500-32.5 MG Tabs  Generic drug:  Aspirin-Caffeine  Take 2 tablets by mouth 2 (two) times daily as needed.     furosemide 40 MG tablet  Commonly known as:  LASIX  Take 20 mg by mouth daily.     mometasone-formoterol 200-5 MCG/ACT Aero  Commonly known as:  DULERA  Inhale 2 puffs into the lungs 2 (two) times daily.     oseltamivir 75 MG capsule  Commonly known as:  TAMIFLU  Take 1 capsule (75 mg total) by mouth 2 (two) times daily.     pantoprazole 40 MG tablet  Commonly known as:  PROTONIX  Take 1 tablet (40 mg total) by mouth 2 (two) times daily.     sodium chloride 0.65 % Soln nasal spray  Commonly known as:  OCEAN  Place 1 spray into the nose as  needed for congestion (Dry nasal passages).     SPIRIVA HANDIHALER 18 MCG inhalation capsule  Generic drug:  tiotropium  PLACE 1 CAPSULE (18 MCG TOTAL) INTO INHALER AND INHALE DAILY.          Diet and Activity recommendation: See Discharge Instructions above   Consults obtained - none   Major procedures and  Radiology Reports - PLEASE review detailed and final reports for all details, in brief -       Dg Chest Port 1 View  05/05/2014   CLINICAL DATA:  74 year old male with shortness of Breath. Current history of COPD. Initial encounter.  EXAM: PORTABLE CHEST - 1 VIEW  COMPARISON:  01/19/2014 and earlier.  FINDINGS: Portable AP upright view at 0959 hrs. Stable lung volumes. Increased patchy opacity at the left lung base superimposed on areas of both attenuated bronchovascular opacity and increased interstitial opacity. Normal cardiac size and mediastinal contours. Visualized tracheal air column is within normal limits. No pneumothorax or pleural effusion identified.  IMPRESSION: Increased patchy opacity at the left lung base compatible with acute infectious exacerbation of chronic lung disease.   Electronically Signed   By: Odessa Fleming M.D.   On: 05/05/2014 10:18    Micro Results      Recent Results (from the past 240 hour(s))  Culture, blood (routine x 2)     Status: None (Preliminary result)   Collection Time: 05/05/14  9:50 AM  Result Value Ref Range Status   Specimen Description BLOOD BLOOD LEFT FOREARM  Final   Special Requests BOTTLES DRAWN AEROBIC AND ANAEROBIC 5CC  Final   Culture   Final           BLOOD CULTURE RECEIVED NO GROWTH TO DATE CULTURE WILL BE HELD FOR 5 DAYS BEFORE ISSUING A FINAL NEGATIVE REPORT Performed at [facility]    Report Status PENDING  Incomplete  Culture, blood (routine x 2)     Status: None (Preliminary result)   Collection Time: 05/05/14  4:05 PM  Result Value Ref Range Status   Specimen Description BLOOD RIGHT ARM  Final   Special Requests BOTTLES DRAWN AEROBIC ONLY 7CC  Final   Culture   Final           BLOOD CULTURE RECEIVED NO GROWTH TO DATE CULTURE WILL BE HELD FOR 5 DAYS BEFORE ISSUING A FINAL NEGATIVE REPORT Performed at [facility]    Report Status PENDING  Incomplete       Today   Subjective:   Coady Train today has  no headache,no chest abdominal pain,no new weakness tingling or numbness, feels much better wants to go home today.    Objective:   Blood pressure 149/84, pulse 90, temperature 98.3 F (36.8 C), temperature source Oral, resp. rate 20, height  (1.727 m), weight 81.33 kg (179 lb 4.8 oz), SpO2 93 %.   Intake/Output Summary (Last 24 hours) at 05/07/14 0841 Last data filed at 05/06/14 2200  Gross per 24 hour  Intake   1320 ml  Output    550 ml  Net    770 ml    Exam Awake Alert, Oriented x 3, No new F.N deficits, Normal affect [place].AT,PERRAL Supple Neck,No JVD, No cervical lymphadenopathy appriciated.  Symmetrical Chest wall movement, Good air movement bilaterally, CTAB RRR,No Gallops,Rubs or new Murmurs, No Parasternal Heave +ve B.Sounds, Abd Soft, Non tender, No organomegaly appriciated, No rebound -guarding or rigidity. No Cyanosis, Clubbing or edema, No new Rash or bruise  Data Review   CBC w Diff: Lab Results  Component Value Date   WBC 9.8 05/06/2014   HGB 11.9* 05/06/2014   HCT 38.4* 05/06/2014   PLT 269 05/06/2014   LYMPHOPCT 2* 05/05/2014   MONOPCT 4 05/05/2014   EOSPCT 0 05/05/2014   BASOPCT 0 05/05/2014    CMP: Lab Results  Component Value Date   NA 138 05/06/2014   K 3.5 05/06/2014   CL 94* 05/06/2014   CO2 35* 05/06/2014   BUN 29* 05/06/2014   CREATININE 1.07 05/06/2014   PROT 6.7 05/05/2014   ALBUMIN 3.5 05/05/2014   BILITOT 0.4 05/05/2014   ALKPHOS 69 05/05/2014   AST 39* 05/05/2014   ALT 34 05/05/2014  .   Total Time in preparing paper work, data evaluation and todays exam - 35 minutes  Leroy Sea M.D on 05/07/2014 at 8:41 AM  [facility]   Office  [phone]

## 2014-05-07 NOTE — Progress Notes (Signed)
Patient discharge teaching given, including activity, diet, follow-up appoints, and medications. Patient verbalized understanding of all discharge instructions. IV access was d/c'd. Vitals are stable. Skin is intact except as charted in most recent assessments. Pt to be escorted out by NT, to be driven home by family. 

## 2014-05-09 ENCOUNTER — Encounter (HOSPITAL_COMMUNITY): Payer: Self-pay | Admitting: *Deleted

## 2014-05-09 ENCOUNTER — Emergency Department (HOSPITAL_COMMUNITY): Payer: Medicare Other

## 2014-05-09 ENCOUNTER — Inpatient Hospital Stay (HOSPITAL_COMMUNITY)
Admission: EM | Admit: 2014-05-09 | Discharge: 2014-05-12 | DRG: 190 | Disposition: A | Discharge status: Home / Self Care | Payer: Medicare Other | Attending: Internal Medicine | Admitting: Internal Medicine

## 2014-05-09 DIAGNOSIS — K219 Gastro-esophageal reflux disease without esophagitis: Secondary | ICD-10-CM | POA: Diagnosis present

## 2014-05-09 DIAGNOSIS — Z86718 Personal history of other venous thrombosis and embolism: Secondary | ICD-10-CM | POA: Diagnosis not present

## 2014-05-09 DIAGNOSIS — Z87891 Personal history of nicotine dependence: Secondary | ICD-10-CM | POA: Diagnosis not present

## 2014-05-09 DIAGNOSIS — J9621 Acute and chronic respiratory failure with hypoxia: Secondary | ICD-10-CM | POA: Diagnosis present

## 2014-05-09 DIAGNOSIS — I10 Essential (primary) hypertension: Secondary | ICD-10-CM | POA: Diagnosis present

## 2014-05-09 DIAGNOSIS — G43909 Migraine, unspecified, not intractable, without status migrainosus: Secondary | ICD-10-CM | POA: Diagnosis present

## 2014-05-09 DIAGNOSIS — J441 Chronic obstructive pulmonary disease with (acute) exacerbation: Principal | ICD-10-CM

## 2014-05-09 DIAGNOSIS — R0602 Shortness of breath: Secondary | ICD-10-CM | POA: Diagnosis not present

## 2014-05-09 DIAGNOSIS — J101 Influenza due to other identified influenza virus with other respiratory manifestations: Secondary | ICD-10-CM | POA: Diagnosis present

## 2014-05-09 DIAGNOSIS — Z888 Allergy status to other drugs, medicaments and biological substances status: Secondary | ICD-10-CM | POA: Diagnosis not present

## 2014-05-09 DIAGNOSIS — E860 Dehydration: Secondary | ICD-10-CM | POA: Diagnosis present

## 2014-05-09 DIAGNOSIS — Z7982 Long term (current) use of aspirin: Secondary | ICD-10-CM | POA: Diagnosis not present

## 2014-05-09 DIAGNOSIS — J449 Chronic obstructive pulmonary disease, unspecified: Secondary | ICD-10-CM | POA: Insufficient documentation

## 2014-05-09 DIAGNOSIS — N179 Acute kidney failure, unspecified: Secondary | ICD-10-CM | POA: Diagnosis present

## 2014-05-09 DIAGNOSIS — R109 Unspecified abdominal pain: Secondary | ICD-10-CM

## 2014-05-09 DIAGNOSIS — Z9981 Dependence on supplemental oxygen: Secondary | ICD-10-CM

## 2014-05-09 DIAGNOSIS — Z8701 Personal history of pneumonia (recurrent): Secondary | ICD-10-CM

## 2014-05-09 DIAGNOSIS — E876 Hypokalemia: Secondary | ICD-10-CM | POA: Diagnosis present

## 2014-05-09 DIAGNOSIS — Z885 Allergy status to narcotic agent status: Secondary | ICD-10-CM

## 2014-05-09 DIAGNOSIS — F419 Anxiety disorder, unspecified: Secondary | ICD-10-CM | POA: Diagnosis present

## 2014-05-09 DIAGNOSIS — T501X5A Adverse effect of loop [high-ceiling] diuretics, initial encounter: Secondary | ICD-10-CM | POA: Diagnosis present

## 2014-05-09 DIAGNOSIS — R110 Nausea: Secondary | ICD-10-CM

## 2014-05-09 DIAGNOSIS — J189 Pneumonia, unspecified organism: Secondary | ICD-10-CM

## 2014-05-09 DIAGNOSIS — J45909 Unspecified asthma, uncomplicated: Secondary | ICD-10-CM | POA: Diagnosis present

## 2014-05-09 LAB — BASIC METABOLIC PANEL
Anion gap: 12 (ref 5–15)
BUN: 28 mg/dL — ABNORMAL HIGH (ref 6–23)
CALCIUM: 9.3 mg/dL (ref 8.4–10.5)
CO2: 34 mmol/L — ABNORMAL HIGH (ref 19–32)
Chloride: 93 mmol/L — ABNORMAL LOW (ref 96–112)
Creatinine, Ser: 1.2 mg/dL (ref 0.50–1.35)
GFR calc non Af Amer: 58 mL/min — ABNORMAL LOW (ref >90)
GFR, EST AFRICAN AMERICAN: 67 mL/min — AB (ref >90)
Glucose, Bld: 113 mg/dL — ABNORMAL HIGH (ref 70–99)
Potassium: 2.8 mmol/L — ABNORMAL LOW (ref 3.5–5.1)
SODIUM: 139 mmol/L (ref 135–145)

## 2014-05-09 LAB — CBC WITH DIFFERENTIAL/PLATELET
Basophils Absolute: 0.1 10*3/uL (ref 0.0–0.1)
Basophils Relative: 1 % (ref 0–1)
EOS ABS: 0.1 10*3/uL (ref 0.0–0.7)
Eosinophils Relative: 1 % (ref 0–5)
HCT: 40.4 % (ref 39.0–52.0)
HEMOGLOBIN: 13 g/dL (ref 13.0–17.0)
LYMPHS PCT: 15 % (ref 12–46)
Lymphs Abs: 1.5 10*3/uL (ref 0.7–4.0)
MCH: 30.4 pg (ref 26.0–34.0)
MCHC: 32.2 g/dL (ref 30.0–36.0)
MCV: 94.6 fL (ref 78.0–100.0)
Monocytes Absolute: 0.7 10*3/uL (ref 0.1–1.0)
Monocytes Relative: 7 % (ref 3–12)
NEUTROS ABS: 7.9 10*3/uL — AB (ref 1.7–7.7)
Neutrophils Relative %: 76 % (ref 43–77)
Platelets: 261 10*3/uL (ref 150–400)
RBC: 4.27 MIL/uL (ref 4.22–5.81)
RDW: 14.3 % (ref 11.5–15.5)
WBC: 10.3 10*3/uL (ref 4.0–10.5)

## 2014-05-09 LAB — I-STAT CG4 LACTIC ACID, ED: Lactic Acid, Venous: 1.25 mmol/L (ref 0.5–2.0)

## 2014-05-09 LAB — GLUCOSE, CAPILLARY
GLUCOSE-CAPILLARY: 116 mg/dL — AB (ref 70–99)
Glucose-Capillary: 168 mg/dL — ABNORMAL HIGH (ref 70–99)

## 2014-05-09 LAB — STREP PNEUMONIAE URINARY ANTIGEN: STREP PNEUMO URINARY ANTIGEN: NEGATIVE

## 2014-05-09 LAB — TROPONIN I

## 2014-05-09 MED ORDER — ALPRAZOLAM 0.5 MG PO TABS
0.5000 mg | ORAL_TABLET | Freq: Three times a day (TID) | ORAL | Status: DC | PRN
  Administered 2014-05-09 – 2014-05-12 (×8): 0.5 mg via ORAL
  Filled 2014-05-09 (×8): qty 1

## 2014-05-09 MED ORDER — GUAIFENESIN-DM 100-10 MG/5ML PO SYRP
5.0000 mL | ORAL_SOLUTION | ORAL | Status: DC | PRN
  Administered 2014-05-11 – 2014-05-12 (×4): 5 mL via ORAL
  Filled 2014-05-09 (×4): qty 5

## 2014-05-09 MED ORDER — ALBUTEROL SULFATE (2.5 MG/3ML) 0.083% IN NEBU
5.0000 mg | INHALATION_SOLUTION | Freq: Once | RESPIRATORY_TRACT | Status: AC
  Administered 2014-05-09: 5 mg via RESPIRATORY_TRACT
  Filled 2014-05-09 (×2): qty 6

## 2014-05-09 MED ORDER — OSELTAMIVIR PHOSPHATE 75 MG PO CAPS
75.0000 mg | ORAL_CAPSULE | Freq: Two times a day (BID) | ORAL | Status: AC
  Administered 2014-05-09 – 2014-05-12 (×6): 75 mg via ORAL
  Filled 2014-05-09 (×6): qty 1

## 2014-05-09 MED ORDER — ALBUTEROL SULFATE (2.5 MG/3ML) 0.083% IN NEBU
5.0000 mg | INHALATION_SOLUTION | Freq: Once | RESPIRATORY_TRACT | Status: AC
  Administered 2014-05-09: 5 mg via RESPIRATORY_TRACT
  Filled 2014-05-09: qty 6

## 2014-05-09 MED ORDER — IPRATROPIUM-ALBUTEROL 0.5-2.5 (3) MG/3ML IN SOLN
3.0000 mL | RESPIRATORY_TRACT | Status: DC
  Administered 2014-05-09 – 2014-05-10 (×5): 3 mL via RESPIRATORY_TRACT
  Filled 2014-05-09 (×5): qty 3

## 2014-05-09 MED ORDER — POTASSIUM CHLORIDE CRYS ER 20 MEQ PO TBCR
60.0000 meq | EXTENDED_RELEASE_TABLET | Freq: Four times a day (QID) | ORAL | Status: DC
Start: 2014-05-09 — End: 2014-05-09

## 2014-05-09 MED ORDER — VANCOMYCIN HCL IN DEXTROSE 750-5 MG/150ML-% IV SOLN
750.0000 mg | Freq: Two times a day (BID) | INTRAVENOUS | Status: DC
  Administered 2014-05-09 – 2014-05-11 (×4): 750 mg via INTRAVENOUS
  Filled 2014-05-09 (×5): qty 150

## 2014-05-09 MED ORDER — SODIUM CHLORIDE 0.9 % IV SOLN
INTRAVENOUS | Status: DC
  Administered 2014-05-09: 16:00:00 via INTRAVENOUS
  Administered 2014-05-10: 75 mL/h via INTRAVENOUS

## 2014-05-09 MED ORDER — SODIUM CHLORIDE 0.9 % IV BOLUS (SEPSIS)
500.0000 mL | Freq: Once | INTRAVENOUS | Status: AC
  Administered 2014-05-09: 500 mL via INTRAVENOUS

## 2014-05-09 MED ORDER — POTASSIUM CHLORIDE CRYS ER 20 MEQ PO TBCR
60.0000 meq | EXTENDED_RELEASE_TABLET | Freq: Four times a day (QID) | ORAL | Status: AC
Start: 2014-05-09 — End: 2014-05-09
  Administered 2014-05-09 (×2): 60 meq via ORAL
  Filled 2014-05-09 (×2): qty 3

## 2014-05-09 MED ORDER — IPRATROPIUM BROMIDE 0.02 % IN SOLN
0.5000 mg | Freq: Once | RESPIRATORY_TRACT | Status: AC
  Administered 2014-05-09: 0.5 mg via RESPIRATORY_TRACT
  Filled 2014-05-09 (×2): qty 2.5

## 2014-05-09 MED ORDER — DEXTROSE 5 % IV SOLN
1.0000 g | Freq: Three times a day (TID) | INTRAVENOUS | Status: DC
  Administered 2014-05-09 – 2014-05-11 (×6): 1 g via INTRAVENOUS
  Filled 2014-05-09 (×8): qty 1

## 2014-05-09 MED ORDER — PANTOPRAZOLE SODIUM 40 MG PO TBEC
40.0000 mg | DELAYED_RELEASE_TABLET | Freq: Two times a day (BID) | ORAL | Status: DC
  Administered 2014-05-09 – 2014-05-12 (×6): 40 mg via ORAL
  Filled 2014-05-09 (×6): qty 1

## 2014-05-09 MED ORDER — GUAIFENESIN ER 600 MG PO TB12
1200.0000 mg | ORAL_TABLET | Freq: Two times a day (BID) | ORAL | Status: DC
  Administered 2014-05-09 – 2014-05-12 (×6): 1200 mg via ORAL
  Filled 2014-05-09 (×7): qty 2

## 2014-05-09 MED ORDER — METHYLPREDNISOLONE SODIUM SUCC 125 MG IJ SOLR
80.0000 mg | Freq: Three times a day (TID) | INTRAMUSCULAR | Status: DC
  Administered 2014-05-09 – 2014-05-11 (×6): 80 mg via INTRAVENOUS
  Filled 2014-05-09 (×9): qty 1.28

## 2014-05-09 MED ORDER — HEPARIN SODIUM (PORCINE) 5000 UNIT/ML IJ SOLN
5000.0000 [IU] | Freq: Three times a day (TID) | INTRAMUSCULAR | Status: DC
  Administered 2014-05-09 – 2014-05-12 (×9): 5000 [IU] via SUBCUTANEOUS
  Filled 2014-05-09 (×12): qty 1

## 2014-05-09 MED ORDER — MAGNESIUM SULFATE 2 GM/50ML IV SOLN
2.0000 g | Freq: Once | INTRAVENOUS | Status: AC
  Administered 2014-05-09: 2 g via INTRAVENOUS
  Filled 2014-05-09: qty 50

## 2014-05-09 NOTE — H&P (Signed)
[facility] History and Physical  [name] [name]. ZOX:[id number] DOB: 1940/10/24 DOA: 05/09/2014  Referring physician: EDP PCP: [name], NP   Chief Complaint: SOB  HPI: [name] [name]. is a 74 y.o. male with past medical history of COPD, chronic hypoxic respiratory failure on 2 L of oxygen at home, he was discharged from the hospital on 05/05/2014 on Tamiflu and azithromycin for treatment of H1N1 infection and CAP. After discharge patient said he was still having shortness of breath, cough, sputum production, wheezing and he couldn't get his oxygen saturation above 90%, as patient worsening he showed up back in the ED today for further evaluation. In ED patient was found to have temperature of 100, respiratory rate of 31 and sats of 89% on 2 L of oxygen. Patient admitted to the hospital for further evaluation. Also was found to have mild dehydration.  Review of Systems:  Constitutional: negative for anorexia, fevers and sweats Eyes: negative for irritation, redness and visual disturbance Ears, nose, mouth, throat, and face: negative for earaches, epistaxis, nasal congestion and sore throat Respiratory: Per HPI Cardiovascular: negative for chest pain, dyspnea, lower extremity edema, orthopnea, palpitations and syncope Gastrointestinal: negative for abdominal pain, constipation, diarrhea, melena, nausea and vomiting Genitourinary:negative for dysuria, frequency and hematuria Hematologic/lymphatic: negative for bleeding, easy bruising and lymphadenopathy Musculoskeletal:negative for arthralgias, muscle weakness and stiff joints Neurological: negative for coordination problems, gait problems, headaches and weakness Endocrine: negative for diabetic symptoms including polydipsia, polyuria and weight loss Allergic/Immunologic: negative for anaphylaxis, hay fever and urticaria  Past Medical History  Diagnosis Date  . COPD (chronic obstructive pulmonary disease)   . Chronic  bronchitis     "get it q yr" (07/17/2013)  . Hypertension   . DVT (deep venous thrombosis) 1990's    "?LLE"  . On home oxygen therapy     "2L 24/7 since Monday" (07/17/2013)  . GERD (gastroesophageal reflux disease)     "related to RX I take" (07/17/2013)  . Daily headache   . Migraine     "all my life; no really bad migraines in 8-9 since work stress is gone" (07/17/2013)  . Pneumonia     "several times"  . Acute respiratory failure with hypoxia 01/2014   Past Surgical History  Procedure Laterality Date  . Tonsillectomy  1949  . Inguinal hernia repair Left ~ 1990   Social History:   reports that he quit smoking about 16 years ago. His smoking use included Cigarettes. He has a 10 pack-year smoking history. He has never used smokeless tobacco. He reports that he does not drink alcohol or use illicit drugs.  Allergies  Allergen Reactions  . Brovana [Arformoterol]     Ineffective--exacerbated coughing/congestion  . Codeine Other (See Comments)    Hallucinations   . Other Swelling    Bicillin injection in 1961--swelling in lower extremities and joints, and whelps up arm.  Has taken penicillin since w/o reaction.    Family History  Problem Relation Age of Onset  . Breast cancer Mother   . Alzheimer's disease Father     Prior to Admission medications   Medication Sig Start Date End Date Taking? Authorizing Provider  albuterol (ACCUNEB) 0.63 MG/3ML nebulizer solution Take 3 mLs by nebulization every 4 (four) hours as needed. 03/23/14  Yes Historical Provider, MD  albuterol (PROVENTIL HFA;VENTOLIN HFA) 108 (90 BASE) MCG/ACT inhaler Inhale 2 puffs into the lungs every 6 (six) hours as needed for wheezing or shortness of breath. 04/13/14  Yes Charlcie Cradle  Delford Field, MD  ALPRAZolam Prudy Feeler) 0.5 MG tablet Take 0.5 mg by mouth 3 (three) times daily as needed for anxiety.  04/06/14  Yes Historical Provider, MD  Aspirin-Caffeine (BAYER BACK & BODY PAIN EX ST) 500-32.5 MG TABS Take 2 tablets by mouth 2  (two) times daily as needed.    Yes Historical Provider, MD  azithromycin (ZITHROMAX) 500 MG tablet Take 1 tablet (500 mg total) by mouth daily. 05/07/14  Yes Leroy Sea, MD  furosemide (LASIX) 40 MG tablet Take 20 mg by mouth daily.   Yes Historical Provider, MD  mometasone-formoterol (DULERA) 200-5 MCG/ACT AERO Inhale 2 puffs into the lungs 2 (two) times daily.   Yes Historical Provider, MD  oseltamivir (TAMIFLU) 75 MG capsule Take 1 capsule (75 mg total) by mouth 2 (two) times daily. 05/07/14  Yes Leroy Sea, MD  pantoprazole (PROTONIX) 40 MG tablet Take 1 tablet (40 mg total) by mouth 2 (two) times daily. 11/01/13  Yes Vassie Loll, MD  sodium chloride (OCEAN) 0.65 % SOLN nasal spray Place 1 spray into the nose as needed for congestion (Dry nasal passages).   Yes Historical Provider, MD  SPIRIVA HANDIHALER 18 MCG inhalation capsule PLACE 1 CAPSULE (18 MCG TOTAL) INTO INHALER AND INHALE DAILY. 11/12/13  Yes Storm Frisk, MD   Physical Exam: Filed Vitals:   05/09/14 1200  BP: 134/76  Pulse: 94  Temp:   Resp: 23   Constitutional: Oriented to person, place, and time. Well-developed and well-nourished. Cooperative.  Head: Normocephalic and atraumatic.  Nose: Nose normal.  Mouth/Throat: Uvula is midline, oropharynx is clear and moist and mucous membranes are normal.  Eyes: Conjunctivae and EOM are normal. Pupils are equal, round, and reactive to light.  Neck: Trachea normal and normal range of motion. Neck supple.  Cardiovascular: Normal rate, regular rhythm, S1 normal, S2 normal, normal heart sounds and intact distal pulses.   Pulmonary/Chest: Minimal air movement, faint expiratory wheezing. Abdominal: Soft. Bowel sounds are normal. There is no hepatosplenomegaly. There is no tenderness.  Musculoskeletal: Normal range of motion.  Neurological: Alert and oriented to person, place, and time. Has normal strength. No cranial nerve deficit or sensory deficit.  Skin: Skin is warm,  dry and intact.  Psychiatric: Has a normal mood and affect. Speech is normal and behavior is normal.   Labs on Admission:  Basic Metabolic Panel:  Recent Labs Lab 05/05/14 0950 05/06/14 0505 05/09/14 0940  NA 138 138 139  K 3.2* 3.5 2.8*  CL 94* 94* 93*  CO2 32 35* 34*  GLUCOSE 153* 161* 113*  BUN 32* 29* 28*  CREATININE 1.35 1.07 1.20  CALCIUM 9.6 9.4 9.3   Liver Function Tests:  Recent Labs Lab 05/05/14 0950  AST 39*  ALT 34  ALKPHOS 69  BILITOT 0.4  PROT 6.7  ALBUMIN 3.5   No results for input(s): LIPASE, AMYLASE in the last 168 hours. No results for input(s): AMMONIA in the last 168 hours. CBC:  Recent Labs Lab 05/05/14 0950 05/06/14 0505 05/09/14 0940  WBC 16.5* 9.8 10.3  NEUTROABS 15.4*  --  7.9*  HGB 12.6* 11.9* 13.0  HCT 40.7 38.4* 40.4  MCV 98.3 97.7 94.6  PLT 260 269 261   Cardiac Enzymes:  Recent Labs Lab 05/09/14 0940  TROPONINI <0.03    BNP (last 3 results)  Recent Labs  05/05/14 0950  BNP 30.4    ProBNP (last 3 results)  Recent Labs  07/17/13 1110 08/06/13 1220  PROBNP 296.9* 103.4  CBG: No results for input(s): GLUCAP in the last 168 hours.  Radiological Exams on Admission: Dg Chest 2 View  05/09/2014   CLINICAL DATA:  Short of breath and chest pressure for 4 days. Hypertension. COPD. History of pneumonia.  EXAM: CHEST  2 VIEW  COMPARISON:  05/05/2014 and 11/12/2013  FINDINGS: Hyperinflation. Midline trachea. Normal heart size and mediastinal contours. Mild right hemidiaphragm elevation. No pleural effusion or pneumothorax. Advanced lower lobe predominant interstitial thickening with right upper lobe scarring. Similar left base opacity.  IMPRESSION: Marked COPD/chronic bronchitis.  Similar left base opacity. This is increased since 11/12/2013. Considerations include evolving scar or residual infection.   Electronically Signed   By: Jeronimo GreavesKyle  Talbot M.D.   On: 05/09/2014 12:04    EKG: Independently reviewed. 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 Acute on chronic respiratory failure likely secondary to COPD exacerbation and recent flu bronchitis. Patient has respiratory distress, improved after increasing the oxygen and breathing treatment. Continue to provide higher rate of oxygen, currently on 3-4 L, sats in the low 90s.  Acute COPD exacerbation SOB, hypoxia, sputum production and wheezing. Currently on Tamiflu and azithromycin. Antibiotics switched to cefepime and vancomycin, continue Tamiflu for 3 more days. Started on Solu-Medrol, Mucinex, DuoNeb and oxygen.  Influenza A infection Influenza A subtype H1N1 infection, patient is on Tamiflu, continue for 3 more days.  Acute kidney injury/dehydration Patient is on Lasix, discontinue, likely secondary to infection and increased insensible loss from fever. Started on slow rate of IV fluids, check creatinine/BUN in a.m.  Hypokalemia Potassium is 2.8, Likely secondary to Lasix, replete with oral supplements, check BMP in a.m.  Code Status: Full code Family Communication: Plan discussed with the patient Disposition Plan: Telemetry, inpatient  Time spent: 70 minutes  @[facility] @ [facility] Pager [phone]

## 2014-05-09 NOTE — Progress Notes (Signed)
ANTIBIOTIC CONSULT NOTE - INITIAL  Pharmacy Consult for Vancomycin/Cefepime Indication: pneumonia  Allergies  Allergen Reactions  . Brovana [Arformoterol]     Ineffective--exacerbated coughing/congestion  . Codeine Other (See Comments)    Hallucinations   . Other Swelling    Bicillin injection in 1961--swelling in lower extremities and joints, and whelps up arm.  Has taken penicillin since w/o reaction.    Patient Measurements:   Adjusted Body Weight:    Vital Signs: Temp: 97.9 F (36.6 C) (04/02 1507) Temp Source: Oral (04/02 1507) BP: 129/76 mmHg (04/02 1507) Pulse Rate: 99 (04/02 1507) Intake/Output from previous day:   Intake/Output from this shift:    Labs:  Recent Labs  05/09/14 0940  WBC 10.3  HGB 13.0  PLT 261  CREATININE 1.20   Estimated Creatinine Clearance: 53 mL/min (by C-G formula based on Cr of 1.2). No results for input(s): VANCOTROUGH, VANCOPEAK, VANCORANDOM, GENTTROUGH, GENTPEAK, GENTRANDOM, TOBRATROUGH, TOBRAPEAK, TOBRARND, AMIKACINPEAK, AMIKACINTROU, AMIKACIN in the last 72 hours.   Microbiology: Recent Results (from the past 720 hour(s))  Culture, blood (routine x 2)     Status: None (Preliminary result)   Collection Time: 05/05/14  9:50 AM  Result Value Ref Range Status   Specimen Description BLOOD BLOOD LEFT FOREARM  Final   Special Requests BOTTLES DRAWN AEROBIC AND ANAEROBIC 5CC  Final   Culture   Final           BLOOD CULTURE RECEIVED NO GROWTH TO DATE CULTURE WILL BE HELD FOR 5 DAYS BEFORE ISSUING A FINAL NEGATIVE REPORT Performed at [facility]    Report Status PENDING  Incomplete  Culture, blood (routine x 2)     Status: None (Preliminary result)   Collection Time: 05/05/14  4:05 PM  Result Value Ref Range Status   Specimen Description BLOOD RIGHT ARM  Final   Special Requests BOTTLES DRAWN AEROBIC ONLY 7CC  Final   Culture   Final           BLOOD CULTURE RECEIVED NO GROWTH TO DATE CULTURE WILL BE HELD FOR 5 DAYS BEFORE  ISSUING A FINAL NEGATIVE REPORT Performed at [facility]    Report Status PENDING  Incomplete    Medical History: Past Medical History  Diagnosis Date  . COPD (chronic obstructive pulmonary disease)   . Chronic bronchitis     "get it q yr" (07/17/2013)  . Hypertension   . DVT (deep venous thrombosis) 1990's    "?LLE"  . On home oxygen therapy     "2L 24/7 since Monday" (07/17/2013)  . GERD (gastroesophageal reflux disease)     "related to RX I take" (07/17/2013)  . Daily headache   . Migraine     "all my life; no really bad migraines in 8-9 since work stress is gone" (07/17/2013)  . Pneumonia     "several times"  . Acute respiratory failure with hypoxia 01/2014    Medications:  Prescriptions prior to admission  Medication Sig Dispense Refill Last Dose  . albuterol (ACCUNEB) 0.63 MG/3ML nebulizer solution Take 3 mLs by nebulization every 4 (four) hours as needed.  2 05/09/2014 at Unknown time  . albuterol (PROVENTIL HFA;VENTOLIN HFA) 108 (90 BASE) MCG/ACT inhaler Inhale 2 puffs into the lungs every 6 (six) hours as needed for wheezing or shortness of breath. 18 g 4 05/08/2014 at Unknown time  . ALPRAZolam (XANAX) 0.5 MG tablet Take 0.5 mg by mouth 3 (three) times daily as needed for anxiety.   2 05/08/2014  at Unknown time  . Aspirin-Caffeine (BAYER BACK & BODY PAIN EX ST) 500-32.5 MG TABS Take 2 tablets by mouth 2 (two) times daily as needed.    05/08/2014 at Unknown time  . azithromycin (ZITHROMAX) 500 MG tablet Take 1 tablet (500 mg total) by mouth daily. 3 tablet 0 05/09/2014 at Unknown time  . furosemide (LASIX) 40 MG tablet Take 20 mg by mouth daily.   05/09/2014 at Unknown time  . mometasone-formoterol (DULERA) 200-5 MCG/ACT AERO Inhale 2 puffs into the lungs 2 (two) times daily.   05/08/2014 at Unknown time  . oseltamivir (TAMIFLU) 75 MG capsule Take 1 capsule (75 mg total) by mouth 2 (two) times daily. 8 capsule 0 05/09/2014 at Unknown time  . pantoprazole (PROTONIX) 40 MG tablet  Take 1 tablet (40 mg total) by mouth 2 (two) times daily. 60 tablet 1 05/09/2014 at Unknown time  . sodium chloride (OCEAN) 0.65 % SOLN nasal spray Place 1 spray into the nose as needed for congestion (Dry nasal passages).   unkn  . SPIRIVA HANDIHALER 18 MCG inhalation capsule PLACE 1 CAPSULE (18 MCG TOTAL) INTO INHALER AND INHALE DAILY. 30 capsule 11 05/08/2014 at Unknown time   Assessment: SOB 74 y/o M with h/o COPD on home O2 recently discharge 3/29 (for PNA +flu) re-presents with SOB, sputum production, wheezing, low O2 sats at home. Labs: K=2.8. Scr 1.2. WBC 10.3. CrCl 53   Goal of Therapy:  Vancomycin trough level 15-20 mcg/ml  Plan:  Tamiflu x 3 more days Cefepime 1g IV q8hr x 8d dose ok Vancomycin 750mg  IV q12h. Trough after 3-5 doses at steady state.    Allyce Bochicchio S. Merilynn Finlandobertson, PharmD, BCPS Clinical Staff Pharmacist Pager [phone]  Misty Stanleyobertson, [place] Figg Stillinger 05/09/2014,3:34 PM

## 2014-05-09 NOTE — ED Notes (Addendum)
Pt reports recent hospital admission and was told he had pneumonia and flu. Pt still having sob despite being on home o2. spo2 90% at triage. Mask on pt at triage.

## 2014-05-09 NOTE — ED Provider Notes (Signed)
CSN: [id number]     Arrival date & time 05/09/14  0847 History   First MD Initiated Contact with Patient 05/09/14 [phone]     Chief Complaint  Patient presents with  . Shortness of Breath     (Consider location/radiation/quality/duration/timing/severity/associated sxs/prior Treatment) Patient is a 74 y.o. male presenting with shortness of breath. The history is provided by the patient.  Shortness of Breath Associated symptoms: cough   Associated symptoms: no abdominal pain, no chest pain, no headaches, no rash and no vomiting    patient was discharged from the hospital 2 days ago with pneumonia and the flu. He is on chronic oxygen therapy. Patient states he was feeling well when he went home about a time at home and his sats were in 27s. His normal saturation is around 93%. He's had some nasal congestion. States he's had a cough with minimal production now. No fevers or chills. No swelling in his legs.  Past Medical History  Diagnosis Date  . COPD (chronic obstructive pulmonary disease)   . Chronic bronchitis     "get it q yr" (07/17/2013)  . Hypertension   . DVT (deep venous thrombosis) 1990's    "?LLE"  . On home oxygen therapy     "2L 24/7 since Monday" (07/17/2013)  . GERD (gastroesophageal reflux disease)     "related to RX I take" (07/17/2013)  . Daily headache   . Migraine     "all my life; no really bad migraines in 8-9 since work stress is gone" (07/17/2013)  . Pneumonia     "several times"  . Acute respiratory failure with hypoxia 01/2014   Past Surgical History  Procedure Laterality Date  . Tonsillectomy  1949  . Inguinal hernia repair Left ~ 1990   Family History  Problem Relation Age of Onset  . Breast cancer Mother   . Alzheimer's disease Father    History  Substance Use Topics  . Smoking status: Former Smoker -- 1.00 packs/day for 10 years    Types: Cigarettes    Quit date: 02/06/1998  . Smokeless tobacco: Never Used  . Alcohol Use: No    Review of Systems   Constitutional: Negative for activity change and appetite change.  HENT: Positive for congestion.   Eyes: Negative for pain.  Respiratory: Positive for cough and shortness of breath. Negative for chest tightness.   Cardiovascular: Negative for chest pain and leg swelling.  Gastrointestinal: Negative for nausea, vomiting, abdominal pain and diarrhea.  Genitourinary: Negative for flank pain.  Musculoskeletal: Negative for back pain and neck stiffness.  Skin: Negative for rash.  Neurological: Negative for weakness, numbness and headaches.  Psychiatric/Behavioral: Negative for behavioral problems.      Allergies  Brovana; Codeine; and Other  Home Medications   Prior to Admission medications   Medication Sig Start Date End Date Taking? Authorizing Provider  albuterol (ACCUNEB) 0.63 MG/3ML nebulizer solution Take 3 mLs by nebulization every 4 (four) hours as needed. 03/23/14  Yes Historical Provider, MD  albuterol (PROVENTIL HFA;VENTOLIN HFA) 108 (90 BASE) MCG/ACT inhaler Inhale 2 puffs into the lungs every 6 (six) hours as needed for wheezing or shortness of breath. 04/13/14  Yes Storm Frisk, MD  ALPRAZolam Prudy Feeler) 0.5 MG tablet Take 0.5 mg by mouth 3 (three) times daily as needed for anxiety.  04/06/14  Yes Historical Provider, MD  Aspirin-Caffeine (BAYER BACK & BODY PAIN EX ST) 500-32.5 MG TABS Take 2 tablets by mouth 2 (two) times daily as needed.  Yes Historical Provider, MD  azithromycin (ZITHROMAX) 500 MG tablet Take 1 tablet (500 mg total) by mouth daily. 05/07/14  Yes Leroy Sea, MD  furosemide (LASIX) 40 MG tablet Take 20 mg by mouth daily.   Yes Historical Provider, MD  mometasone-formoterol (DULERA) 200-5 MCG/ACT AERO Inhale 2 puffs into the lungs 2 (two) times daily.   Yes Historical Provider, MD  oseltamivir (TAMIFLU) 75 MG capsule Take 1 capsule (75 mg total) by mouth 2 (two) times daily. 05/07/14  Yes Leroy Sea, MD  pantoprazole (PROTONIX) 40 MG tablet Take 1  tablet (40 mg total) by mouth 2 (two) times daily. 11/01/13  Yes Vassie Loll, MD  sodium chloride (OCEAN) 0.65 % SOLN nasal spray Place 1 spray into the nose as needed for congestion (Dry nasal passages).   Yes Historical Provider, MD  SPIRIVA HANDIHALER 18 MCG inhalation capsule PLACE 1 CAPSULE (18 MCG TOTAL) INTO INHALER AND INHALE DAILY. 11/12/13  Yes Storm Frisk, MD   BP 129/76 mmHg  Pulse 99  Temp(Src) 97.9 F (36.6 C) (Oral)  Resp 20  SpO2 93% Physical Exam  Constitutional: He is oriented to person, place, and time. He appears well-developed and well-nourished.  HENT:  Head: Normocephalic and atraumatic.  Eyes: Pupils are equal, round, and reactive to light.  Neck: Normal range of motion.  Cardiovascular: Regular rhythm and normal heart sounds.   No murmur heard. Mild tachycardia  Pulmonary/Chest:  Diffuse mildly prolonged expirations.  Abdominal: Soft. Bowel sounds are normal. He exhibits no distension.  Musculoskeletal: Normal range of motion. He exhibits no edema.  Neurological: He is alert and oriented to person, place, and time. No cranial nerve deficit.  Skin: Skin is warm and dry.  Psychiatric: He has a normal mood and affect.  Nursing note and vitals reviewed.   ED Course  Procedures (including critical care time) Labs Review Labs Reviewed  CBC WITH DIFFERENTIAL/PLATELET - Abnormal; Notable for the following:    Neutro Abs 7.9 (*)    All other components within normal limits  BASIC METABOLIC PANEL - Abnormal; Notable for the following:    Potassium 2.8 (*)    Chloride 93 (*)    CO2 34 (*)    Glucose, Bld 113 (*)    BUN 28 (*)    GFR calc non Af Amer 58 (*)    GFR calc Af Amer 67 (*)    All other components within normal limits  CULTURE, BLOOD (ROUTINE X 2)  CULTURE, BLOOD (ROUTINE X 2)  CULTURE, EXPECTORATED SPUTUM-ASSESSMENT  GRAM STAIN  TROPONIN I  LEGIONELLA ANTIGEN, URINE  STREP PNEUMONIAE URINARY ANTIGEN  I-STAT CG4 LACTIC ACID, ED     Imaging Review Dg Chest 2 View  05/09/2014   CLINICAL DATA:  Short of breath and chest pressure for 4 days. Hypertension. COPD. History of pneumonia.  EXAM: CHEST  2 VIEW  COMPARISON:  05/05/2014 and 11/12/2013  FINDINGS: Hyperinflation. Midline trachea. Normal heart size and mediastinal contours. Mild right hemidiaphragm elevation. No pleural effusion or pneumothorax. Advanced lower lobe predominant interstitial thickening with right upper lobe scarring. Similar left base opacity.  IMPRESSION: Marked COPD/chronic bronchitis.  Similar left base opacity. This is increased since 11/12/2013. Considerations include evolving scar or residual infection.   Electronically Signed   By: Jeronimo Greaves M.D.   On: 05/09/2014 12:04     EKG Interpretation   Date/Time:  Saturday May 09 2014 09:02:55 EDT Ventricular Rate:  106 PR Interval:  140 QRS Duration: 112  QT Interval:  376 QTC Calculation: 499 R Axis:   72 Text Interpretation:  Sinus tachycardia Right atrial enlargement  Incomplete right bundle branch block Septal infarct , age undetermined  Confirmed by Allisen Pidgeon  MD, Bartley Vuolo [phone]) on 05/09/2014 9:23:09 AM      MDM   Final diagnoses:  COPD with acute exacerbation    Patient with shortness of breath. History of same and recent admission for pneumonia. Hypoxic on his baseline oxygen. Will admit back to hospitals.    Benjiman CoreNathan Maty Zeisler, MD 05/09/14 [phone]

## 2014-05-10 ENCOUNTER — Inpatient Hospital Stay (HOSPITAL_COMMUNITY): Payer: Medicare Other

## 2014-05-10 LAB — BASIC METABOLIC PANEL
ANION GAP: 11 (ref 5–15)
BUN: 19 mg/dL (ref 6–23)
CO2: 26 mmol/L (ref 19–32)
CREATININE: 0.88 mg/dL (ref 0.50–1.35)
Calcium: 9 mg/dL (ref 8.4–10.5)
Chloride: 99 mmol/L (ref 96–112)
GFR calc Af Amer: >90 mL/min (ref >90)
GFR, EST NON AFRICAN AMERICAN: 83 mL/min — AB (ref >90)
Glucose, Bld: 148 mg/dL — ABNORMAL HIGH (ref 70–99)
POTASSIUM: 4.3 mmol/L (ref 3.5–5.1)
SODIUM: 136 mmol/L (ref 135–145)

## 2014-05-10 LAB — MAGNESIUM
Magnesium: 2.1 mg/dL (ref 1.5–2.5)
Magnesium: 2.1 mg/dL (ref 1.5–2.5)

## 2014-05-10 MED ORDER — MOMETASONE FURO-FORMOTEROL FUM 200-5 MCG/ACT IN AERO
2.0000 | INHALATION_SPRAY | Freq: Two times a day (BID) | RESPIRATORY_TRACT | Status: DC
  Administered 2014-05-10 – 2014-05-12 (×4): 2 via RESPIRATORY_TRACT
  Filled 2014-05-10: qty 8.8

## 2014-05-10 MED ORDER — ALBUTEROL SULFATE (2.5 MG/3ML) 0.083% IN NEBU
2.5000 mg | INHALATION_SOLUTION | Freq: Four times a day (QID) | RESPIRATORY_TRACT | Status: DC | PRN
  Administered 2014-05-11: 2.5 mg via RESPIRATORY_TRACT
  Filled 2014-05-10: qty 3

## 2014-05-10 MED ORDER — TIOTROPIUM BROMIDE MONOHYDRATE 18 MCG IN CAPS
18.0000 ug | ORAL_CAPSULE | Freq: Every day | RESPIRATORY_TRACT | Status: DC
  Filled 2014-05-10: qty 5

## 2014-05-10 MED ORDER — RANITIDINE HCL 150 MG/10ML PO SYRP
150.0000 mg | ORAL_SOLUTION | Freq: Two times a day (BID) | ORAL | Status: DC
  Administered 2014-05-10 – 2014-05-12 (×5): 150 mg via ORAL
  Filled 2014-05-10 (×6): qty 10

## 2014-05-10 MED ORDER — ALBUTEROL SULFATE HFA 108 (90 BASE) MCG/ACT IN AERS: 2.0000 | INHALATION_SPRAY | Freq: Four times a day (QID) | RESPIRATORY_TRACT | Status: DC | PRN

## 2014-05-10 MED ORDER — TIOTROPIUM BROMIDE MONOHYDRATE 18 MCG IN CAPS: 18.0000 ug | ORAL_CAPSULE | Freq: Every day | RESPIRATORY_TRACT | Status: DC

## 2014-05-10 MED ORDER — ALUM & MAG HYDROXIDE-SIMETH 200-200-20 MG/5ML PO SUSP
30.0000 mL | Freq: Once | ORAL | Status: AC
  Administered 2014-05-10: 30 mL via ORAL
  Filled 2014-05-10: qty 30

## 2014-05-10 MED ORDER — IPRATROPIUM-ALBUTEROL 0.5-2.5 (3) MG/3ML IN SOLN: 3.0000 mL | Freq: Four times a day (QID) | RESPIRATORY_TRACT | Status: DC

## 2014-05-10 MED ORDER — IPRATROPIUM-ALBUTEROL 0.5-2.5 (3) MG/3ML IN SOLN
3.0000 mL | Freq: Four times a day (QID) | RESPIRATORY_TRACT | Status: DC | PRN
  Administered 2014-05-10 (×2): 3 mL via RESPIRATORY_TRACT
  Filled 2014-05-10 (×2): qty 3

## 2014-05-10 NOTE — Progress Notes (Signed)
UR Completed.  [phone]  

## 2014-05-10 NOTE — Progress Notes (Signed)
PROGRESS NOTE  [name] MorningEddie Breden Jr. [id number] DOB: Jul 25, 1940 DOA: 05/09/2014 PCP: Egbert GaribaldiMillsaps, KIMBERLY M, NP  HPI/Recap of past 24 hours:  Very anxious, denies sore throat, no nasal congestion, reported intermittent dry cough, though no cough noticed during encounter.  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 Acute on chronic respiratory failure likely secondary to COPD exacerbation and recent flu bronchitis. Presented with respiratory distress, improved after increasing the oxygen and breathing treatment. Continue nebs/steroids/abx/mucolytics/oxygen.  Ct chest to further eval pna?   Influenza A infection Influenza A subtype H1N1 infection, patient is on Tamiflu, continue for 3 more days.  Acute kidney injury/dehydration Patient is on Lasix, discontinue, likely secondary to infection and increased insensible loss from fever. Started on slow rate of IV fluids, check creatinine/BUN in a.m.  Hypokalemia Potassium is 2.8, Likely secondary to Lasix, replete with oral supplements, check BMP in a.m.  H/o DVT? Not on chronic anticoagulation, no extremity edema,  Anxiety: significant, frequent reassurance required.  DVT prophylaxis with subQ heparin  Consultants: none  Code Status: full  Family Communication:  patient  Disposition Plan: remain inpatient  Time spent: >8135mins   Procedures:  Ct chest  Antibiotics:  vanc/cefepime   Objective: BP 127/71 mmHg  Pulse 87  Temp(Src) 97.5 F (36.4 C) (Oral)  Resp 20  Ht 5\' 8"  (1.727 m)  Wt 81.3 kg (179 lb 3.7 oz)  BMI 27.26 kg/m2  SpO2 93%  Intake/Output Summary (Last 24 hours) at 05/10/14  1457 Last data filed at 05/10/14 0438  Gross per 24 hour  Intake    250 ml  Output    775 ml  Net   -525 ml   Filed Weights   05/09/14 2105  Weight: 81.3 kg (179 lb 3.7 oz)    Exam:   General:  NAD, anxious  Cardiovascular: RRR  Respiratory: fair airentry, ? Faint wheezes?  Abdomen: Soft/ND/NT, positive BS  Musculoskeletal: No Edema  Neuro: anxious, no focal deficit  Data Reviewed: Basic Metabolic Panel:  Recent Labs Lab 05/05/14 0950 05/06/14 0505 05/09/14 0940 05/10/14 0551  NA 138 138 139  --   K 3.2* 3.5 2.8*  --   CL 94* 94* 93*  --   CO2 32 35* 34*  --   GLUCOSE 153* 161* 113*  --   BUN 32* 29* 28*  --   CREATININE 1.35 1.07 1.20  --   CALCIUM 9.6 9.4 9.3  --   MG  --   --   --  2.1   Liver Function Tests:  Recent Labs Lab 05/05/14 0950  AST 39*  ALT 34  ALKPHOS 69  BILITOT 0.4  PROT 6.7  ALBUMIN 3.5   No results for input(s): LIPASE, AMYLASE in the last 168 hours. No results for input(s): AMMONIA in the last 168 hours. CBC:  Recent Labs Lab 05/05/14 0950 05/06/14 0505 05/09/14 0940  WBC 16.5* 9.8 10.3  NEUTROABS 15.4*  --  7.9*  HGB 12.6* 11.9* 13.0  HCT 40.7 38.4* 40.4  MCV 98.3 97.7 94.6  PLT 260 269 261   Cardiac Enzymes:    Recent Labs Lab 05/09/14 0940  TROPONINI <0.03   BNP (last 3  results)  Recent Labs  05/05/14 0950  BNP 30.4    ProBNP (last 3 results)  Recent Labs  07/17/13 1110 08/06/13 1220  PROBNP 296.9* 103.4    CBG:  Recent Labs Lab 05/09/14 1623 05/09/14 2108  GLUCAP 116* 168*    Recent Results (from the past 240 hour(s))  Culture, blood (routine x 2)     Status: None (Preliminary result)   Collection Time: 05/05/14  9:50 AM  Result Value Ref Range Status   Specimen Description BLOOD BLOOD LEFT FOREARM  Final   Special Requests BOTTLES DRAWN AEROBIC AND ANAEROBIC 5CC  Final   Culture   Final           BLOOD CULTURE RECEIVED NO GROWTH TO DATE CULTURE WILL BE HELD FOR 5 DAYS BEFORE  ISSUING A FINAL NEGATIVE REPORT Performed at [facility]    Report Status PENDING  Incomplete  Culture, blood (routine x 2)     Status: None (Preliminary result)   Collection Time: 05/05/14  4:05 PM  Result Value Ref Range Status   Specimen Description BLOOD RIGHT ARM  Final   Special Requests BOTTLES DRAWN AEROBIC ONLY 7CC  Final   Culture   Final           BLOOD CULTURE RECEIVED NO GROWTH TO DATE CULTURE WILL BE HELD FOR 5 DAYS BEFORE ISSUING A FINAL NEGATIVE REPORT Performed at [facility]    Report Status PENDING  Incomplete  Culture, blood (routine x 2) Call MD if unable to obtain prior to antibiotics being given     Status: None (Preliminary result)   Collection Time: 05/09/14  4:04 PM  Result Value Ref Range Status   Specimen Description BLOOD LEFT ARM  Final   Special Requests BOTTLES DRAWN AEROBIC AND ANAEROBIC 10CC  Final   Culture   Final           BLOOD CULTURE RECEIVED NO GROWTH TO DATE CULTURE WILL BE HELD FOR 5 DAYS BEFORE ISSUING A FINAL NEGATIVE REPORT Performed at [facility]    Report Status PENDING  Incomplete  Culture, blood (routine x 2) Call MD if unable to obtain prior to antibiotics being given     Status: None (Preliminary result)   Collection Time: 05/09/14  4:10 PM  Result Value Ref Range Status   Specimen Description BLOOD RIGHT ARM  Final   Special Requests BOTTLES DRAWN AEROBIC AND ANAEROBIC  10CC  Final   Culture   Final           BLOOD CULTURE RECEIVED NO GROWTH TO DATE CULTURE WILL BE HELD FOR 5 DAYS BEFORE ISSUING A FINAL NEGATIVE REPORT Performed at [facility]    Report Status PENDING  Incomplete     Studies: Dg Chest 2 View  05/09/2014   CLINICAL DATA:  Short of breath and chest pressure for 4 days. Hypertension. COPD. History of pneumonia.  EXAM: CHEST  2 VIEW  COMPARISON:  05/05/2014 and 11/12/2013  FINDINGS: Hyperinflation. Midline trachea. Normal heart size and mediastinal contours. Mild right  hemidiaphragm elevation. No pleural effusion or pneumothorax. Advanced lower lobe predominant interstitial thickening with right upper lobe scarring. Similar left base opacity.  IMPRESSION: Marked COPD/chronic bronchitis.  Similar left base opacity. This is increased since 11/12/2013. Considerations include evolving scar or residual infection.   Electronically Signed   By: Jeronimo Greaves M.D.   On: 05/09/2014 12:04    Scheduled Meds: . ceFEPime (MAXIPIME) IV  1 g Intravenous  Q8H  . guaiFENesin  1,200 mg Oral BID  . heparin  5,000 Units Subcutaneous 3 times per day  . methylPREDNISolone (SOLU-MEDROL) injection  80 mg Intravenous 3 times per day  . mometasone-formoterol  2 puff Inhalation BID  . oseltamivir  75 mg Oral BID  . pantoprazole  40 mg Oral BID  . ranitidine  150 mg Oral BID  . tiotropium  18 mcg Inhalation Daily  . vancomycin  750 mg Intravenous Q12H    Continuous Infusions:    Time spent: >79mins  Tc Kapusta MD, PhD  [facility] Pager [phone]. If 7PM-7AM, please contact night-coverage at [email or web address], password [facility] 05/10/2014, 2:57 PM  LOS: 1 day

## 2014-05-11 LAB — CULTURE, BLOOD (ROUTINE X 2)
CULTURE: NO GROWTH
Culture: NO GROWTH

## 2014-05-11 LAB — BASIC METABOLIC PANEL WITH GFR
Anion gap: 11 (ref 5–15)
BUN: 18 mg/dL (ref 6–23)
CO2: 26 mmol/L (ref 19–32)
Calcium: 8.7 mg/dL (ref 8.4–10.5)
Chloride: 98 mmol/L (ref 96–112)
Creatinine, Ser: 0.86 mg/dL (ref 0.50–1.35)
GFR calc Af Amer: >90 mL/min
GFR calc non Af Amer: 84 mL/min — ABNORMAL LOW
Glucose, Bld: 180 mg/dL — ABNORMAL HIGH (ref 70–99)
Potassium: 4.7 mmol/L (ref 3.5–5.1)
Sodium: 135 mmol/L (ref 135–145)

## 2014-05-11 LAB — LEGIONELLA ANTIGEN, URINE

## 2014-05-11 LAB — CBC
HCT: 35.9 % — ABNORMAL LOW (ref 39.0–52.0)
Hemoglobin: 11 g/dL — ABNORMAL LOW (ref 13.0–17.0)
MCH: 29.5 pg (ref 26.0–34.0)
MCHC: 30.6 g/dL (ref 30.0–36.0)
MCV: 96.2 fL (ref 78.0–100.0)
Platelets: 257 K/uL (ref 150–400)
RBC: 3.73 MIL/uL — ABNORMAL LOW (ref 4.22–5.81)
RDW: 14.4 % (ref 11.5–15.5)
WBC: 17.6 K/uL — ABNORMAL HIGH (ref 4.0–10.5)

## 2014-05-11 LAB — GLUCOSE, CAPILLARY: Glucose-Capillary: 151 mg/dL — ABNORMAL HIGH (ref 70–99)

## 2014-05-11 LAB — MAGNESIUM: Magnesium: 2.1 mg/dL (ref 1.5–2.5)

## 2014-05-11 MED ORDER — ALBUTEROL SULFATE (2.5 MG/3ML) 0.083% IN NEBU: 2.5000 mg | INHALATION_SOLUTION | RESPIRATORY_TRACT | Status: DC | PRN

## 2014-05-11 MED ORDER — MONTELUKAST SODIUM 4 MG PO CHEW: 4.0000 mg | CHEWABLE_TABLET | Freq: Every day | ORAL | Status: DC

## 2014-05-11 MED ORDER — IPRATROPIUM-ALBUTEROL 0.5-2.5 (3) MG/3ML IN SOLN
3.0000 mL | Freq: Four times a day (QID) | RESPIRATORY_TRACT | Status: DC
Start: 2014-05-11 — End: 2014-05-12
  Administered 2014-05-11 – 2014-05-12 (×6): 3 mL via RESPIRATORY_TRACT
  Filled 2014-05-11 (×6): qty 3

## 2014-05-11 MED ORDER — MONTELUKAST SODIUM 10 MG PO TABS
10.0000 mg | ORAL_TABLET | Freq: Every day | ORAL | Status: DC
  Administered 2014-05-11: 10 mg via ORAL
  Filled 2014-05-11 (×2): qty 1

## 2014-05-11 MED ORDER — DOXYCYCLINE HYCLATE 100 MG PO TABS
100.0000 mg | ORAL_TABLET | Freq: Two times a day (BID) | ORAL | Status: DC
  Administered 2014-05-11 – 2014-05-12 (×3): 100 mg via ORAL
  Filled 2014-05-11 (×4): qty 1

## 2014-05-11 MED ORDER — PREDNISONE 50 MG PO TABS
50.0000 mg | ORAL_TABLET | Freq: Two times a day (BID) | ORAL | Status: DC
  Administered 2014-05-11 – 2014-05-12 (×2): 50 mg via ORAL
  Filled 2014-05-11 (×4): qty 1

## 2014-05-11 NOTE — Progress Notes (Signed)
PROGRESS NOTE  [name] [name]. UJW:[id number] DOB: [birth year] DOA: 05/09/2014 PCP: [name], NP  HPI/Recap of past 24 hours:  Very anxious, noticed occasional intermittent dry cough,  Reported followed by [facility] cardiology, just had echo and stress test was done in march, test was fine.   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 Acute on chronic respiratory failure likely secondary to COPD exacerbation and recent flu bronchitis. Presented with respiratory distress, improved after increasing the oxygen and breathing treatment. Continue nebs/steroids/abx/mucolytics/oxygen.  Ct chest  Advance emphasema changes, bilateral lower lobe small infiltrate pna vs scarring. Lung exam, no wheezing, will change all meds to oral.taper steroids. Reported more symptom in spring, will add singulair. Will ambulate in am, if do well, will d/c in am.   Influenza A infection Influenza A subtype H1N1 infection, patient is on Tamiflu, finish treatment on 4/5.  Acute kidney injury/dehydration Patient is on Lasix, discontinue, likely secondary to infection and increased insensible loss from fever. S/p brief slow rate of IV fluids, cr improved  Hypokalemia Potassium is 2.8, Likely secondary to Lasix, replete with oral supplements,  k normalized.  H/o DVT? Not on chronic anticoagulation, no extremity edema,  Anxiety: significant, frequent reassurance required. Home health discussed , patient seem reluctant.  DVT prophylaxis with subQ heparin  Consultants: none  Code Status: full  Family Communication:  patient  Disposition Plan: remain inpatient  Time spent:  >31mins   Procedures:  Ct chest  Antibiotics:  Vanc/cefepime from admission to 4/4  Doxycycline from 4/4   Objective: BP 114/73 mmHg  Pulse 80  Temp(Src) 97.5 F (36.4 C) (Oral)  Resp 21  Ht  (1.727 m)  Wt 81.3 kg (179 lb 3.7 oz)  BMI 27.26 kg/m2  SpO2 96%  Intake/Output Summary (Last 24 hours) at 05/11/14 1006 Last data filed at 05/11/14 0921  Gross per 24 hour  Intake    710 ml  Output   1150 ml  Net   -440 ml   Filed Weights   05/09/14 2105  Weight: 81.3 kg (179 lb 3.7 oz)    Exam:   General:  NAD, anxious  Cardiovascular: RRR  Respiratory: fair airentry, ? Faint wheezes?  Abdomen: Soft/ND/NT, positive BS  Musculoskeletal: No Edema  Neuro: anxious, no focal deficit  Data Reviewed: Basic Metabolic Panel:  Recent Labs Lab 05/05/14 0950 05/06/14 0505 05/09/14 0940 05/10/14 0551 05/10/14 1415 05/11/14 0438  NA 138 138 139  --  136 135  K 3.2* 3.5 2.8*  --  4.3 4.7  CL 94* 94* 93*  --  99 98  CO2 32 35* 34*  --  26 26  GLUCOSE 153* 161* 113*  --  148* 180*  BUN 32* 29* 28*  --  19 18  CREATININE 1.35 1.07 1.20  --  0.88 0.86  CALCIUM 9.6 9.4 9.3  --  9.0 8.7  MG  --   --   --  2.1 2.1 2.1   Liver Function Tests:  Recent Labs Lab 05/05/14 0950  AST 39*  ALT 34  ALKPHOS 69  BILITOT 0.4  PROT 6.7  ALBUMIN 3.5   No results for input(s): LIPASE,  AMYLASE in the last 168 hours. No results for input(s): AMMONIA in the last 168 hours. CBC:  Recent Labs Lab 05/05/14 0950 05/06/14 0505 05/09/14 0940 05/11/14 0438  WBC 16.5* 9.8 10.3 17.6*  NEUTROABS 15.4*  --  7.9*  --   HGB 12.6* 11.9* 13.0 11.0*  HCT 40.7 38.4* 40.4 35.9*  MCV 98.3 97.7 94.6 96.2  PLT 260 269 261 257   Cardiac Enzymes:    Recent Labs Lab 05/09/14 0940  TROPONINI <0.03   BNP (last 3 results)  Recent Labs  05/05/14 0950  BNP 30.4    ProBNP (last 3 results)  Recent Labs  07/17/13 1110 08/06/13 1220  PROBNP 296.9* 103.4     CBG:  Recent Labs Lab 05/09/14 1623 05/09/14 2108  GLUCAP 116* 168*    Recent Results (from the past 240 hour(s))  Culture, blood (routine x 2)     Status: None   Collection Time: 05/05/14  9:50 AM  Result Value Ref Range Status   Specimen Description BLOOD BLOOD LEFT FOREARM  Final   Special Requests BOTTLES DRAWN AEROBIC AND ANAEROBIC 5CC  Final   Culture   Final    NO GROWTH 5 DAYS Performed at [facility]    Report Status 05/11/2014 FINAL  Final  Culture, blood (routine x 2)     Status: None   Collection Time: 05/05/14  4:05 PM  Result Value Ref Range Status   Specimen Description BLOOD RIGHT ARM  Final   Special Requests BOTTLES DRAWN AEROBIC ONLY 7CC  Final   Culture   Final    NO GROWTH 5 DAYS Performed at [facility]    Report Status 05/11/2014 FINAL  Final  Culture, blood (routine x 2) Call MD if unable to obtain prior to antibiotics being given     Status: None (Preliminary result)   Collection Time: 05/09/14  4:04 PM  Result Value Ref Range Status   Specimen Description BLOOD LEFT ARM  Final   Special Requests BOTTLES DRAWN AEROBIC AND ANAEROBIC 10CC  Final   Culture   Final           BLOOD CULTURE RECEIVED NO GROWTH TO DATE CULTURE WILL BE HELD FOR 5 DAYS BEFORE ISSUING A FINAL NEGATIVE REPORT Performed at [facility]    Report Status PENDING  Incomplete  Culture, blood (routine x 2) Call MD if unable to obtain prior to antibiotics being given     Status: None (Preliminary result)   Collection Time: 05/09/14  4:10 PM  Result Value Ref Range Status   Specimen Description BLOOD RIGHT ARM  Final   Special Requests BOTTLES DRAWN AEROBIC AND ANAEROBIC  10CC  Final   Culture   Final           BLOOD CULTURE RECEIVED NO GROWTH TO DATE CULTURE WILL BE HELD FOR 5 DAYS BEFORE ISSUING A FINAL NEGATIVE REPORT Performed at [facility]    Report Status PENDING  Incomplete     Studies: Dg Abd 1 View  05/10/2014   CLINICAL  DATA:  Nausea and abdominal pain.  EXAM: ABDOMEN - 1 VIEW  COMPARISON:  01/20/2014  FINDINGS: Single supine view of the abdomen and pelvis. Non-obstructive bowel gas pattern. Minimal distal gas. No abnormal abdominal calcifications. No appendicolith. Probable phleboliths in the pelvis. Mild right hemidiaphragm elevation.  IMPRESSION: No acute findings.   Electronically Signed   By: Jeronimo Greaves M.D.   On: 05/10/2014 14:42   Ct Chest  Wo Contrast  05/10/2014   CLINICAL DATA:  Recent hospitalization for pneumonia. History of COPD, hypertension and DVT. Initial encounter.  EXAM: CT CHEST WITHOUT CONTRAST  TECHNIQUE: Multidetector CT imaging of the chest was performed following the standard protocol without IV contrast.  COMPARISON:  Radiographs 05/09/2014 and 05/05/2014.  CT 07/18/2013.  FINDINGS: Mediastinum/Nodes: There are no enlarged mediastinal, hilar or axillary lymph nodes. Small mediastinal lymph nodes are stable. The thyroid gland, trachea and esophagus demonstrate no significant findings. The heart size is normal. There is no pericardial effusion.Diffuse coronary artery calcifications are noted. There is lesser atherosclerosis of the aorta and great vessels.  Lungs/Pleura: There is no pleural effusion. Again demonstrated are diffuse changes of advanced centrilobular emphysema. Chronic volume loss and scarring in the right middle lobe are stable. There is minimal new patchy airspace disease dependently in the right lower lobe. In the left lower lobe, there is patchy airspace disease or scarring which has progressed compared with the prior CT. No consolidation or dominant nodule identified.  Upper abdomen:  Unremarkable.  There is no adrenal mass.  Musculoskeletal/Chest wall: There is no chest wall mass or suspicious osseous finding. Inferior endplate compression fracture at T7 has minimally progressed from the prior CT. No new fractures demonstrated.  IMPRESSION: 1. Compared with the prior CT from 10 months  ago, there are new patchy airspace opacities at both lung bases which may reflect residual pneumonia or postinflammatory scarring. No consolidation or suspicious nodule demonstrated. 2. Advanced emphysema. 3. Atherosclerosis as described.   Electronically Signed   By: Carey BullocksWilliam  Veazey M.D.   On: 05/10/2014 18:33    Scheduled Meds: . ceFEPime (MAXIPIME) IV  1 g Intravenous Q8H  . guaiFENesin  1,200 mg Oral BID  . heparin  5,000 Units Subcutaneous 3 times per day  . ipratropium-albuterol  3 mL Nebulization QID  . methylPREDNISolone (SOLU-MEDROL) injection  80 mg Intravenous 3 times per day  . mometasone-formoterol  2 puff Inhalation BID  . montelukast  4 mg Oral QHS  . oseltamivir  75 mg Oral BID  . pantoprazole  40 mg Oral BID  . ranitidine  150 mg Oral BID  . vancomycin  750 mg Intravenous Q12H    Continuous Infusions:    Time spent: >435mins  Brealynn Contino MD, PhD  [facility] Pager [phone]. If 7PM-7AM, please contact night-coverage at [email or web address], password [email or web address] 05/11/2014, 10:06 AM  LOS: 2 days

## 2014-05-12 LAB — BASIC METABOLIC PANEL
Anion gap: 9 (ref 5–15)
BUN: 18 mg/dL (ref 6–23)
CHLORIDE: 99 mmol/L (ref 96–112)
CO2: 29 mmol/L (ref 19–32)
Calcium: 8.9 mg/dL (ref 8.4–10.5)
Creatinine, Ser: 0.82 mg/dL (ref 0.50–1.35)
GFR calc non Af Amer: 86 mL/min — ABNORMAL LOW (ref >90)
Glucose, Bld: 120 mg/dL — ABNORMAL HIGH (ref 70–99)
POTASSIUM: 3.9 mmol/L (ref 3.5–5.1)
Sodium: 137 mmol/L (ref 135–145)

## 2014-05-12 LAB — CBC
HCT: 36.8 % — ABNORMAL LOW (ref 39.0–52.0)
Hemoglobin: 11.3 g/dL — ABNORMAL LOW (ref 13.0–17.0)
MCH: 29.6 pg (ref 26.0–34.0)
MCHC: 30.7 g/dL (ref 30.0–36.0)
MCV: 96.3 fL (ref 78.0–100.0)
Platelets: 271 10*3/uL (ref 150–400)
RBC: 3.82 MIL/uL — AB (ref 4.22–5.81)
RDW: 14.6 % (ref 11.5–15.5)
WBC: 18.4 10*3/uL — AB (ref 4.0–10.5)

## 2014-05-12 MED ORDER — MONTELUKAST SODIUM 10 MG PO TABS: 10.0000 mg | ORAL_TABLET | Freq: Every day | ORAL | Status: DC

## 2014-05-12 MED ORDER — PREDNISONE (PAK) 10 MG PO TABS: ORAL_TABLET | Freq: Every day | ORAL | Status: DC

## 2014-05-12 MED ORDER — IPRATROPIUM-ALBUTEROL 0.5-2.5 (3) MG/3ML IN SOLN: 3.0000 mL | Freq: Four times a day (QID) | RESPIRATORY_TRACT | Status: AC | PRN

## 2014-05-12 MED ORDER — DOXYCYCLINE HYCLATE 100 MG PO TABS: 100.0000 mg | ORAL_TABLET | Freq: Two times a day (BID) | ORAL | Status: DC

## 2014-05-12 NOTE — Progress Notes (Signed)
Medicare Important Message given? YES  (If response is "NO", the following Medicare IM given date fields will be blank)  Date Medicare IM given: 05/12/14 Medicare IM given by:  Aristotelis Vilardi  

## 2014-05-12 NOTE — Discharge Summary (Signed)
Discharge Summary  [name] [name]. NWG:[id number] DOB: 1940/04/21  PCP: [name], NP  Admit date: 05/09/2014 Discharge date: 05/12/2014  Time spent: >55mins  Recommendations for Outpatient Follow-up:  1. F/u with PMD this week for hospital follow up, pmd to repeat bmp, monitor potassium level. 2. F/u with pumolnology in two weeks for copd management.  Discharge Diagnoses:  Active Hospital Problems   Diagnosis Date Noted  . COPD with acute exacerbation 05/05/2014  . Acute on chronic respiratory failure 05/09/2014  . Influenza A H1N1 infection 05/09/2014  . AKI (acute kidney injury) 05/09/2014  . Dehydration 05/09/2014  . 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    Discharge Condition: stable, continue home oxygen 2liter 24/7.  Diet recommendation: heart healthy  Filed Weights   05/09/14 2105  Weight: 81.3 kg (179 lb 3.7 oz)    History of present illness:  [name] [name]. is a 74 y.o. male with past medical history of COPD, chronic hypoxic respiratory failure on 2 L of oxygen at home, he was discharged from the hospital on 05/05/2014 on Tamiflu and azithromycin for treatment of H1N1 infection and CAP. After discharge patient said he was still having shortness of breath, cough, sputum production, wheezing and he couldn't get his oxygen saturation above 90%, as patient worsening he showed up back in the ED today for further evaluation. In ED patient was found to have temperature of 100, respiratory rate of 31 and sats of 89% on 2 L of oxygen. Patient admitted to the hospital for further evaluation. Also was found to have mild dehydration.    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 Acute on chronic respiratory failure likely secondary to COPD exacerbation and recent flu bronchitis. Presented with respiratory distress, improved after increasing the oxygen and breathing treatment. Continue nebs/steroids/abx/mucolytics/oxygen.  Ct chest Advance emphasema changes, bilateral lower lobe small infiltrate pna vs scarring. Lung exam, faint wheezes?  will change all meds to oral.taper steroids. Reported more symptom in spring, will add singulair. Reported feeling better on 4/5, d/c home. With meds adjustment as described in med rec.   Influenza A infection Influenza A subtype H1N1 infection, patient is on Tamiflu, finished treatment on 4/5.  Acute kidney injury/dehydration Patient is on Lasix, discontinue, likely secondary to infection and increased insensible loss from fever. S/p brief slow rate of IV fluids, cr improved  Hypokalemia Potassium is 2.8, Likely secondary to Lasix, replete with oral supplements, k normalized. pmd to repeat labs. Monitor k level.  H/o DVT? Not on chronic anticoagulation, no extremity edema,  Anxiety: significant, frequent reassurance required. Home health discussed , patient seems reluctant. 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   Consultants: none  Code Status: full  Family Communication: patient    Time spent: >34mins   Procedures:  Ct chest  Antibiotics:  Vanc/cefepime from admission to 4/4  Doxycycline from 4/4   Discharge Exam: BP 117/73 mmHg  Pulse 84  Temp(Src) 97.5 F (36.4 C) (Oral)  Resp 21  Ht  (1.727 m)  Wt 81.3 kg (179 lb 3.7 oz)  BMI 27.26 kg/m2  SpO2 90%   General: NAD, anxious  Cardiovascular: RRR  Respiratory: fair airentry, ? Faint wheezes? Overall  diminished.  Abdomen: Soft/ND/NT, positive BS  Musculoskeletal: No Edema  Neuro: anxious, no focal deficit    Discharge Instructions You were cared for by a hospitalist during your hospital stay. If you have any questions about your discharge medications or the care you received while you were in the hospital after you are discharged, you can call the unit and asked to speak with the hospitalist on call if the hospitalist that took care of you is not available. Once you are discharged, your primary care physician will handle any further medical issues. Please note that NO REFILLS for any discharge medications will be authorized once you are discharged, as it is imperative that you return to your primary care physician (or establish a relationship with a primary care physician if you do not have one) for your aftercare needs so that they can reassess your need for medications and monitor your lab values.      Discharge Instructions    Diet - low sodium heart healthy    Complete by:  As directed      Increase activity slowly    Complete by:  As directed             Medication List    STOP taking these medications        azithromycin 500 MG tablet  Commonly known as:  ZITHROMAX     oseltamivir 75 MG capsule  Commonly known as:  TAMIFLU      TAKE these medications        albuterol 0.63 MG/3ML nebulizer solution  Commonly known as:  ACCUNEB  Take 3 mLs by nebulization every 4 (four) hours as needed.     albuterol 108 (90 BASE) MCG/ACT inhaler  Commonly known as:  PROVENTIL HFA;VENTOLIN HFA  Inhale 2 puffs into the lungs every 6 (six) hours as needed for wheezing or shortness of breath.     ALPRAZolam 0.5 MG tablet  Commonly known as:  XANAX  Take 0.5 mg by mouth 3 (three) times daily as needed for anxiety.     BAYER BACK & BODY PAIN EX ST 500-32.5 MG Tabs  Generic drug:  Aspirin-Caffeine  Take 2 tablets by mouth 2 (two) times daily as needed.     doxycycline 100 MG  tablet  Commonly known as:  VIBRA-TABS  Take 1 tablet (100 mg total) by mouth every 12 (twelve) hours.     furosemide 40 MG tablet  Commonly known as:  LASIX  Take 20 mg by mouth daily.     ipratropium-albuterol 0.5-2.5 (3) MG/3ML Soln  Commonly known as:  DUONEB  Take 3 mLs by nebulization every 6 (six) hours as needed.     mometasone-formoterol 200-5 MCG/ACT Aero  Commonly known as:  DULERA  Inhale 2 puffs into the lungs 2 (two) times daily.     montelukast 10 MG tablet  Commonly known as:  SINGULAIR  Take 1 tablet (10 mg total) by mouth at bedtime.     pantoprazole 40 MG tablet  Commonly known as:  PROTONIX  Take 1 tablet (40 mg total) by mouth 2 (two) times daily.     predniSONE 10 MG tablet  Commonly known as:  STERAPRED UNI-PAK  Take by mouth daily. Take 5 tabs ( ) on day 1, 4  tabs(40mg ) on day 2, 3tabs (30mg )on day3, 2tabs(20mg ) on day4, 1tab (10mg ) on day 5, then stop.     sodium chloride 0.65 % Soln nasal spray  Commonly known as:  OCEAN  Place 1 spray into the nose as needed for congestion (Dry nasal passages).     SPIRIVA HANDIHALER 18 MCG inhalation capsule  Generic drug:  tiotropium  PLACE 1 CAPSULE (18 MCG TOTAL) INTO INHALER AND INHALE DAILY.       Allergies  Allergen Reactions  . Brovana [Arformoterol]     Ineffective--exacerbated coughing/congestion  . Codeine Other (See Comments)    Hallucinations   . Other Swelling    Bicillin injection in 1961--swelling in lower extremities and joints, and whelps up arm.  Has taken penicillin since w/o reaction.   Follow-up Information    Follow up with Millsaps, Joelene Millin, NP In 1 week.   Why:  this friday for post hospital follow up   Contact information:   [facility] [address] [place] [address] [phone]       Follow up with Shan Levans, MD In 2 weeks.   Specialty:  Pulmonary Disease   Why:  copd    Contact information:   [address] [place]  [address] [phone]        The results of significant diagnostics from this hospitalization (including imaging, microbiology, ancillary and laboratory) are listed below for reference.    Significant Diagnostic Studies: Dg Chest 2 View  05/09/2014   CLINICAL DATA:  Short of breath and chest pressure for 4 days. Hypertension. COPD. History of pneumonia.  EXAM: CHEST  2 VIEW  COMPARISON:  05/05/2014 and 11/12/2013  FINDINGS: Hyperinflation. Midline trachea. Normal heart size and mediastinal contours. Mild right hemidiaphragm elevation. No pleural effusion or pneumothorax. Advanced lower lobe predominant interstitial thickening with right upper lobe scarring. Similar left base opacity.  IMPRESSION: Marked COPD/chronic bronchitis.  Similar left base opacity. This is increased since 11/12/2013. Considerations include evolving scar or residual infection.   Electronically Signed   By: Jeronimo Greaves M.D.   On: 05/09/2014 12:04   Dg Abd 1 View  05/10/2014   CLINICAL DATA:  Nausea and abdominal pain.  EXAM: ABDOMEN - 1 VIEW  COMPARISON:  01/20/2014  FINDINGS: Single supine view of the abdomen and pelvis. Non-obstructive bowel gas pattern. Minimal distal gas. No abnormal abdominal calcifications. No appendicolith. Probable phleboliths in the pelvis. Mild right hemidiaphragm elevation.  IMPRESSION: No acute findings.   Electronically Signed   By: Jeronimo Greaves M.D.   On: 05/10/2014 14:42   Ct Chest Wo Contrast  05/10/2014   CLINICAL DATA:  Recent hospitalization for pneumonia. History of COPD, hypertension and DVT. Initial encounter.  EXAM: CT CHEST WITHOUT CONTRAST  TECHNIQUE: Multidetector CT imaging of the chest was performed following the standard protocol without IV contrast.  COMPARISON:  Radiographs 05/09/2014 and 05/05/2014.  CT 07/18/2013.  FINDINGS: Mediastinum/Nodes: There are no enlarged mediastinal, hilar or axillary lymph nodes. Small mediastinal lymph nodes are stable. The thyroid gland, trachea and  esophagus demonstrate no significant findings. The heart size is normal. There is no pericardial effusion.Diffuse coronary artery calcifications are noted. There is lesser atherosclerosis of the aorta and great vessels.  Lungs/Pleura: There is no pleural effusion. Again demonstrated are diffuse changes of advanced centrilobular emphysema. Chronic volume loss and scarring in the right middle lobe are stable. There is minimal new patchy airspace disease dependently in the right lower lobe. In the left  lower lobe, there is patchy airspace disease or scarring which has progressed compared with the prior CT. No consolidation or dominant nodule identified.  Upper abdomen:  Unremarkable.  There is no adrenal mass.  Musculoskeletal/Chest wall: There is no chest wall mass or suspicious osseous finding. Inferior endplate compression fracture at T7 has minimally progressed from the prior CT. No new fractures demonstrated.  IMPRESSION: 1. Compared with the prior CT from 10 months ago, there are new patchy airspace opacities at both lung bases which may reflect residual pneumonia or postinflammatory scarring. No consolidation or suspicious nodule demonstrated. 2. Advanced emphysema. 3. Atherosclerosis as described.   Electronically Signed   By: Carey BullocksWilliam  Veazey M.D.   On: 05/10/2014 18:33   Dg Chest Port 1 View  05/05/2014   CLINICAL DATA:  74 year old male with shortness of Breath. Current history of COPD. Initial encounter.  EXAM: PORTABLE CHEST - 1 VIEW  COMPARISON:  01/19/2014 and earlier.  FINDINGS: Portable AP upright view at 0959 hrs. Stable lung volumes. Increased patchy opacity at the left lung base superimposed on areas of both attenuated bronchovascular opacity and increased interstitial opacity. Normal cardiac size and mediastinal contours. Visualized tracheal air column is within normal limits. No pneumothorax or pleural effusion identified.  IMPRESSION: Increased patchy opacity at the left lung base compatible  with acute infectious exacerbation of chronic lung disease.   Electronically Signed   By: Odessa FlemingH  Hall M.D.   On: 05/05/2014 10:18    Microbiology: Recent Results (from the past 240 hour(s))  Culture, blood (routine x 2)     Status: None   Collection Time: 05/05/14  9:50 AM  Result Value Ref Range Status   Specimen Description BLOOD BLOOD LEFT FOREARM  Final   Special Requests BOTTLES DRAWN AEROBIC AND ANAEROBIC 5CC  Final   Culture   Final    NO GROWTH 5 DAYS [facility]    Report Status 05/11/2014 FINAL  Final  Culture, blood (routine x 2)     Status: None   Collection Time: 05/05/14  4:05 PM  Result Value Ref Range Status   Specimen Description BLOOD RIGHT ARM  Final   Special Requests BOTTLES DRAWN AEROBIC ONLY 7CC  Final   Culture   Final    NO GROWTH 5 DAYS [facility]formed at [facility]    Report Status 05/11/2014 FINAL  Final  Culture, blood (routine x 2) Call MD if unable to obtain prior to antibiotics being given     Status: None (Preliminary result)   Collection Time: 05/09/14  4:04 PM  Result Value Ref Range Status   Specimen Description BLOOD LEFT ARM  Final   Special Requests BOTTLES DRAWN AEROBIC AND ANAEROBIC 10CC  Final   Culture   Final           BLOOD CULTURE RECEIVED NO GROWTH TO DATE CULTURE WILL BE HELD FOR 5 DAYS BEFORE ISSUING A FINAL NEGATIVE REPORT [facility]    Report Status PENDING  Incomplete  Culture, blood (routine x 2) Call MD if unable to obtain prior to antibiotics being given     Status: None (Preliminary result)   Collection Time: 05/09/14  4:10 PM  Result Value Ref Range Status   Specimen Description BLOOD RIGHT ARM  Final   Special Requests BOTTLES DRAWN AEROBIC AND ANAEROBIC  10CC  Final   Culture   Final           BLOOD CULTURE RECEIVED NO GROWTH  TO DATE CULTURE WILL BE HELD FOR 5 DAYS BEFORE ISSUING A FINAL NEGATIVE REPORT Performed at [facility]    Report Status PENDING   Incomplete     Labs: Basic Metabolic Panel:  Recent Labs Lab 05/06/14 0505 05/09/14 0940 05/10/14 0551 05/10/14 1415 05/11/14 0438 05/12/14 0605  NA 138 139  --  136 135 137  K 3.5 2.8*  --  4.3 4.7 3.9  CL 94* 93*  --  99 98 99  CO2 35* 34*  --  26 26 29   GLUCOSE 161* 113*  --  148* 180* 120*  BUN 29* 28*  --  19 18 18   CREATININE 1.07 1.20  --  0.88 0.86 0.82  CALCIUM 9.4 9.3  --  9.0 8.7 8.9  MG  --   --  2.1 2.1 2.1  --    Liver Function Tests: No results for input(s): AST, ALT, ALKPHOS, BILITOT, PROT, ALBUMIN in the last 168 hours. No results for input(s): LIPASE, AMYLASE in the last 168 hours. No results for input(s): AMMONIA in the last 168 hours. CBC:  Recent Labs Lab 05/06/14 0505 05/09/14 0940 05/11/14 0438 05/12/14 0605  WBC 9.8 10.3 17.6* 18.4*  NEUTROABS  --  7.9*  --   --   HGB 11.9* 13.0 11.0* 11.3*  HCT 38.4* 40.4 35.9* 36.8*  MCV 97.7 94.6 96.2 96.3  PLT 269 261 257 271   Cardiac Enzymes:  Recent Labs Lab 05/09/14 0940  TROPONINI <0.03   BNP: BNP (last 3 results)  Recent Labs  05/05/14 0950  BNP 30.4    ProBNP (last 3 results)  Recent Labs  07/17/13 1110 08/06/13 1220  PROBNP 296.9* 103.4    CBG:  Recent Labs Lab 05/09/14 1623 05/09/14 2108 05/11/14 1123  GLUCAP 116* 168* 151*       Signed:  Verlena Marlette MD, PhD  [facility] 05/12/2014, 11:02 AM

## 2014-05-12 NOTE — Care Management Note (Signed)
    Page 1 of 1   05/12/2014     2:10:43 PM CARE MANAGEMENT NOTE 05/12/2014  Patient:  [name] [name],[name] [name]   Account Number:  [id number]  Date Initiated:  05/12/2014  Documentation initiated by:  Donn PieriniWEBSTER,Woodrow Drab  Subjective/Objective Assessment:   Pt admitted with COPD     Action/Plan:   PTA pt lived at home- [facility]   Anticipated DC Date:  05/12/2014   Anticipated DC Plan:  HOME/SELF CARE         Choice offered to / List presented to:             Status of service:  Completed, signed off Medicare Important Message given?  YES (If response is "NO", the following Medicare IM given date fields will be blank) Date Medicare IM given:  05/12/2014 Medicare IM given by:  Donn PieriniWEBSTER,Javiana Anwar Date Additional Medicare IM given:   Additional Medicare IM given by:    Discharge Disposition:  HOME/SELF CARE  Per UR Regulation:  Reviewed for med. necessity/level of care/duration of stay  If discussed at Long Length of Stay Meetings, dates discussed:    Comments:

## 2014-05-12 NOTE — Progress Notes (Signed)
  Pharmacy Discharge Medication Therapy Review   Total Number of meds on admission: 9 (polypharmacy > 10 meds)  Indications for all medications: [x]  Yes       []  No  Adherence Review  [x]  Excellent (no doses missed/week)     []  Good (no more than 1 dose missed/week)     []  Partial (2-3 doses missed/week)     []  Poor (>3 doses missed/week)  Total number of high risk medications: 5 (Anticoagulants, Dual antiplatelets, oral Antihyperglycemic agents,Insulins, Antipsychotics, Anti-Seizure meds, Inhalers, HF/ACS meds, Antibiotics and HIV medications)   Assessment: (Medication related problems)  Intervention  YES NO  Explanation   Indications      Medication without noted indication []  [x]     Indication without noted medication []  [x]     Duplicate therapy [x]  []  Patient with both duonebs and albuterol nebs on discharge med list - duplication of therapy.  Efficacy      Suboptimal drug or dose selection []  [x]    Insufficient dose/duration []  [x]    Failure to receive therapy  (Rx not filled) []  [x]     Safety      Adverse drug event []  [x]     Drug interaction []  [x]     Excessive dose/duration []  [x]    High-risk medications [x]  []  Inhalers  Compliance     Underuse []  [x]     Overuse []  [x]    Other pertinent pharmacist counseling [x]  []  Discussed inhaler use in detail - difference between maintenance inhalers (Spiriva and Dulera) and prn inhalers for SOB (albuterol inhaler and duonebs). Counseled on rinsing mouth after use of Dulera. Reviewed new medications - 1 month trial of Singulair, prednisone taper, and doxycycline for COPD exacerbation. Patient had a good understanding of his inhalers and other medications.    Total number of new medications upon discharge: 3  Time:  Time spent preparing for discharge counseling: 10 minutes Time spent counseling patient: 5 minutes   PLAN: Recommendations discussed with and accepted by Dr. Roda ShuttersXu and are as follows: - Discontinue albuterol  nebulizers since patient starting duoneb therapy at discharge - All medication changes discussed with patient and inhaler technique and purpose reviewed  Megan E. Supple, Pharm.D Clinical Pharmacy Resident Pager: [phone] 05/12/2014 11:49 AM

## 2014-05-15 LAB — CULTURE, BLOOD (ROUTINE X 2)
CULTURE: NO GROWTH
CULTURE: NO GROWTH

## 2014-05-30 NOTE — ED Provider Notes (Signed)
CSN: [id number]     Arrival date & time 05/05/14  8413 History   First MD Initiated Contact with Patient 05/05/14 [phone]     Chief Complaint  Patient presents with  . Shortness of Breath      HPI Patient presents with increasing shortness of breath.  Saw his PCP and was started on prednisone.  Patient shortness of breath and wheeziness worsened.  Has a moist productive of cough with thick yellow sputum. Past Medical History  Diagnosis Date  . COPD (chronic obstructive pulmonary disease)   . Chronic bronchitis     "get it q yr" (07/17/2013)  . Hypertension   . DVT (deep venous thrombosis) 1990's    "?LLE"  . On home oxygen therapy     "2L 24/7 since Monday" (07/17/2013)  . GERD (gastroesophageal reflux disease)     "related to RX I take" (07/17/2013)  . Daily headache   . Migraine     "all my life; no really bad migraines in 8-9 since work stress is gone" (07/17/2013)  . Pneumonia     "several times"  . Acute respiratory failure with hypoxia 01/2014   Past Surgical History  Procedure Laterality Date  . Tonsillectomy  1949  . Inguinal hernia repair Left ~ 1990   Family History  Problem Relation Age of Onset  . Breast cancer Mother   . Alzheimer's disease Father    History  Substance Use Topics  . Smoking status: Former Smoker -- 1.00 packs/day for 10 years    Types: Cigarettes    Quit date: 02/06/1998  . Smokeless tobacco: Never Used  . Alcohol Use: No    Review of Systems  Unable to perform ROS: Acuity of condition      Allergies  Brovana; Codeine; and Other  Home Medications   Prior to Admission medications   Medication Sig Start Date End Date Taking? Authorizing Provider  albuterol (PROVENTIL HFA;VENTOLIN HFA) 108 (90 BASE) MCG/ACT inhaler Inhale 2 puffs into the lungs every 6 (six) hours as needed for wheezing or shortness of breath. 04/13/14  Yes Storm Frisk, MD  ALPRAZolam Prudy Feeler) 0.5 MG tablet Take 0.5 mg by mouth 3 (three) times daily as needed for  anxiety.  04/06/14  Yes Historical Provider, MD  Aspirin-Caffeine (BAYER BACK & BODY PAIN EX ST) 500-32.5 MG TABS Take 2 tablets by mouth 2 (two) times daily as needed.    Yes Historical Provider, MD  furosemide (LASIX) 40 MG tablet Take 20 mg by mouth daily.   Yes Historical Provider, MD  pantoprazole (PROTONIX) 40 MG tablet Take 1 tablet (40 mg total) by mouth 2 (two) times daily. 11/01/13  Yes Vassie Loll, MD  sodium chloride (OCEAN) 0.65 % SOLN nasal spray Place 1 spray into the nose as needed for congestion (Dry nasal passages).   Yes Historical Provider, MD  SPIRIVA HANDIHALER 18 MCG inhalation capsule PLACE 1 CAPSULE (18 MCG TOTAL) INTO INHALER AND INHALE DAILY. 11/12/13  Yes Storm Frisk, MD  doxycycline (VIBRA-TABS) 100 MG tablet Take 1 tablet (100 mg total) by mouth every 12 (twelve) hours. 05/12/14   Albertine Grates, MD  ipratropium-albuterol (DUONEB) 0.5-2.5 (3) MG/3ML SOLN Take 3 mLs by nebulization every 6 (six) hours as needed. 05/12/14   Albertine Grates, MD  mometasone-formoterol (DULERA) 200-5 MCG/ACT AERO Inhale 2 puffs into the lungs 2 (two) times daily.    Historical Provider, MD  montelukast (SINGULAIR) 10 MG tablet Take 1 tablet (10 mg total) by mouth at bedtime.  05/12/14   Albertine GratesFang Xu, MD  predniSONE (STERAPRED UNI-PAK) 10 MG tablet Take by mouth daily. Take 5 tabs (50mg ) on day 1, 4 tabs(40mg ) on day 2, 3tabs (30mg )on day3, 2tabs(20mg ) on day4, 1tab (10mg ) on day 5, then stop. 05/12/14   Albertine GratesFang Xu, MD   BP 133/97 mmHg  Pulse 119  Temp(Src) 98.3 F (36.8 C) (Oral)  Resp 20  Ht 5\' 8"  (1.727 m)  Wt 179 lb 4.8 oz (81.33 kg)  BMI 27.27 kg/m2  SpO2 93% Physical Exam  Constitutional: He is oriented to person, place, and time. He appears well-developed and well-nourished. No distress.  HENT:  Head: Normocephalic and atraumatic.  Eyes: Pupils are equal, round, and reactive to light.  Neck: Normal range of motion.  Cardiovascular: Normal rate and intact distal pulses.   Pulmonary/Chest: Tachypnea  noted. No respiratory distress. He has wheezes.  Abdominal: Normal appearance. He exhibits no distension.  Musculoskeletal: Normal range of motion.  Neurological: He is alert and oriented to person, place, and time. No cranial nerve deficit.  Skin: Skin is warm and dry. No rash noted.  Psychiatric: He has a normal mood and affect. His behavior is normal.  Nursing note and vitals reviewed.   ED Course  Procedures (including critical care time) Medications  albuterol (PROVENTIL) (2.5 MG/3ML) 0.083% nebulizer solution 10 mg (10 mg Nebulization Given 05/05/14 1008)  ipratropium (ATROVENT) nebulizer solution 0.5 mg (0.5 mg Nebulization Given 05/05/14 1008)  predniSONE (DELTASONE) tablet 20 mg (20 mg Oral Given 05/05/14 0956)  cefTRIAXone (ROCEPHIN) 1 g in dextrose 5 % 50 mL IVPB (0 g Intravenous Stopped 05/05/14 2114)  azithromycin (ZITHROMAX) 500 mg in dextrose 5 % 250 mL IVPB (0 mg Intravenous Stopped 05/05/14 2114)  potassium chloride SA (K-DUR,KLOR-CON) CR tablet 40 mEq (40 mEq Oral Given 05/05/14 1618)  magnesium sulfate IVPB 2 g 50 mL (2 g Intravenous Given 05/05/14 1616)    Labs Review Labs Reviewed  COMPREHENSIVE METABOLIC PANEL - Abnormal; Notable for the following:    Potassium 3.2 (*)    Chloride 94 (*)    Glucose, Bld 153 (*)    BUN 32 (*)    AST 39 (*)    GFR calc non Af Amer 50 (*)    GFR calc Af Amer 59 (*)    All other components within normal limits  CBC WITH DIFFERENTIAL/PLATELET - Abnormal; Notable for the following:    WBC 16.5 (*)    RBC 4.14 (*)    Hemoglobin 12.6 (*)    Neutrophils Relative % 94 (*)    Neutro Abs 15.4 (*)    Lymphocytes Relative 2 (*)    Lymphs Abs 0.3 (*)    All other components within normal limits  INFLUENZA PANEL BY PCR (TYPE A & B, H1N1) - Abnormal; Notable for the following:    Influenza A By PCR POSITIVE (*)    H1N1 flu by pcr DETECTED (*)    All other components within normal limits  BASIC METABOLIC PANEL - Abnormal; Notable for the  following:    Chloride 94 (*)    CO2 35 (*)    Glucose, Bld 161 (*)    BUN 29 (*)    GFR calc non Af Amer 67 (*)    GFR calc Af Amer 77 (*)    All other components within normal limits  CBC - Abnormal; Notable for the following:    RBC 3.93 (*)    Hemoglobin 11.9 (*)    HCT 38.4 (*)  All other components within normal limits  CULTURE, BLOOD (ROUTINE X 2)  CULTURE, BLOOD (ROUTINE X 2)  CULTURE, EXPECTORATED SPUTUM-ASSESSMENT  GRAM STAIN  BRAIN NATRIURETIC PEPTIDE  HIV ANTIBODY (ROUTINE TESTING)  LEGIONELLA ANTIGEN, URINE  STREP PNEUMONIAE URINARY ANTIGEN    Imaging Review No results found.       DG Chest Port 1 View (Final result) Result time: 05/05/14 10:18:44   Final result by Rad Results In Interface (05/05/14 10:18:44)   Narrative:   CLINICAL DATA: 74 year old male with shortness of Breath. Current history of COPD. Initial encounter.  EXAM: PORTABLE CHEST - 1 VIEW  COMPARISON: 01/19/2014 and earlier.  FINDINGS: Portable AP upright view at 0959 hrs. Stable lung volumes. Increased patchy opacity at the left lung base superimposed on areas of both attenuated bronchovascular opacity and increased interstitial opacity. Normal cardiac size and mediastinal contours. Visualized tracheal air column is within normal limits. No pneumothorax or pleural effusion identified.  IMPRESSION: Increased patchy opacity at the left lung base compatible with acute infectious exacerbation of chronic lung disease.   Electronically Signed By: Odessa Fleming M.D. On: 05/05/2014 10:18     MDM   Final diagnoses:  SOB (shortness of breath)        Nelva Nay, MD 05/30/14 [phone]

## 2015-05-05 HISTORY — PX: CATARACT EXTRACTION W/ INTRAOCULAR LENS IMPLANT: SHX1309

## 2015-05-06 ENCOUNTER — Encounter (HOSPITAL_COMMUNITY): Payer: Self-pay | Admitting: Emergency Medicine

## 2015-05-06 ENCOUNTER — Observation Stay (HOSPITAL_COMMUNITY)
Admission: EM | Admit: 2015-05-06 | Discharge: 2015-05-10 | Disposition: A | Discharge status: Home / Self Care | Payer: Medicare Other | Attending: Oncology | Admitting: Oncology

## 2015-05-06 ENCOUNTER — Observation Stay (HOSPITAL_COMMUNITY): Payer: Medicare Other

## 2015-05-06 ENCOUNTER — Emergency Department (HOSPITAL_COMMUNITY): Payer: Medicare Other

## 2015-05-06 DIAGNOSIS — J440 Chronic obstructive pulmonary disease with acute lower respiratory infection: Secondary | ICD-10-CM | POA: Diagnosis not present

## 2015-05-06 DIAGNOSIS — Z9981 Dependence on supplemental oxygen: Secondary | ICD-10-CM | POA: Diagnosis not present

## 2015-05-06 DIAGNOSIS — G43909 Migraine, unspecified, not intractable, without status migrainosus: Secondary | ICD-10-CM | POA: Diagnosis not present

## 2015-05-06 DIAGNOSIS — Z87891 Personal history of nicotine dependence: Secondary | ICD-10-CM | POA: Insufficient documentation

## 2015-05-06 DIAGNOSIS — H269 Unspecified cataract: Secondary | ICD-10-CM | POA: Diagnosis present

## 2015-05-06 DIAGNOSIS — R140 Abdominal distension (gaseous): Secondary | ICD-10-CM

## 2015-05-06 DIAGNOSIS — Z9841 Cataract extraction status, right eye: Secondary | ICD-10-CM | POA: Diagnosis not present

## 2015-05-06 DIAGNOSIS — J101 Influenza due to other identified influenza virus with other respiratory manifestations: Secondary | ICD-10-CM | POA: Diagnosis present

## 2015-05-06 DIAGNOSIS — J209 Acute bronchitis, unspecified: Secondary | ICD-10-CM | POA: Diagnosis not present

## 2015-05-06 DIAGNOSIS — E538 Deficiency of other specified B group vitamins: Secondary | ICD-10-CM | POA: Diagnosis present

## 2015-05-06 DIAGNOSIS — J441 Chronic obstructive pulmonary disease with (acute) exacerbation: Secondary | ICD-10-CM | POA: Diagnosis present

## 2015-05-06 DIAGNOSIS — Z7982 Long term (current) use of aspirin: Secondary | ICD-10-CM | POA: Diagnosis not present

## 2015-05-06 DIAGNOSIS — R05 Cough: Secondary | ICD-10-CM | POA: Diagnosis not present

## 2015-05-06 DIAGNOSIS — J9621 Acute and chronic respiratory failure with hypoxia: Secondary | ICD-10-CM | POA: Diagnosis not present

## 2015-05-06 DIAGNOSIS — K219 Gastro-esophageal reflux disease without esophagitis: Secondary | ICD-10-CM | POA: Diagnosis not present

## 2015-05-06 DIAGNOSIS — F419 Anxiety disorder, unspecified: Secondary | ICD-10-CM | POA: Insufficient documentation

## 2015-05-06 DIAGNOSIS — D649 Anemia, unspecified: Secondary | ICD-10-CM | POA: Diagnosis not present

## 2015-05-06 DIAGNOSIS — R918 Other nonspecific abnormal finding of lung field: Secondary | ICD-10-CM | POA: Diagnosis not present

## 2015-05-06 DIAGNOSIS — D509 Iron deficiency anemia, unspecified: Secondary | ICD-10-CM | POA: Diagnosis present

## 2015-05-06 DIAGNOSIS — I10 Essential (primary) hypertension: Secondary | ICD-10-CM | POA: Diagnosis not present

## 2015-05-06 DIAGNOSIS — R0902 Hypoxemia: Secondary | ICD-10-CM | POA: Diagnosis present

## 2015-05-06 LAB — CBC
HCT: 34.8 % — ABNORMAL LOW (ref 39.0–52.0)
Hemoglobin: 9.8 g/dL — ABNORMAL LOW (ref 13.0–17.0)
MCH: 24.8 pg — AB (ref 26.0–34.0)
MCHC: 28.2 g/dL — ABNORMAL LOW (ref 30.0–36.0)
MCV: 88.1 fL (ref 78.0–100.0)
PLATELETS: 291 10*3/uL (ref 150–400)
RBC: 3.95 MIL/uL — ABNORMAL LOW (ref 4.22–5.81)
RDW: 17.6 % — AB (ref 11.5–15.5)
WBC: 8.7 10*3/uL (ref 4.0–10.5)

## 2015-05-06 LAB — INFLUENZA PANEL BY PCR (TYPE A & B)
H1N1 flu by pcr: NOT DETECTED
Influenza A By PCR: NEGATIVE
Influenza B By PCR: POSITIVE — AB

## 2015-05-06 LAB — BASIC METABOLIC PANEL
ANION GAP: 13 (ref 5–15)
BUN: 26 mg/dL — ABNORMAL HIGH (ref 6–20)
CHLORIDE: 97 mmol/L — AB (ref 101–111)
CO2: 31 mmol/L (ref 22–32)
CREATININE: 1.23 mg/dL (ref 0.61–1.24)
Calcium: 9.4 mg/dL (ref 8.9–10.3)
GFR calc Af Amer: >60 mL/min (ref >60)
GFR calc non Af Amer: 56 mL/min — ABNORMAL LOW (ref >60)
Glucose, Bld: 103 mg/dL — ABNORMAL HIGH (ref 65–99)
Potassium: 3.5 mmol/L (ref 3.5–5.1)
Sodium: 141 mmol/L (ref 135–145)

## 2015-05-06 LAB — BRAIN NATRIURETIC PEPTIDE: B Natriuretic Peptide: 57.7 pg/mL (ref 0.0–100.0)

## 2015-05-06 MED ORDER — ACETAMINOPHEN 325 MG PO TABS
650.0000 mg | ORAL_TABLET | Freq: Four times a day (QID) | ORAL | Status: DC | PRN
  Administered 2015-05-06 – 2015-05-09 (×2): 650 mg via ORAL
  Filled 2015-05-06 (×2): qty 2

## 2015-05-06 MED ORDER — PNEUMOCOCCAL VAC POLYVALENT 25 MCG/0.5ML IJ INJ: 0.5000 mL | INJECTION | INTRAMUSCULAR | Status: DC | PRN

## 2015-05-06 MED ORDER — ONDANSETRON HCL 4 MG PO TABS: 4.0000 mg | ORAL_TABLET | Freq: Four times a day (QID) | ORAL | Status: DC | PRN

## 2015-05-06 MED ORDER — DM-GUAIFENESIN ER 30-600 MG PO TB12
1.0000 | ORAL_TABLET | Freq: Two times a day (BID) | ORAL | Status: DC
  Administered 2015-05-06 – 2015-05-10 (×9): 1 via ORAL
  Filled 2015-05-06 (×9): qty 1

## 2015-05-06 MED ORDER — IPRATROPIUM-ALBUTEROL 0.5-2.5 (3) MG/3ML IN SOLN: 3.0000 mL | RESPIRATORY_TRACT | Status: DC

## 2015-05-06 MED ORDER — LEVOFLOXACIN IN D5W 750 MG/150ML IV SOLN
750.0000 mg | Freq: Once | INTRAVENOUS | Status: AC
  Administered 2015-05-06: 750 mg via INTRAVENOUS
  Filled 2015-05-06: qty 150

## 2015-05-06 MED ORDER — PANTOPRAZOLE SODIUM 40 MG PO TBEC
40.0000 mg | DELAYED_RELEASE_TABLET | Freq: Two times a day (BID) | ORAL | Status: DC
  Administered 2015-05-06 – 2015-05-10 (×8): 40 mg via ORAL
  Filled 2015-05-06 (×8): qty 1

## 2015-05-06 MED ORDER — IPRATROPIUM-ALBUTEROL 0.5-2.5 (3) MG/3ML IN SOLN
3.0000 mL | Freq: Once | RESPIRATORY_TRACT | Status: AC
  Administered 2015-05-06: 3 mL via RESPIRATORY_TRACT

## 2015-05-06 MED ORDER — SALINE SPRAY 0.65 % NA SOLN
1.0000 | NASAL | Status: DC | PRN
  Filled 2015-05-06: qty 44

## 2015-05-06 MED ORDER — IPRATROPIUM-ALBUTEROL 0.5-2.5 (3) MG/3ML IN SOLN
RESPIRATORY_TRACT | Status: AC
  Administered 2015-05-06: 3 mL via RESPIRATORY_TRACT
  Filled 2015-05-06: qty 3

## 2015-05-06 MED ORDER — ALBUTEROL SULFATE (2.5 MG/3ML) 0.083% IN NEBU
5.0000 mg | INHALATION_SOLUTION | RESPIRATORY_TRACT | Status: DC | PRN
  Administered 2015-05-10: 5 mg via RESPIRATORY_TRACT
  Filled 2015-05-06: qty 6

## 2015-05-06 MED ORDER — FUROSEMIDE 20 MG PO TABS
20.0000 mg | ORAL_TABLET | Freq: Every day | ORAL | Status: DC
  Administered 2015-05-07 – 2015-05-08 (×2): 20 mg via ORAL
  Filled 2015-05-06 (×2): qty 1

## 2015-05-06 MED ORDER — METHYLPREDNISOLONE SODIUM SUCC 125 MG IJ SOLR
60.0000 mg | Freq: Four times a day (QID) | INTRAMUSCULAR | Status: AC
  Administered 2015-05-06 – 2015-05-07 (×6): 60 mg via INTRAVENOUS
  Filled 2015-05-06 (×6): qty 2

## 2015-05-06 MED ORDER — IPRATROPIUM-ALBUTEROL 0.5-2.5 (3) MG/3ML IN SOLN
3.0000 mL | RESPIRATORY_TRACT | Status: DC
  Administered 2015-05-06 – 2015-05-07 (×9): 3 mL via RESPIRATORY_TRACT
  Filled 2015-05-06 (×9): qty 3

## 2015-05-06 MED ORDER — ALPRAZOLAM 0.5 MG PO TABS
0.5000 mg | ORAL_TABLET | Freq: Three times a day (TID) | ORAL | Status: DC | PRN
  Administered 2015-05-06 – 2015-05-10 (×10): 0.5 mg via ORAL
  Filled 2015-05-06: qty 1
  Filled 2015-05-06: qty 2
  Filled 2015-05-06 (×9): qty 1

## 2015-05-06 MED ORDER — ENOXAPARIN SODIUM 40 MG/0.4ML ~~LOC~~ SOLN
40.0000 mg | SUBCUTANEOUS | Status: DC
  Administered 2015-05-07 – 2015-05-10 (×4): 40 mg via SUBCUTANEOUS
  Filled 2015-05-06 (×4): qty 0.4

## 2015-05-06 MED ORDER — SODIUM CHLORIDE 0.9% FLUSH
3.0000 mL | Freq: Two times a day (BID) | INTRAVENOUS | Status: DC
  Administered 2015-05-06 – 2015-05-10 (×8): 3 mL via INTRAVENOUS

## 2015-05-06 MED ORDER — MONTELUKAST SODIUM 10 MG PO TABS: 10.0000 mg | ORAL_TABLET | Freq: Every day | ORAL | Status: DC

## 2015-05-06 MED ORDER — BUDESONIDE 0.5 MG/2ML IN SUSP
0.2500 mg | Freq: Two times a day (BID) | RESPIRATORY_TRACT | Status: DC
Start: 2015-05-06 — End: 2015-05-07
  Administered 2015-05-06: 0.25 mg via RESPIRATORY_TRACT
  Filled 2015-05-06 (×2): qty 2

## 2015-05-06 MED ORDER — ONDANSETRON HCL 4 MG/2ML IJ SOLN: 4.0000 mg | Freq: Four times a day (QID) | INTRAMUSCULAR | Status: DC | PRN

## 2015-05-06 MED ORDER — OSELTAMIVIR PHOSPHATE 75 MG PO CAPS
75.0000 mg | ORAL_CAPSULE | Freq: Two times a day (BID) | ORAL | Status: DC
  Administered 2015-05-06 – 2015-05-10 (×8): 75 mg via ORAL
  Filled 2015-05-06 (×11): qty 1

## 2015-05-06 MED ORDER — SODIUM CHLORIDE 0.9 % IV SOLN
INTRAVENOUS | Status: DC
  Administered 2015-05-06: 09:00:00 via INTRAVENOUS

## 2015-05-06 MED ORDER — SENNOSIDES-DOCUSATE SODIUM 8.6-50 MG PO TABS: 1.0000 | ORAL_TABLET | Freq: Every evening | ORAL | Status: DC | PRN

## 2015-05-06 MED ORDER — ACETAMINOPHEN 650 MG RE SUPP: 650.0000 mg | Freq: Four times a day (QID) | RECTAL | Status: DC | PRN

## 2015-05-06 MED ORDER — ALBUTEROL SULFATE (2.5 MG/3ML) 0.083% IN NEBU
5.0000 mg | INHALATION_SOLUTION | Freq: Once | RESPIRATORY_TRACT | Status: DC
Start: 2015-05-06 — End: 2015-05-06

## 2015-05-06 MED ORDER — DEXTROSE 5 % IV SOLN
500.0000 mg | INTRAVENOUS | Status: AC
  Administered 2015-05-06 – 2015-05-08 (×3): 500 mg via INTRAVENOUS
  Filled 2015-05-06 (×3): qty 500

## 2015-05-06 MED ORDER — METHYLPREDNISOLONE SODIUM SUCC 125 MG IJ SOLR
125.0000 mg | Freq: Once | INTRAMUSCULAR | Status: AC
  Administered 2015-05-06: 125 mg via INTRAVENOUS
  Filled 2015-05-06: qty 2

## 2015-05-06 MED ORDER — PANTOPRAZOLE SODIUM 40 MG PO TBEC
40.0000 mg | DELAYED_RELEASE_TABLET | Freq: Every day | ORAL | Status: DC
  Filled 2015-05-06: qty 1

## 2015-05-06 NOTE — ED Notes (Signed)
EDP at bedside. Pt. Placed on 4L oxygen at this time because of oxygen saturation upper 80's.

## 2015-05-06 NOTE — ED Notes (Signed)
Regular lunch tray ordered at 13:30pm. Spoke w/ Darius

## 2015-05-06 NOTE — H&P (Signed)
Date: 05/06/2015               Patient Name:  [name] [name]. MRN: [id number]  DOB: 1940-06-14 Age / Sex: 75 y.o., male   PCP: Marva Panda, NP         Medical Service: Internal Medicine Teaching Service         Attending Physician: Dr. Levert Feinstein, MD    First Contact: Dr. Allena Katz Pager: [phone]  Second Contact: Dr. Isabella Bowens Pager: [phone]       After Hours (After 5p/  First Contact Pager: [phone]  weekends / holidays): Second Contact Pager: [phone]   Chief Complaint: shortness of breath   History of Present Illness:   Darylene Price. Sem is a very pleasant 75 year old man with past medical history of Gold Stage 3 COPD on home oxygen, hypertension, chronic migraine headaches, and GERD who presents with shortness of breath.   He reports feeling in his normal state of health until 4 days ago when he began to have initially dry cough which then turned into yellow-colored phlegm in addition to wheezing and chest tightness. Last night he became short of breath with SpO2 in low 90's at home. He is on 2L of oxygen 24 hrs a day for over a year. He has been using his duoneb nebulizer machine every 4 hrs for the past few days with no relief. He has been compliant with taking dulera twice a day but is no longer on spiriva. He denies fever, chills, URI symptoms, weakness, LE edema, nausea, vomiting, abdominal pain, or change in BM/urination. He denies sick contacts. He reports having COPD flare every 3-4 months in the past year with last one 3 months ago. He reports he has been intubated in the past. He is a former smoker with 0.5 ppd use for 15-20 years and quit about 17 years ago. He can only walk 60 -70 ft without becoming short of breath. He last saw his pulmonologist Dr. Delford Field one year ago. His last PFT's on 03/20/11 revealed Stage 3 COPD with FEV1 of 36%. He has had PCV13 vaccination and flu shot this year. He has never had pulmonary rehab. Of note he had right cataract surgery  yesterday.   On admission to the ED he was hypoxic to 82% on 2L of oxygen requiring uptitration to 4L. Chest xray revealed COPD with no pneumonia or pulmonary edema. He was given breathing treatment, IV solumedrol, IVF's, and levaquin. He does not feel that his breathing has improved.   Meds:  No current facility-administered medications on file prior to encounter.   Current Outpatient Prescriptions on File Prior to Encounter  Medication Sig Dispense Refill  . albuterol (PROVENTIL HFA;VENTOLIN HFA) 108 (90 BASE) MCG/ACT inhaler Inhale 2 puffs into the lungs every 6 (six) hours as needed for wheezing or shortness of breath. 18 g 4  . ALPRAZolam (XANAX) 0.5 MG tablet Take 0.5 mg by mouth 3 (three) times daily as needed for anxiety.   2  . Aspirin-Caffeine (BAYER BACK & BODY PAIN EX ST) 500-32.5 MG TABS Take 2 tablets by mouth 2 (two) times daily as needed (for back pain).     . furosemide (LASIX) 40 MG tablet Take 20 mg by mouth daily.    Marland Kitchen ipratropium-albuterol (DUONEB) 0.5-2.5 (3) MG/3ML SOLN Take 3 mLs by nebulization every 6 (six) hours as needed. 360 mL 3  . mometasone-formoterol (DULERA) 200-5 MCG/ACT AERO Inhale 2 puffs into the lungs 2 (two) times daily.    Marland Kitchen  montelukast (SINGULAIR) 10 MG tablet Take 1 tablet (10 mg total) by mouth at bedtime. 30 tablet 0  . pantoprazole (PROTONIX) 40 MG tablet Take 1 tablet (40 mg total) by mouth 2 (two) times daily. 60 tablet 1  . sodium chloride (OCEAN) 0.65 % SOLN nasal spray Place 1 spray into the nose as needed for congestion (Dry nasal passages).    . SPIRIVA HANDIHALER 18 MCG inhalation capsule PLACE 1 CAPSULE (18 MCG TOTAL) INTO INHALER AND INHALE DAILY. (Patient not taking: Reported on 05/06/2015) 30 capsule 11     Allergies: Allergies as of 05/06/2015 - Review Complete 05/06/2015  Allergen Reaction Noted  . Brovana [arformoterol]  01/19/2014  . Codeine Other (See Comments)   . Other Swelling 01/19/2014   Past Medical History  Diagnosis  Date  . COPD (chronic obstructive pulmonary disease) (HCC)   . Chronic bronchitis     "get it q yr" (07/17/2013)  . Hypertension   . DVT (deep venous thrombosis) (HCC) 1990's    "?LLE"  . On home oxygen therapy     "2L 24/7 since Monday" (07/17/2013)  . GERD (gastroesophageal reflux disease)     "related to RX I take" (07/17/2013)  . Daily headache   . Migraine     "all my life; no really bad migraines in 8-9 since work stress is gone" (07/17/2013)  . Pneumonia     "several times"  . Acute respiratory failure with hypoxia (HCC) 01/2014   Past Surgical History  Procedure Laterality Date  . Tonsillectomy  1949  . Inguinal hernia repair Left ~ 1990   Family History  Problem Relation Age of Onset  . Breast cancer Mother   . Alzheimer's disease Father    Social History   Social History  . Marital Status: Married    Spouse Name: N/A  . Number of Children: N/A  . Years of Education: N/A   Occupational History  .      [employer]   Social History Main Topics  . Smoking status: Former Smoker -- 1.00 packs/day for 10 years    Types: Cigarettes    Quit date: 02/06/1998  . Smokeless tobacco: Never Used  . Alcohol Use: No  . Drug Use: No  . Sexual Activity: No   Other Topics Concern  . Not on file   Social History Narrative   Used to work at [employer]   Used to also work Holiday representative   Not married at [employer] alone       Review of Systems:Review of Systems  Constitutional: Negative for fever, chills and malaise/fatigue.  HENT: Negative for congestion and sore throat.   Eyes:       Right cataract removal yesterday  Respiratory: Positive for cough, sputum production, shortness of breath and wheezing.   Cardiovascular: Negative for chest pain, orthopnea and leg swelling.       Chest tightness  Gastrointestinal: Negative for heartburn (controlled on PPI), nausea, vomiting, abdominal pain, diarrhea and constipation.       Abdominal  distension  Genitourinary: Negative for dysuria, urgency, frequency and hematuria.  Musculoskeletal: Negative for back pain.  Skin: Negative for rash.  Neurological: Positive for dizziness and headaches (chronic migraines). Negative for weakness.  Endo/Heme/Allergies: Negative for environmental allergies.  Psychiatric/Behavioral: The patient is nervous/anxious.       Physical Exam: Blood pressure 132/74, pulse 97, temperature 98.8 F (37.1 C), temperature source Oral, resp. rate 25, height  (1.6 m), weight  185 lb (83.915 kg), SpO2 97 %.  Physical Exam  Constitutional: He is oriented to person, place, and time. He appears well-developed and well-nourished. No distress.  HENT:  Head: Normocephalic and atraumatic.  Right Ear: External ear normal.  Left Ear: External ear normal.  Nose: Nose normal.  Mouth/Throat: No oropharyngeal exudate.  Eyes: Conjunctivae and EOM are normal. Pupils are equal, round, and reactive to light. Right eye exhibits no discharge. Left eye exhibits no discharge. No scleral icterus.  Neck: Normal range of motion. Neck supple.  Cardiovascular: Normal rate, regular rhythm and normal heart sounds.   Pulmonary/Chest: Effort normal. He has wheezes (expiratory).  Able to speak in full sentences. Moderate air flow.   Abdominal: Soft. Bowel sounds are normal. He exhibits distension. There is no tenderness. There is no rebound and no guarding.  Musculoskeletal: Normal range of motion. He exhibits no edema or tenderness.  Neurological: He is alert and oriented to person, place, and time.  Skin: Skin is warm and dry. No rash noted. He is not diaphoretic. No erythema. No pallor.  Psychiatric: He has a normal mood and affect. His behavior is normal. Judgment and thought content normal.     Lab results: Basic Metabolic Panel:  Recent Labs  09/81/19 0731  NA 141  K 3.5  CL 97*  CO2 31  GLUCOSE 103*  BUN 26*  CREATININE 1.23  CALCIUM 9.4   CBC:  Recent  Labs  05/06/15 0731  WBC 8.7  HGB 9.8*  HCT 34.8*  MCV 88.1  PLT 291    Imaging results:  Dg Chest 2 View  05/06/2015  CLINICAL DATA:  Shortness of breath with cough EXAM: CHEST  2 VIEW COMPARISON:  May 09, 2014 chest radiograph and May 10, 2014 chest CT FINDINGS: There is underlying COPD with chronic peribronchial thickening. There is scarring in the right mid lung, inferior lingula, and left base regions. There is no frank edema or consolidation. The heart size is normal. Pulmonary vascularity is within normal limits and stable. There is no evidence of adenopathy. Bones are osteoporotic. There is anterior wedging of a mid thoracic vertebral body, stable. IMPRESSION: Areas of scarring with underlying COPD and chronic bronchitis. No frank edema or consolidation. No change in cardiac silhouette. Bones osteoporotic. Electronically Signed   By: Bretta Bang III M.D.   On: 05/06/2015 08:08    Other results: EKG:  Date/Time: Thursday May 06 2015 07:18:45 EDT Ventricular Rate: 107 PR Interval: 152 QRS Duration: 96 QT Interval: 358 QTC Calculation: 477 R Axis: 73 Text Interpretation: Sinus tachycardia with Fusion complexes Low voltage QRS Nonspecific ST abnormality Abnormal ECG Artifact Since last tracing rate faster  Assessment & Plan by Problem:  Acute on chronic hypoxic respiratory failure in setting of GOLD Stage 3 COPD Exacerbation -  Pt with 4-day history of worsening productive cough, dyspnea, chest tightness, and increased oxygen requirement despite breathing treatments most likely due to flare of severe COPD. On admission to the ED he was hypoxic to 82% on 2L of oxygen requiring uptitration to 4L. Chest xray revealed COPD with no pneumonia or pulmonary edema. He was given breathing treatment, IV solumedrol, IVF's, and lV levaquin.  -Maintain continuous pulse oximetry  -Oxygen therapy to keep SpO2 88-90% -Bipap as needed  -Droplet precautions until flu PCR results    -Obtain sputum culture and gram stain -Start duoneb nebulizer Q 4 hr while awake and albuterol nebulizer 5 mg Q 2 hr PRN -Start IV solumedrol 60 mg Q  6 hr (received 125 mg in ED) -Start IV azithromycin 500 mg for 3 days in setting of COPD flare (received IV levaquin 750 mg in ED) -Start pulmicort nebulizer 0.25 mg BID (hold home dulera due to LABA) -Start mucinex-DM BID for productive cough -Administer PSV23 before discharge (received PCV13 in 2015) -Pt needs pulmonary rehab on discharge and pulmonology follow-up for updated PFT's   Hypertension - Currently normotensive. -Continue home lasix 20 mg daily  Chronic anxiety - Currently with mild anxiety.  -Continue home xanax 0.5 mg TID PRN  Cataract s/p removal - Pt had surgery yesterday.  -Pt has eye drops of home that he will administer himself  Chronic Normocytic Anemia - Hg 9.8 which is below baseline 12. Pt denies active bleeding. It does not appear he has ever had screening colonoscopy. Etiology unclear.  -Obtain anemia panel, smear review, PT/PTT, HCV Ab, and FOBT -Consider MM work-up with free light chains and SPEP if work-up unrevealing  GERD - Currently controlled.  -Continue home protonix 40 mg BID  Diet: Regular  DVT Ppx: Lovenox  Code: Full    Dispo: Disposition is deferred at this time, awaiting improvement of current medical problems. Anticipated discharge in approximately 1-4 day(s).   The patient does have a current PCP Marva Panda(Kimberly Millsaps, NP) and [facility].  The patient does not have transportation limitations that hinder transportation to clinic appointments.  Signed: Otis BraceMarjan Denijah Karrer, MD 05/06/2015, 11:50 AM

## 2015-05-06 NOTE — Progress Notes (Signed)
[name] Morningddie Poudrier Jr. [id number] Admission Data: 05/06/2015 6:03 PM Attending Provider: Levert FeinsteinJames M Granfortuna, MD  WUJ:[id number]:[place], [name], NP Consults/ Treatment Team:    [name] MorningEddie Fielding Jr. is a 75 y.o. male patient admitted from ED awake, alert  & orientated  X 3,  Full Code, VSS - Blood pressure 126/71, pulse 101, temperature 98.3 F (36.8 C), temperature source Oral, resp. rate 19, height 5\' 3"  (1.6 m), weight 83.915 kg (185 lb), SpO2 95 %., O2   4 L nasal cannular, shortness of breath noted with exertion, no c/o chest pain, no distress noted. Tele # 24 [name].   IV site WDL:  Left AC with a transparent dsg that's clean dry and intact. IV site Saline Locked.   Allergies:   Allergies  Allergen Reactions  . Brovana [Arformoterol]     Ineffective--exacerbated coughing/congestion  . Codeine Other (See Comments)    Hallucinations, blurred vision  . Other Swelling    Bicillin injection in 1961--swelling in lower extremities and joints, and whelps up arm.  Has taken penicillin since w/o reaction.    Pt orientation to unit, room and routine. Information packet given to patient and safety video watched.  Admission INP armband ID verified with patient, and in place. SR up x 2, fall risk assessment complete with Patient and family verbalizing understanding of risks associated with falls. Pt verbalizes an understanding of how to use the call bell and to call for help before getting out of bed.  Skin, clean-dry- intact without evidence of bruising, or skin tears.   No evidence of skin break down noted on exam. Bruise noted to left foot. Skin assessment done with [name].  Will cont to monitor and assist as needed.  Tobin [name], RN 05/06/2015 6:34 PM

## 2015-05-06 NOTE — ED Notes (Addendum)
Increasing SOB since Monday, productive cough with yellow sputum, hx of copd, wears o2 2lpm at home. Pt O2 sats 82% on 2lpm on arrival. Pt states he took an albuterol treatment at 4:00 this morning without relief.

## 2015-05-06 NOTE — ED Provider Notes (Signed)
CSN: [id number]     Arrival date & time 05/06/15  0710 History   First MD Initiated Contact with Patient 05/06/15 [phone]     Chief Complaint  Patient presents with  . Shortness of Breath     (Consider location/radiation/quality/duration/timing/severity/associated sxs/prior Treatment) HPI [name] Pierce CraneLashley Jr. is a 75 y.o. male presents for evaluation of worsening shortness of breath with cough, present for 3 days and not improving with usual home treatment. Additionally, this morning, he noticed his oxygen was low when he "could not get it to come up". He last used a nebulizer at home, around 4 AM. He is on chronic home oxygen therapy. He had an ophthalmologic procedure yesterday and that that time, his oxygen saturation was between 91 and 93%. He has a cough productive of green sputum. He denies fever, nausea, vomiting, weakness or dizziness. He feels tremulous. He denies headache, chest pain or back pain. He came here by private vehicle for evaluation.     Past Medical History  Diagnosis Date  . COPD (chronic obstructive pulmonary disease) (HCC)   . Chronic bronchitis     "get it q yr" (07/17/2013)  . Hypertension   . DVT (deep venous thrombosis) (HCC) 1990's    "?LLE"  . On home oxygen therapy     "2L 24/7 since Monday" (07/17/2013)  . GERD (gastroesophageal reflux disease)     "related to RX I take" (07/17/2013)  . Daily headache   . Migraine     "all my life; no really bad migraines in 8-9 since work stress is gone" (07/17/2013)  . Pneumonia     "several times"  . Acute respiratory failure with hypoxia (HCC) 01/2014   Past Surgical History  Procedure Laterality Date  . Tonsillectomy  1949  . Inguinal hernia repair Left ~ 1990   Family History  Problem Relation Age of Onset  . Breast cancer Mother   . Alzheimer's disease Father    Social History  Substance Use Topics  . Smoking status: Former Smoker -- 1.00 packs/day for 10 years    Types: Cigarettes    Quit date:  02/06/1998  . Smokeless tobacco: Never Used  . Alcohol Use: No    Review of Systems  All other systems reviewed and are negative.     Allergies  Brovana; Codeine; and Other  Home Medications   Prior to Admission medications   Medication Sig Start Date End Date Taking? Authorizing Provider  albuterol (PROVENTIL HFA;VENTOLIN HFA) 108 (90 BASE) MCG/ACT inhaler Inhale 2 puffs into the lungs every 6 (six) hours as needed for wheezing or shortness of breath. 04/13/14  Yes Storm FriskPatrick E Wright, MD  ALPRAZolam Prudy Feeler(XANAX) 0.5 MG tablet Take 0.5 mg by mouth 3 (three) times daily as needed for anxiety.  04/06/14  Yes Historical Provider, MD  aspirin 325 MG tablet Take 650 mg by mouth as needed for headache.   Yes Historical Provider, MD  Aspirin-Caffeine (BAYER BACK & BODY PAIN EX ST) 500-32.5 MG TABS Take 2 tablets by mouth 2 (two) times daily as needed (for back pain).    Yes Historical Provider, MD  Bromfenac Sodium (PROLENSA) 0.07 % SOLN Place 1 drop into the right eye daily.   Yes Historical Provider, MD  Calcium Carbonate Antacid (ANTACID PO) Take by mouth.   Yes Historical Provider, MD  furosemide (LASIX) 40 MG tablet Take 20 mg by mouth daily.   Yes Historical Provider, MD  ipratropium-albuterol (DUONEB) 0.5-2.5 (3) MG/3ML SOLN Take 3 mLs by nebulization  every 6 (six) hours as needed. 05/12/14  Yes Albertine Grates, MD  mometasone-formoterol [facility]) 200-5 MCG/ACT AERO Inhale 2 puffs into the lungs 2 (two) times daily.   Yes Historical Provider, MD  montelukast (SINGULAIR) 10 MG tablet Take 1 tablet (10 mg total) by mouth at bedtime. 05/12/14  Yes Albertine Grates, MD  ofloxacin (OCUFLOX) 0.3 % ophthalmic solution Place 1 drop into the right eye 2 (two) times daily.   Yes Historical Provider, MD  pantoprazole (PROTONIX) 40 MG tablet Take 1 tablet (40 mg total) by mouth 2 (two) times daily. 11/01/13  Yes Vassie Loll, MD  sodium chloride (OCEAN) 0.65 % SOLN nasal spray Place 1 spray into the nose as needed for congestion  (Dry nasal passages).   Yes Historical Provider, MD  SPIRIVA HANDIHALER 18 MCG inhalation capsule PLACE 1 CAPSULE (18 MCG TOTAL) INTO INHALER AND INHALE DAILY. Patient not taking: Reported on 05/06/2015 11/12/13   Storm Frisk, MD   BP 111/69 mmHg  Pulse 105  Temp(Src) 98.8 F (37.1 C) (Oral)  Resp 24  Ht  (1.6 m)  Wt 185 lb (83.915 kg)  BMI 32.78 kg/m2  SpO2 97% Physical Exam  Constitutional: He is oriented to person, place, and time. He appears well-developed and well-nourished. He appears distressed (Uncomfortable, tremulous).  HENT:  Head: Normocephalic and atraumatic.  Right Ear: External ear normal.  Left Ear: External ear normal.  Eyes: Conjunctivae and EOM are normal. Pupils are equal, round, and reactive to light.  Neck: Normal range of motion and phonation normal. Neck supple.  Cardiovascular: Normal rate, regular rhythm and normal heart sounds.   Pulmonary/Chest: He is in respiratory distress (Mild increased work of breathing, splinting.). He exhibits no bony tenderness.  Moderately decreased air movement bilaterally with scattered wheezes and a few rhonchi.  Abdominal: Soft. There is no tenderness.  Musculoskeletal: Normal range of motion. He exhibits edema (1+ bilateral lower legs.).  Neurological: He is alert and oriented to person, place, and time. No cranial nerve deficit or sensory deficit. He exhibits normal muscle tone. Coordination normal.  Skin: Skin is warm, dry and intact.  Psychiatric: He has a normal mood and affect. His behavior is normal. Judgment and thought content normal.  Nursing note and vitals reviewed.   ED Course  Procedures (including critical care time)  Initial clinical impression- CPD exacerbation with new increased oxygen requirement. Suspect infectious component with appearance sputum, unimproved with current home treatment, which will require admission. Evaluation initiated to rule out pneumonia, and ACS.  Medications  0.9 %   sodium chloride infusion ( Intravenous New Bag/Given 05/06/15 0838)  levofloxacin (LEVAQUIN) IVPB 750 mg (750 mg Intravenous New Bag/Given 05/06/15 0838)  ipratropium-albuterol (DUONEB) 0.5-2.5 (3) MG/3ML nebulizer solution 3 mL (3 mLs Nebulization Given 05/06/15 0730)  methylPREDNISolone sodium succinate (SOLU-MEDROL) 125 mg/2 mL injection 125 mg (125 mg Intravenous Given 05/06/15 0837)    Patient Vitals for the past 24 hrs:  BP Temp Temp src Pulse Resp SpO2 Height Weight  05/06/15 0945 111/69 mmHg - - 105 24 97 % - -  05/06/15 0930 137/63 mmHg - - 106 23 92 % - -  05/06/15 0900 128/85 mmHg - - 103 17 92 % - -  05/06/15 0721 159/89 mmHg 98.8 F (37.1 C) Oral 107 (!) 28 96 %  (1.6 m) 185 lb (83.915 kg)    9:57 AM Reevaluation with update and discussion. After initial assessment and treatment, an updated evaluation reveals He remains uncomfortable with tremulousness,  and decreased air movement. Findings discussed with patient, all questions were answered. Filomena Pokorney L   Oxygenation stabilized normal on 4 L nasal cannula oxygen. This is an increase from his baseline of 2 L/m.   10:01 AM-Consult complete with TSB Resident. Patient case explained and discussed. She agrees to admit patient for further evaluation and treatment. Call ended at 10:15  CRITICAL CARE Performed by: Flint Melter Total critical care time: 35 minutes Critical care time was exclusive of separately billable procedures and treating other patients. Critical care was necessary to treat or prevent imminent or life-threatening deterioration. Critical care was time spent personally by me on the following activities: development of treatment plan with patient and/or surrogate as well as nursing, discussions with consultants, evaluation of patient's response to treatment, examination of patient, obtaining history from patient or surrogate, ordering and performing treatments and interventions, ordering and review of  laboratory studies, ordering and review of radiographic studies, pulse oximetry and re-evaluation of patient's condition.  Labs Review Labs Reviewed  BASIC METABOLIC PANEL - Abnormal; Notable for the following:    Chloride 97 (*)    Glucose, Bld 103 (*)    BUN 26 (*)    GFR calc non Af Amer 56 (*)    All other components within normal limits  CBC - Abnormal; Notable for the following:    RBC 3.95 (*)    Hemoglobin 9.8 (*)    HCT 34.8 (*)    MCH 24.8 (*)    MCHC 28.2 (*)    RDW 17.6 (*)    All other components within normal limits  BRAIN NATRIURETIC PEPTIDE    Imaging Review Dg Chest 2 View  05/06/2015  CLINICAL DATA:  Shortness of breath with cough EXAM: CHEST  2 VIEW COMPARISON:  May 09, 2014 chest radiograph and May 10, 2014 chest CT FINDINGS: There is underlying COPD with chronic peribronchial thickening. There is scarring in the right mid lung, inferior lingula, and left base regions. There is no frank edema or consolidation. The heart size is normal. Pulmonary vascularity is within normal limits and stable. There is no evidence of adenopathy. Bones are osteoporotic. There is anterior wedging of a mid thoracic vertebral body, stable. IMPRESSION: Areas of scarring with underlying COPD and chronic bronchitis. No frank edema or consolidation. No change in cardiac silhouette. Bones osteoporotic. Electronically Signed   By: Bretta Bang III M.D.   On: 05/06/2015 08:08   I have personally reviewed and evaluated these images and lab results as part of my medical decision-making.   EKG Interpretation   Date/Time:  Thursday May 06 2015 07:18:45 EDT Ventricular Rate:  107 PR Interval:  152 QRS Duration: 96 QT Interval:  358 QTC Calculation: 477 R Axis:   73 Text Interpretation:  Sinus tachycardia with Fusion complexes Low voltage  QRS Nonspecific ST abnormality Abnormal ECG Artifact Since last tracing  rate faster Confirmed by Tikesha Mort  MD, Rebekha Diveley ([id number]) on 05/06/2015 8:10:15   AM      MDM   Final diagnoses:  COPD exacerbation (HCC)  Hypoxia    COPD exacerbation, with increased oxygen requirement. Doubt ACS, metabolic instability or severe bacterial infection. He will require admission for stabilization. He may require consultation with pulmonary, for medication adjustment.   Nursing Notes Reviewed/ Care Coordinated, and agree without changes. Applicable Imaging Reviewed.  Interpretation of Laboratory Data incorporated into ED treatment   Plan: Admit    Mancel Bale, MD 05/06/15 1020

## 2015-05-07 DIAGNOSIS — J101 Influenza due to other identified influenza virus with other respiratory manifestations: Secondary | ICD-10-CM

## 2015-05-07 DIAGNOSIS — Z9981 Dependence on supplemental oxygen: Secondary | ICD-10-CM | POA: Diagnosis not present

## 2015-05-07 DIAGNOSIS — Z9849 Cataract extraction status, unspecified eye: Secondary | ICD-10-CM

## 2015-05-07 DIAGNOSIS — F418 Other specified anxiety disorders: Secondary | ICD-10-CM

## 2015-05-07 DIAGNOSIS — J441 Chronic obstructive pulmonary disease with (acute) exacerbation: Secondary | ICD-10-CM

## 2015-05-07 DIAGNOSIS — I10 Essential (primary) hypertension: Secondary | ICD-10-CM | POA: Diagnosis not present

## 2015-05-07 DIAGNOSIS — R140 Abdominal distension (gaseous): Secondary | ICD-10-CM | POA: Diagnosis present

## 2015-05-07 DIAGNOSIS — K219 Gastro-esophageal reflux disease without esophagitis: Secondary | ICD-10-CM

## 2015-05-07 DIAGNOSIS — D649 Anemia, unspecified: Secondary | ICD-10-CM

## 2015-05-07 DIAGNOSIS — Z87891 Personal history of nicotine dependence: Secondary | ICD-10-CM

## 2015-05-07 LAB — COMPREHENSIVE METABOLIC PANEL
ALT: 35 U/L (ref 17–63)
AST: 31 U/L (ref 15–41)
Albumin: 3.1 g/dL — ABNORMAL LOW (ref 3.5–5.0)
Alkaline Phosphatase: 64 U/L (ref 38–126)
Anion gap: 7 (ref 5–15)
BILIRUBIN TOTAL: 0.4 mg/dL (ref 0.3–1.2)
BUN: 20 mg/dL (ref 6–20)
CHLORIDE: 102 mmol/L (ref 101–111)
CO2: 33 mmol/L — ABNORMAL HIGH (ref 22–32)
CREATININE: 1.05 mg/dL (ref 0.61–1.24)
Calcium: 9 mg/dL (ref 8.9–10.3)
Glucose, Bld: 167 mg/dL — ABNORMAL HIGH (ref 65–99)
POTASSIUM: 4.3 mmol/L (ref 3.5–5.1)
Sodium: 142 mmol/L (ref 135–145)
TOTAL PROTEIN: 5.7 g/dL — AB (ref 6.5–8.1)

## 2015-05-07 LAB — IRON AND TIBC
IRON: 15 ug/dL — AB (ref 45–182)
Saturation Ratios: 4 % — ABNORMAL LOW (ref 17.9–39.5)
TIBC: 386 ug/dL (ref 250–450)
UIBC: 371 ug/dL

## 2015-05-07 LAB — MAGNESIUM: MAGNESIUM: 1.9 mg/dL (ref 1.7–2.4)

## 2015-05-07 LAB — TECHNOLOGIST SMEAR REVIEW

## 2015-05-07 LAB — CBC WITH DIFFERENTIAL/PLATELET
Basophils Absolute: 0 10*3/uL (ref 0.0–0.1)
Basophils Relative: 0 %
EOS PCT: 0 %
Eosinophils Absolute: 0 10*3/uL (ref 0.0–0.7)
HEMATOCRIT: 31.6 % — AB (ref 39.0–52.0)
Hemoglobin: 9.1 g/dL — ABNORMAL LOW (ref 13.0–17.0)
LYMPHS ABS: 0.2 10*3/uL — AB (ref 0.7–4.0)
LYMPHS PCT: 4 %
MCH: 25.5 pg — AB (ref 26.0–34.0)
MCHC: 28.8 g/dL — AB (ref 30.0–36.0)
MCV: 88.5 fL (ref 78.0–100.0)
MONO ABS: 0.2 10*3/uL (ref 0.1–1.0)
MONOS PCT: 3 %
NEUTROS ABS: 5.2 10*3/uL (ref 1.7–7.7)
Neutrophils Relative %: 93 %
PLATELETS: 241 10*3/uL (ref 150–400)
RBC: 3.57 MIL/uL — ABNORMAL LOW (ref 4.22–5.81)
RDW: 17.4 % — AB (ref 11.5–15.5)
WBC: 5.6 10*3/uL (ref 4.0–10.5)

## 2015-05-07 LAB — FOLATE: Folate: 15.8 ng/mL (ref >5.9)

## 2015-05-07 LAB — EXPECTORATED SPUTUM ASSESSMENT W GRAM STAIN, RFLX TO RESP C

## 2015-05-07 LAB — EXPECTORATED SPUTUM ASSESSMENT W REFEX TO RESP CULTURE

## 2015-05-07 LAB — APTT: APTT: 26 s (ref 24–37)

## 2015-05-07 LAB — PROTIME-INR
INR: 1.03 (ref 0.00–1.49)
PROTHROMBIN TIME: 13.7 s (ref 11.6–15.2)

## 2015-05-07 LAB — RETICULOCYTES
RBC.: 3.57 MIL/uL — AB (ref 4.22–5.81)
RETIC COUNT ABSOLUTE: 46.4 10*3/uL (ref 19.0–186.0)
RETIC CT PCT: 1.3 % (ref 0.4–3.1)

## 2015-05-07 LAB — VITAMIN B12: Vitamin B-12: 151 pg/mL — ABNORMAL LOW (ref 180–914)

## 2015-05-07 LAB — FERRITIN: FERRITIN: 17 ng/mL — AB (ref 24–336)

## 2015-05-07 MED ORDER — CETYLPYRIDINIUM CHLORIDE 0.05 % MT LIQD
7.0000 mL | Freq: Two times a day (BID) | OROMUCOSAL | Status: DC
  Administered 2015-05-07 – 2015-05-10 (×6): 7 mL via OROMUCOSAL

## 2015-05-07 MED ORDER — PREDNISONE 20 MG PO TABS
40.0000 mg | ORAL_TABLET | Freq: Every day | ORAL | Status: AC
  Administered 2015-05-08 – 2015-05-10 (×3): 40 mg via ORAL
  Filled 2015-05-07 (×3): qty 2

## 2015-05-07 MED ORDER — VITAMIN B-12 1000 MCG PO TABS
1000.0000 ug | ORAL_TABLET | Freq: Every day | ORAL | Status: DC
  Administered 2015-05-07 – 2015-05-10 (×4): 1000 ug via ORAL
  Filled 2015-05-07 (×4): qty 1

## 2015-05-07 MED ORDER — IPRATROPIUM-ALBUTEROL 0.5-2.5 (3) MG/3ML IN SOLN
3.0000 mL | Freq: Four times a day (QID) | RESPIRATORY_TRACT | Status: DC
  Administered 2015-05-08 – 2015-05-10 (×10): 3 mL via RESPIRATORY_TRACT
  Filled 2015-05-07 (×10): qty 3

## 2015-05-07 MED ORDER — SIMETHICONE 80 MG PO CHEW
80.0000 mg | CHEWABLE_TABLET | Freq: Four times a day (QID) | ORAL | Status: DC | PRN
  Administered 2015-05-07 – 2015-05-08 (×4): 80 mg via ORAL
  Filled 2015-05-07 (×4): qty 1

## 2015-05-07 MED ORDER — BUDESONIDE 0.25 MG/2ML IN SUSP
0.2500 mg | Freq: Two times a day (BID) | RESPIRATORY_TRACT | Status: DC
  Administered 2015-05-07 – 2015-05-09 (×5): 0.25 mg via RESPIRATORY_TRACT
  Filled 2015-05-07 (×4): qty 2

## 2015-05-07 NOTE — Progress Notes (Signed)
   Subjective: Patient is starting to feel better. He reports similar episodes in the past. In his experience, these have a sudden onset with gradual improvement.  Objective: Vital signs in last 24 hours: Filed Vitals:   05/07/15 0830 05/07/15 1312 05/07/15 1351 05/07/15 1739  BP:   111/81   Pulse:   103   Temp:   98.6 F (37 C)   TempSrc:      Resp:   20   Height:      Weight:      SpO2: 95% 95%  95%   Weight change:   Intake/Output Summary (Last 24 hours) at 05/07/15 1752 Last data filed at 05/07/15 1622  Gross per 24 hour  Intake    480 ml  Output    575 ml  Net    -95 ml   General: resting in bed, pleasant, no acute distress Cardiac: RRR, no rubs, murmurs or gallops Pulm: wheezing left lower lung field, otherwise clear Abd: soft, nontender, BS present Ext: warm and well perfused, no pedal edema Neuro: alert and oriented   Assessment/Plan: Principal Problem:   Acute on chronic respiratory failure with hypoxia (HCC) Active Problems:   Essential hypertension   GERD   On home oxygen therapy   COPD exacerbation (HCC)   Influenza B   Normocytic anemia   Cataract of right eye   Abdominal distension   Acute on chronic hypoxic respiratory failure in setting of GOLD Stage 3 COPD Exacerbation and Influenza B - Patient with subjective improvement today. Will continue current treatment and transition to oral steroids. -Maintain continuous pulse oximetry  -Oxygen therapy to keep SpO2 88-90% -Bipap as needed  -Tamiflu 75 mg po BID for 5 total days -f/u sputum culture and gram stain -Duoneb nebulizer Q 4 hr while awake and albuterol nebulizer 5 mg Q 2 hr PRN -d/c IV solumedrol 60 mg Q 6 hr >> transition to oral Prednisone 40 mg tomorrow -Azithromycin 500 mg for 3 days in setting of COPD flare (received IV levaquin 750 mg in ED) -Pulmicort nebulizer 0.25 mg BID (hold home dulera due to LABA) -Mucinex-DM BID for productive cough -Pt needs pulmonary rehab on discharge  and pulmonology follow-up for updated PFT's    Chronic Normocytic Anemia - Hg 9.1 which is below baseline 12. Pt denies active bleeding. Ferritin, Iron, and B12 are low. TIBC is in normal range. MCV is 88.5. 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 Reticulocyte count 46.4. Possibly from acute stress. May have combined microcytic and macrocytic anemia. -Check LDH, Coombs -Vitamin B12 po daily -Consider MM work-up with free light chains and SPEP if work-up unrevealing  Hypertension - Currently normotensive. -Continue home lasix 20 mg daily  Chronic anxiety - Currently with mild anxiety.  -Continue home xanax 0.5 mg TID PRN  GERD: -Continue home protonix 40 mg BID    Dispo: Disposition is deferred at this time, awaiting improvement of current medical problems.  Anticipated discharge in approximately 1-2 day(s).       Darreld McleanVishal Kingstyn Deruiter, MD 05/07/2015, 5:52 PM

## 2015-05-07 NOTE — Care Management Obs Status (Signed)
MEDICARE OBSERVATION STATUS NOTIFICATION   Patient Details  Name: [name] Morningddie Petruska Jr. MRN: [id number] Date of Birth: 1940/05/21   Medicare Observation Status Notification Given:  Yes    Lawerance SabalDebbie Tajha Sammarco, RN 05/07/2015, 11:50 AM

## 2015-05-08 DIAGNOSIS — I10 Essential (primary) hypertension: Secondary | ICD-10-CM | POA: Diagnosis not present

## 2015-05-08 DIAGNOSIS — Z9981 Dependence on supplemental oxygen: Secondary | ICD-10-CM | POA: Diagnosis not present

## 2015-05-08 DIAGNOSIS — J101 Influenza due to other identified influenza virus with other respiratory manifestations: Secondary | ICD-10-CM | POA: Diagnosis not present

## 2015-05-08 DIAGNOSIS — J441 Chronic obstructive pulmonary disease with (acute) exacerbation: Secondary | ICD-10-CM | POA: Diagnosis not present

## 2015-05-08 LAB — CBC
HCT: 33.3 % — ABNORMAL LOW (ref 39.0–52.0)
Hemoglobin: 9 g/dL — ABNORMAL LOW (ref 13.0–17.0)
MCH: 24.1 pg — ABNORMAL LOW (ref 26.0–34.0)
MCHC: 27 g/dL — AB (ref 30.0–36.0)
MCV: 89 fL (ref 78.0–100.0)
PLATELETS: 278 10*3/uL (ref 150–400)
RBC: 3.74 MIL/uL — ABNORMAL LOW (ref 4.22–5.81)
RDW: 17.4 % — AB (ref 11.5–15.5)
WBC: 9.5 10*3/uL (ref 4.0–10.5)

## 2015-05-08 LAB — BASIC METABOLIC PANEL
Anion gap: 8 (ref 5–15)
BUN: 22 mg/dL — AB (ref 6–20)
CALCIUM: 8.9 mg/dL (ref 8.9–10.3)
CO2: 35 mmol/L — ABNORMAL HIGH (ref 22–32)
CREATININE: 1.03 mg/dL (ref 0.61–1.24)
Chloride: 99 mmol/L — ABNORMAL LOW (ref 101–111)
GFR calc Af Amer: >60 mL/min (ref >60)
GLUCOSE: 151 mg/dL — AB (ref 65–99)
Potassium: 3.8 mmol/L (ref 3.5–5.1)
Sodium: 142 mmol/L (ref 135–145)

## 2015-05-08 LAB — HEPATITIS C ANTIBODY (REFLEX)

## 2015-05-08 LAB — LACTATE DEHYDROGENASE: LDH: 207 U/L — ABNORMAL HIGH (ref 98–192)

## 2015-05-08 LAB — DIRECT ANTIGLOBULIN TEST (NOT AT ARMC)
DAT, IgG: NEGATIVE
DAT, complement: NEGATIVE

## 2015-05-08 LAB — HCV COMMENT:

## 2015-05-08 MED ORDER — POLYETHYLENE GLYCOL 3350 17 G PO PACK
17.0000 g | PACK | Freq: Every day | ORAL | Status: DC
  Filled 2015-05-08 (×3): qty 1

## 2015-05-08 NOTE — Progress Notes (Signed)
Patient ID: [name] Morningddie Crisanto Jr., male   DOB: 1940/06/07, 75 y.o.   MRN: [id number] Medicine attending: Clinical database reviewed and status discussed with resident physician Dr. Heywood Ilesushil Patel. I concur with his evaluation and management plan. No acute change. Slowly improving. Continue current in hospital management. He has influenza B with a background of advanced, oxygen dependent, obstructive airway disease. He is at high risk for complications.

## 2015-05-08 NOTE — Evaluation (Signed)
Physical Therapy Evaluation Patient Details Name: [name] Morningddie Laursen Jr. MRN: [id number] DOB: [birth year] Today's Date: 05/08/2015   History of Present Illness  Pt is a 75 y.o. male admitted with acute on chronic respiratory failure. He tested positive for infuenza B. PMH consists of advance O2-dependent COPD, HTN, reflux, and DVT.  Clinical Impression  Pt admitted with above diagnosis. Pt currently with functional limitations due to the deficits listed below (see PT Problem List). On eval, pt mod independent with bed mobility, supervision with transfers, and supervision with ambulation in room. Ambulation distance limited by cardiopulmonary status. HR at 127 and O2 sats 69% after 35 feet with continuous O2. Pt will benefit from skilled PT to increase their independence and safety with mobility to allow discharge to the venue listed below.  PT to follow in acute care. No follow up services recommended at d/c.     Follow Up Recommendations No PT follow up;Supervision - Intermittent    Equipment Recommendations  None recommended by PT    Recommendations for Other Services       Precautions / Restrictions Precautions Precautions: Other (comment) Precaution Comments: Droplet      Mobility  Bed Mobility Overal bed mobility: Modified Independent                Transfers Overall transfer level: Needs assistance Equipment used: None Transfers: Sit to/from BJ'sStand;Stand Pivot Transfers Sit to Stand: Supervision Stand pivot transfers: Supervision          Ambulation/Gait Ambulation/Gait assistance: Supervision Ambulation Distance (Feet): 35 Feet Assistive device: None Gait Pattern/deviations: Step-through pattern;Decreased stride length Gait velocity: decreased Gait velocity interpretation: Below normal speed for age/gender General Gait Details: Pt on O2 at 4 L during mobility. After ambulating 35 feet, O2 sats 69% and HR 127. Pt sat EOB x 2 minutes, then returned to supine. EOB  sats climbed to 82%, then 95% with return to supine with HR at 60.  Stairs            Wheelchair Mobility    Modified Rankin (Stroke Patients Only)       Balance Overall balance assessment: No apparent balance deficits (not formally assessed)                                           Pertinent Vitals/Pain Pain Assessment: No/denies pain    Home Living Family/patient expects to be discharged to:: Private residence Living Arrangements: Alone Available Help at Discharge: Family;Available PRN/intermittently Type of Home: House Home Access: Stairs to enter Entrance Stairs-Rails: Left Entrance Stairs-Number of Steps: 3 Home Layout: One level Home Equipment: Shower seat - built in;Other (comment) Additional Comments: home O2    Prior Function Level of Independence: Independent         Comments: still drives     Hand Dominance   Dominant Hand: Right    Extremity/Trunk Assessment   Upper Extremity Assessment: Defer to OT evaluation           Lower Extremity Assessment: Generalized weakness      Cervical / Trunk Assessment: Normal  Communication   Communication: No difficulties  Cognition Arousal/Alertness: Awake/alert Behavior During Therapy: WFL for tasks assessed/performed Overall Cognitive Status: Within Functional Limits for tasks assessed                      General Comments      Exercises  Assessment/Plan    PT Assessment Patient needs continued PT services  PT Diagnosis Generalized weakness;Difficulty walking   PT Problem List Decreased strength;Decreased activity tolerance;Decreased mobility;Cardiopulmonary status limiting activity  PT Treatment Interventions Gait training;Stair training;Functional mobility training;Therapeutic activities;Therapeutic exercise;Patient/family education   PT Goals (Current goals can be found in the Care Plan section) Acute Rehab PT Goals Patient Stated Goal: home PT  Goal Formulation: With patient Time For Goal Achievement: 05/22/15 Potential to Achieve Goals: Good    Frequency Min 3X/week   Barriers to discharge        Co-evaluation               End of Session   Activity Tolerance: Patient tolerated treatment well Patient left: in bed;with call bell/phone within reach Nurse Communication: Mobility status    Functional Assessment Tool Used: clinical judgement Functional Limitation: Mobility: Walking and moving around Mobility: Walking and Moving Around Current Status (Z6109): At least 1 percent but less than 20 percent impaired, limited or restricted Mobility: Walking and Moving Around Goal Status [phone]): 0 percent impaired, limited or restricted    Time: 0981-1914 PT Time Calculation (min) (ACUTE ONLY): 11 min   Charges:   PT Evaluation $PT Eval Moderate Complexity: 1 Procedure     PT G Codes:   PT G-Codes **NOT FOR INPATIENT CLASS** Functional Assessment Tool Used: clinical judgement Functional Limitation: Mobility: Walking and moving around Mobility: Walking and Moving Around Current Status (N8295): At least 1 percent but less than 20 percent impaired, limited or restricted Mobility: Walking and Moving Around Goal Status [phone]): 0 percent impaired, limited or restricted    Ilda Foil 05/08/2015, 12:58 PM

## 2015-05-08 NOTE — Progress Notes (Signed)
   Subjective: He reports having difficulties getting his breathing treatment overnight but is feeling better overall. He would like to ambulate to the bathroom today to test his breathing.   Objective: Vital signs in last 24 hours: Filed Vitals:   05/08/15 0523 05/08/15 0524 05/08/15 0812 05/08/15 0815  BP:  121/72    Pulse:  89    Temp:  98.4 F (36.9 C)    TempSrc:  Oral    Resp:  18    Height:      Weight: 183 lb 10.3 oz (83.3 kg)     SpO2:  96% 94% 94%   Weight change: -1 lb 5.7 oz (-0.615 kg)  Intake/Output Summary (Last 24 hours) at 05/08/15 0949 Last data filed at 05/08/15 0913  Gross per 24 hour  Intake    440 ml  Output    950 ml  Net   -510 ml   General: elderly Caucasian male, resting in bed, pleasant, no acute distress Cardiac: regular rate, no rubs, murmurs or gallops Pulm: mild wheezing throughout all lung fields Abd: soft, nontender, BS present Ext: warm and well perfused, no pedal edema Neuro: alert and oriented x 3, responds to questions appropriately  Assessment/Plan: Principal Problem:   Acute on chronic respiratory failure with hypoxia (HCC) Active Problems:   Essential hypertension   GERD   On home oxygen therapy   COPD exacerbation (HCC)   Influenza B   Normocytic anemia   Cataract of right eye   Abdominal distension  Acute on chronic hypoxic respiratory failure in setting of GOLD Stage 3 COPD Exacerbation and Influenza B - Patient with subjective improvement today. Will continue current treatment and transition to oral steroids. Sputum culture and gram stain unremarkable since 3/31.  -Maintain continuous pulse oximetry  -Continue supplemental oxygen therapy to keep SpO2 88-90% -Tamiflu 75 mg po BID for 5 total days -Continue Duoneb nebulizer q6h while awake and albuterol nebulizer 5 mg Q 2 hr PRN. Encouraged patient to ask for prn nebulizer treatments if he is symptomatic. -Continue oral Prednisone 40 mg [Day 3/5] -Continue azithromycin  500 mg [Day 3/5]  -Continue Pulmicort nebulizer 0.25 mg BID (hold home dulera due to LABA) -Continue Mucinex-DM BID for productive cough -Refer to pulmonary rehab on discharge and pulmonology follow-up for updated PFT's   Chronic Normocytic Anemia - Hb 9 which is below baseline 12. Possibly from acute stress or mixed anemia from combined microcytic and macrocytic anemia. Reticulocyte index 0.69 suggestive of inadequate marrow response. No active bleeding. Low Ferritin 17, Iron 15, and B12 151. TIBC is in normal range. MCV is 88.5. 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 LDH mildly elevated at 207, Coombs negative which is less suggestive of hemolytic process. -Continue Vitamin B12 po daily -Consider MM work-up with free light chains and SPEP if work-up un revealing  Hypertension - Currently normotensive. Unclear why he is on Lasix as twice daily dosing would be indicated for blood pressure control.  -Discontinue home lasix 20 mg daily  Chronic anxiety - Currently with mild anxiety.  -Continue home xanax 0.5 mg TID PRN  GERD: -Continue home protonix 40 mg BID  Dispo: Disposition is deferred at this time, awaiting improvement of current medical problems.  Anticipated discharge in approximately 1-2 day(s).    Beather Arbourushil Patel V, MD 05/08/2015, 9:49 AM

## 2015-05-09 DIAGNOSIS — J101 Influenza due to other identified influenza virus with other respiratory manifestations: Secondary | ICD-10-CM | POA: Diagnosis not present

## 2015-05-09 DIAGNOSIS — Z9981 Dependence on supplemental oxygen: Secondary | ICD-10-CM | POA: Diagnosis not present

## 2015-05-09 DIAGNOSIS — J441 Chronic obstructive pulmonary disease with (acute) exacerbation: Secondary | ICD-10-CM | POA: Diagnosis not present

## 2015-05-09 DIAGNOSIS — I10 Essential (primary) hypertension: Secondary | ICD-10-CM | POA: Diagnosis not present

## 2015-05-09 LAB — CULTURE, RESPIRATORY W GRAM STAIN: Culture: NORMAL

## 2015-05-09 LAB — CULTURE, RESPIRATORY

## 2015-05-09 MED ORDER — MOMETASONE FURO-FORMOTEROL FUM 200-5 MCG/ACT IN AERO
2.0000 | INHALATION_SPRAY | Freq: Two times a day (BID) | RESPIRATORY_TRACT | Status: DC
  Administered 2015-05-09 – 2015-05-10 (×2): 2 via RESPIRATORY_TRACT
  Filled 2015-05-09: qty 8.8

## 2015-05-09 MED ORDER — ALUM & MAG HYDROXIDE-SIMETH 200-200-20 MG/5ML PO SUSP
30.0000 mL | ORAL | Status: DC | PRN
  Administered 2015-05-09 – 2015-05-10 (×3): 30 mL via ORAL
  Filled 2015-05-09 (×3): qty 30

## 2015-05-09 NOTE — Progress Notes (Signed)
Patient ID: [name] Morningddie Khun Jr., male   DOB: 02-03-1941, 75 y.o.   MRN: [id number] Medicine attending: I examined this patient this morning together with resident physician Dr. Heywood Ilesushil Patel. I concur with his evaluation and management plan which we discussed together. His breathing is improving and close to his baseline. Still with scattered wheezes. He continues on a five-day course of azithromycin for exacerbation of COPD which will be completed tomorrow. He continues on Flumadine for influenza B diagnosed at time of this admission. We anticipate he'll be stable for discharge late tomorrow.

## 2015-05-09 NOTE — Progress Notes (Signed)
OT NOTE  Patient requesting to use nebulizer "Dulera 200 mcg". Pt reports breathing treatments are not as affective as the hand held nebulizer he uses at home. MD please address with patient.    [name] [name], [name] [name]   OTR/L Pager: [phone] Office: [phone] .
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
## 2015-05-09 NOTE — Progress Notes (Signed)
   Subjective: He continues to feel better today and would like to go home though wanted to stay one more day to finish his treatment. He feels his breathing is getting better to his normal state.   Objective: Vital signs in last 24 hours: Filed Vitals:   05/08/15 1549 05/08/15 2216 05/09/15 0550 05/09/15 0757  BP:  129/66 112/97   Pulse:  97 93   Temp:  98.7 F (37.1 C) 98.7 F (37.1 C)   TempSrc:  Oral Oral   Resp:  18 16   Height:      Weight:   183 lb 3.2 oz (83.1 kg)   SpO2: 94% 92% 94% 95%   Weight change: -7.1 oz (-0.2 kg)  Intake/Output Summary (Last 24 hours) at 05/09/15 1100 Last data filed at 05/09/15 0316  Gross per 24 hour  Intake      0 ml  Output    800 ml  Net   -800 ml   General: elderly Caucasian male, resting in bed, pleasant, no acute distress Cardiac: regular rate, no rubs, murmurs or gallops Pulm: mild wheezing throughout all lung fields albeit improved from yesterday Abd: soft, nontender, BS present Ext: warm and well perfused, no pedal edema Neuro: alert and oriented x 3, responds to questions appropriately  Assessment/Plan: Principal Problem:   Acute on chronic respiratory failure with hypoxia (HCC) Active Problems:   Essential hypertension   GERD   On home oxygen therapy   COPD exacerbation (HCC)   Influenza B   Normocytic anemia   Cataract of right eye   Abdominal distension  Acute on chronic hypoxic respiratory failure in setting of GOLD Stage 3 COPD Exacerbation and Influenza B - Continued improvement today. Sputum culture and gram stain unremarkable since 3/31.  -Maintain continuous pulse oximetry  -Continue supplemental oxygen therapy to keep SpO2 88-90% -Continue Tamiflu 75 mg po BID [Day 4/5] -Continue Duoneb nebulizer q6h while awake and albuterol nebulizer 5 mg Q 2 hr PRN.  -Continue oral Prednisone 40 mg [Day 4/5] -Continue azithromycin 500 mg [Day 4/5]  -Continue home Dulera instead of Pulmicort per patient  preference -Continue Mucinex-DM BID for productive cough -Refer to pulmonary rehab on discharge and pulmonology follow-up for updated PFT's   Chronic Normocytic Anemia - Hb 9 which is below baseline 12. Possibly from acute stress or mixed anemia from combined microcytic and macrocytic anemia. Reticulocyte index 0.69 suggestive of inadequate marrow response. No active bleeding. Low Ferritin 17, Iron 15, and B12 151. TIBC is in normal range. MCV is 88.5. 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 LDH mildly elevated at 207, Coombs negative which is less suggestive of hemolytic process. -Continue Vitamin B12 po daily  Hypertension - Currently normotensive off all meds.   Chronic anxiety - Continue home xanax 0.5 mg TID PRN  GERD: Continue home protonix 40 mg BID  Dispo: Disposition is deferred at this time, awaiting improvement of current medical problems.  Anticipated discharge in approximately 1-2 day(s).    Beather Arbourushil Nizar Cutler V, MD 05/09/2015, 11:00 AM

## 2015-05-10 DIAGNOSIS — D509 Iron deficiency anemia, unspecified: Secondary | ICD-10-CM | POA: Diagnosis present

## 2015-05-10 DIAGNOSIS — J441 Chronic obstructive pulmonary disease with (acute) exacerbation: Secondary | ICD-10-CM | POA: Diagnosis not present

## 2015-05-10 DIAGNOSIS — I10 Essential (primary) hypertension: Secondary | ICD-10-CM | POA: Diagnosis not present

## 2015-05-10 DIAGNOSIS — Z9981 Dependence on supplemental oxygen: Secondary | ICD-10-CM | POA: Diagnosis not present

## 2015-05-10 DIAGNOSIS — J101 Influenza due to other identified influenza virus with other respiratory manifestations: Secondary | ICD-10-CM | POA: Diagnosis not present

## 2015-05-10 MED ORDER — OSELTAMIVIR PHOSPHATE 75 MG PO CAPS
75.0000 mg | ORAL_CAPSULE | Freq: Two times a day (BID) | ORAL | Status: DC
  Filled 2015-05-10: qty 1

## 2015-05-10 MED ORDER — FERROUS SULFATE 325 (65 FE) MG PO TABS: 325.0000 mg | ORAL_TABLET | Freq: Every day | ORAL | Status: DC

## 2015-05-10 MED ORDER — OSELTAMIVIR PHOSPHATE 75 MG PO CAPS: 75.0000 mg | ORAL_CAPSULE | Freq: Two times a day (BID) | ORAL | Status: DC

## 2015-05-10 MED ORDER — DM-GUAIFENESIN ER 30-600 MG PO TB12
1.0000 | ORAL_TABLET | Freq: Two times a day (BID) | ORAL | Status: AC
Start: 2015-05-10 — End: ?

## 2015-05-10 MED ORDER — CYANOCOBALAMIN 1000 MCG PO TABS: 1000.0000 ug | ORAL_TABLET | Freq: Every day | ORAL | Status: DC

## 2015-05-10 NOTE — Progress Notes (Signed)
Occupational Therapy Treatment Patient Details Name: [name] Morningddie Muscat Jr. MRN: [id number] DOB: [birth year] Today's Date: 05/10/2015    History of present illness Pt is a 75 y.o. male admitted with acute on chronic respiratory failure. He tested positive for infuenza B. PMH consists of advance O2-dependent COPD, HTN, reflux, and DVT.   OT comments  Patient making good, steady progress towards OT goals. Continue plan of care for now. See below under ADL comment for information regarding session, including patient's drop in 02 sats after ADL.   Follow Up Recommendations  No OT follow up    Equipment Recommendations  None recommended by OT    Recommendations for Other Services  None at this time   Precautions / Restrictions Precautions Precautions: Other (comment) Precaution Comments: Droplet Restrictions Weight Bearing Restrictions: No    Mobility Bed Mobility Overal bed mobility: Modified Independent Transfers Overall transfer level: Needs assistance Equipment used: None Transfers: Sit to/from Stand Sit to Stand: Supervision General transfer comment: noted drop in oxygen, minimal/minor LOB that pt was able to self-correct    Balance Overall balance assessment: Needs assistance Sitting-balance support: No upper extremity supported;Feet supported Sitting balance-Leahy Scale: Normal     Standing balance support: No upper extremity supported;During functional activity Standing balance-Leahy Scale: Good Standing balance comment: minor LOB during functional mobility, pt able to self correct   ADL Overall ADL's : Needs assistance/impaired Eating/Feeding: Sitting;Set up   Grooming: Supervision/safety;Standing Grooming Details (indicate cue type and reason): at sink Upper Body Bathing: Set up;Sitting   Lower Body Bathing: Minimal assistance;Sit to/from stand   Upper Body Dressing : Set up;Sitting   Lower Body Dressing: Minimal assistance;Sit to/from stand   Toilet Transfer:  Supervision/safety;Ambulation;Comfort height toilet;Grab bars   Toileting- Clothing Manipulation and Hygiene: Supervision/safety;Sit to/from stand;Sitting/lateral lean     Tub/Shower Transfer Details (indicate cue type and reason): did not occur   General ADL Comments: Continued education on energy conservation techniques, encouraged pt to use these techniques in everyday self-care tasks. Pt ambulated to/from BR, stood at sink for grooming tasks, then sat back on EOB. Patients sats decreased to 78% after activity on 2L/min supplemental 02 and took ~3 minutes with seated rest break & pursed lip breathing for sats to increase to 91%.       Vision Additional Comments: recent cataract sugery on R eye 3/29 and pending L eye on 4/5          Cognition   Behavior During Therapy[facility] assessed/performed Overall Cognitive Status: Within Functional Limits for tasks assessed                 Pertinent Vitals/ Pain       Pain Assessment: No/denies pain   Frequency Min 2X/week     Progress Toward Goals  OT Goals(current goals can now befound in the care plan section)  Progress towards OT goals: Progressing toward goals  Acute Rehab OT Goals Patient Stated Goal: none stated OT Goal Formulation: With patient Time For Goal Achievement: 05/23/15 Potential to Achieve Goals: Good  Plan Discharge plan remains appropriate    Activity Tolerance Patient tolerated treatment well  Patient Left in bed;with call bell/phone within reach (seated EOB with doctors entering room)    Time: [id number] OT Time Calculation (min): 17 min  Charges: OT General Charges $OT Visit: 1 Procedure OT Treatments $Self Care/Home Management : 8-22 mins  Edwin CapPatricia Neilson Oehlert , MS, OTR/L, CLT Pager: [phone]  05/10/2015, 11:06 AM

## 2015-05-10 NOTE — Progress Notes (Signed)
Patient ID: [name] Morningddie Garcialopez Jr., male   DOB: 01/08/1941, 75 y.o.   MRN: [id number] Medicine attending discharge note: I personally examined this patient on the day of discharge and I attest to the accuracy of the discharge evaluation and plan as recorded by resident physician Dr. Ruben ImJeremy Ford in his progress note dated 05/10/2015 except for any changes as noted below. His formal discharge summary still not finalized.  Clinical summary: 75 year old man with advanced oxygen dependent obstructive airway disease, FEV1 36%, former smoker who stopped 17 years ago. He presented with a four-day history of initially dry cough which then became productive, increasing dyspnea, wheezing, and chest tightness. On presentation to the emergency department, blood pressure 111/69, pulse 105, temperature 98.8, respirations 24, oxygen saturation 82% on 2 L of oxygen. Marland Kitchen. He was in mild respiratory distress. Decreased air movement bilaterally with scattered wheezes and rhonchi. A chest radiograph, which I  personally reviewed, showed hyperinflated lungs consistent with COPD but no acute infiltrate or effusion. White blood count normal at 8700. He tested positive for influenza B. He was started on antibiotics in the emergency department with Levaquin. Oxygen and bronchodilators continued. Parenteral steroids administered.  Hospital course: Oxygen, bronchodilators, and steroids were continued. A 5 day course of azithromycin administered for acute exacerbation of obstructive airway disease. A seven-day course of Tamiflu initiated. He made slow but steady progress. He was breathing comfortably by time of discharge and back to his home level of oxygen. Lungs clear with no wheezing. He has developed progressive anemia over the last 2 years. Most recent recorded hemoglobin prior to this admission was from April 2016 when hemoglobin was 11.3, MCV 96. At time of current admission hemoglobin 9.8 and drifted down to 9.0 by 05/08/2015. Iron  studies were done and showed that he is iron deficient with serum iron 15, iron saturation 4%, and ferritin 17. Total bilirubin 0.4, reticulocyte count 1.3%, LDH borderline elevated at 207, Coombs' test negative.  His anemia will need further evaluation which can be done as an outpatient. Endoscopic procedures will be a challenge in view of his oxygen-dependent lung disease.  Disposition: Condition stable at time of discharge Follow-up with his primary care physician There were no complications

## 2015-05-10 NOTE — Discharge Summary (Signed)
Name: [name] Morningddie Maya Jr. MRN: [id number] DOB: October 28, 1940 75 y.o. PCP: Marva PandaKimberly Millsaps, NP  Date of Admission: 05/06/2015  7:57 AM Date of Discharge: 05/10/2015 Attending Physician: Cephas DarbyJames Granfortuna, MD  Discharge Diagnosis: 1. Acute on Chronic Respiratory Failure 2. Influenza B 3. Normocytic Anemia 4. GERD 5. HTN  Discharge Medications:   Medication List    STOP taking these medications        aspirin 325 MG tablet     BAYER BACK & BODY PAIN EX ST 500-32.5 MG Tabs  Generic drug:  Aspirin-Caffeine      TAKE these medications        albuterol 108 (90 Base) MCG/ACT inhaler  Commonly known as:  PROVENTIL HFA;VENTOLIN HFA  Inhale 2 puffs into the lungs every 6 (six) hours as needed for wheezing or shortness of breath.     ALPRAZolam 0.5 MG tablet  Commonly known as:  XANAX  Take 0.5 mg by mouth 3 (three) times daily as needed for anxiety.     ANTACID PO  Take by mouth.     cyanocobalamin 1000 MCG tablet  Take 1 tablet (1,000 mcg total) by mouth daily.     dextromethorphan-guaiFENesin 30-600 MG 12hr tablet  Commonly known as:  MUCINEX DM  Take 1 tablet by mouth 2 (two) times daily.     ferrous sulfate 325 (65 FE) MG tablet  Take 1 tablet (325 mg total) by mouth daily with breakfast.  Start taking on:  05/11/2015     furosemide 40 MG tablet  Commonly known as:  LASIX  Take 20 mg by mouth daily.     ipratropium-albuterol 0.5-2.5 (3) MG/3ML Soln  Commonly known as:  DUONEB  Take 3 mLs by nebulization every 6 (six) hours as needed.     mometasone-formoterol 200-5 MCG/ACT Aero  Commonly known as:  DULERA  Inhale 2 puffs into the lungs 2 (two) times daily.     montelukast 10 MG tablet  Commonly known as:  SINGULAIR  Take 1 tablet (10 mg total) by mouth at bedtime.     ofloxacin 0.3 % ophthalmic solution  Commonly known as:  OCUFLOX  Place 1 drop into the right eye 2 (two) times daily.     pantoprazole 40 MG tablet  Commonly known as:  PROTONIX  Take 1  tablet (40 mg total) by mouth 2 (two) times daily.     PROLENSA 0.07 % Soln  Generic drug:  Bromfenac Sodium  Place 1 drop into the right eye daily.     sodium chloride 0.65 % Soln nasal spray  Commonly known as:  OCEAN  Place 1 spray into the nose as needed for congestion (Dry nasal passages).     SPIRIVA HANDIHALER 18 MCG inhalation capsule  Generic drug:  tiotropium  PLACE 1 CAPSULE (18 MCG TOTAL) INTO INHALER AND INHALE DAILY.        Disposition and follow-up:   [name] Pierce CraneLashley Jr. was [facility] in [facility].  At the hospital follow up visit please address:  1.  [name] [name] was admitted for a COPD exacerbation incited by an Influenza B infection. He was appropriately treated with steroids and Tamiflu, which were completed prior to discharge. He was discharged on his home oxygen requirement  [facility](2LNC) and his home regimen. Patient will need PFTs  2. He has developed normocytic anemia over the last 2 years.  Hgb in 05/2014 was 11.3, MCV 96. One year later, during his current admission, Hgb was  9.8 on admission and drifted down to 9.0 by 05/08/2015. Iron studies were consistent with iron deficiency anemia. His anemia will need further evaluation which can be done as an outpatient. This may include a colonoscopy. However, Endoscopic procedures will be a challenge in view of his oxygen-dependent lung disease.  3.  Labs / imaging needed at time of follow-up: CBC  4.  Pending labs/ test needing follow-up: None  Follow-up Appointments: Follow-up Information    Follow up with Millsaps, Joelene Millin, NP. Go on 05/18/2015.   Why:  at 1:00 PM   Contact information:   [facility] [address] [place] [address] [phone]       Call [facility].   Specialty:  Pulmonology   Why:  To schedule pulmonary function testing.   Contact information:   [address] [place] [address] [phone]        Discharge Instructions: Discharge Instructions    Ambulatory referral to Pulmonology    Complete by:  As directed   No PFTs on file. Patient needs PFTs.     Diet - low sodium heart healthy    Complete by:  As directed      Increase activity slowly    Complete by:  As directed            Procedures Performed:  Dg Chest 2 View  05/06/2015  CLINICAL DATA:  Shortness of breath with cough EXAM: CHEST  2 VIEW COMPARISON:  May 09, 2014 chest radiograph and May 10, 2014 chest CT FINDINGS: There is underlying COPD with chronic peribronchial thickening. There is scarring in the right mid lung, inferior lingula, and left base regions. There is no frank edema or consolidation. The heart size is normal. Pulmonary vascularity is within normal limits and stable. There is no evidence of adenopathy. Bones are osteoporotic. There is anterior wedging of a mid thoracic vertebral body, stable. IMPRESSION: Areas of scarring with underlying COPD and chronic bronchitis. No frank edema or consolidation. No change in cardiac silhouette. Bones osteoporotic. Electronically Signed   By: Bretta Bang III M.D.   On: 05/06/2015 08:08   Dg Abd 1 View  05/06/2015  CLINICAL DATA:  75 year old male with abdominal distention for 2 days. Shortness of breath and cough. Initial encounter. EXAM: ABDOMEN - 1 VIEW COMPARISON:  05/10/2014 and earlier. FINDINGS: Supine view of the abdomen. Non obstructed bowel gas pattern. No definite pneumoperitoneum. Abdominal and pelvic visceral contours appear stable. Stable pelvic phleboliths. Minimal visualization of the left lung base appears stable. Chronic appearing L1 compression fracture. Moderate to severe chronic facet joint degeneration at L5-S1. No acute osseous abnormality identified. IMPRESSION: Normal bowel gas pattern.  No acute findings. Electronically Signed   By: Odessa Fleming M.D.   On: 05/06/2015 13:26     Admission HPI: [name] [name] is a very pleasant 75 year old  man with past medical history of Gold Stage 3 COPD on home oxygen, hypertension, chronic migraine headaches, and GERD who presents with shortness of breath.  He reports feeling in his normal state of health until 4 days ago when he began to have initially dry cough which then turned into yellow-colored phlegm in addition to wheezing and chest tightness. Last night he became short of breath with SpO2 in low 90's at home. He is on 2L of oxygen 24 hrs a day for over a year. He has been using his duoneb nebulizer machine every 4 hrs for the past few days  with no relief. He has been compliant with taking dulera twice a day but is no longer on spiriva. He denies fever, chills, URI symptoms, weakness, LE edema, nausea, vomiting, abdominal pain, or change in BM/urination. He denies sick contacts. He reports having COPD flare every 3-4 months in the past year with last one 3 months ago. He reports he has been intubated in the past. He is a former smoker with 0.5 ppd use for 15-20 years and quit about 17 years ago. He can only walk 60 -70 ft without becoming short of breath. He last saw his pulmonologist Dr. Delford Field one year ago. His last PFT's on 03/20/11 revealed Stage 3 COPD with FEV1 of 36%. He has had PCV13 vaccination and flu shot this year. He has never had pulmonary rehab. Of note he had right cataract surgery yesterday.  On admission to the ED he was hypoxic to 82% on 2L of oxygen requiring uptitration to 4L. Chest xray revealed COPD with no pneumonia or pulmonary edema. He was given breathing treatment, IV solumedrol, IVF's, and levaquin. He does not feel that his breathing has improved.    Hospital Course by problem list:   Acute on Chronic Respiratory Failure secondary to Influenza B Infection: Patient was treated IV solumedrol, levaquin and azithromycin, Tamiflu, Duonebs, and prn albuterol nebulizer, and he symptomatically improved over the following days. 2-View CXR was not appreciably different from 2016  CXR. He was initially hypoxic to 82% on 2LNC, requiring 4LNC to reach goal of SpO2 88-90%. Antibiotics were discontinued after he was found to be flu positive. His oxygen requirements decreased and was on his home O2 requirements [facility]) by discharge. On day of discharge, he only had mild scattered wheezes on exam, much improved from previous exams.   Normocytic Anemia: He has developed progressive anemia over the last 2 years. Most recent recorded hemoglobin prior to this admission was from April 2016 when hemoglobin was 11.3, MCV 96. At time of current admission hemoglobin 9.8 and drifted down to 9.0 by 05/08/2015. Iron studies were done and showed that he is iron deficient with serum iron 15, iron saturation 4%, and ferritin 17. Total bilirubin 0.4, reticulocyte count 1.3%, LDH borderline elevated at 207, Coombs' test negative. He was started on ferrous sulfate 325 mg daily. This will require further workup as an outpatient and may require a colonoscopy if he can tolerate it.   GERD: Continued on protonix 40 mg daily.  HTN: Normotensive off all medications. Only furosemide 20 mg daily resumed on discharge.  Discharge Vitals:   BP 137/77 mmHg  Pulse 97  Temp(Src) 98.1 F (36.7 C) (Oral)  Resp 20  Ht  (1.6 m)  Wt 185 lb 6.5 oz (84.1 kg)  BMI 32.85 kg/m2  SpO2 97%  Discharge Labs:  No results found for this or any previous visit (from the past 24 hour(s)).  Signed: Ruben Im, MD 05/10/2015, 1:33 PM

## 2015-05-10 NOTE — Progress Notes (Signed)
   Subjective:  [name] [name] told us he was breathing much better this morning. He reported some earlier difficulty breathing that he attributed to his oxygen being too high. He indicates he feels comfortable with discharge today.  Objective: Vital signs in last 24 hours: Filed Vitals:   05/09/15 2036 05/09/15 2037 05/09/15 2157 05/10/15 0530  BP:   122/69 137/77  Pulse:   95 96  Temp:   98.2 F (36.8 C) 98.1 F (36.7 C)  TempSrc:    Oral  Resp:   18 22  Height:      Weight:    185 lb 6.5 oz (84.1 kg)  SpO2: 96% 95% 96% 97%   Weight change: 2 lb 3.3 oz (1 kg)  Intake/Output Summary (Last 24 hours) at 05/10/15 0848 Last data filed at 05/09/15 2156  Gross per 24 hour  Intake    320 ml  Output    650 ml  Net   -330 ml   General: Sitting up in bed eating breakfast. NAD. Cardiac: regular rate, no rubs, murmurs or gallops Pulm: Mild scattered wheezes throughout Abd: soft, nontender, BS present Ext: warm and well perfused, no pedal edema Neuro: Responds to questions appropriately. Tongue midline, face symmetric.  Assessment/Plan:  Acute on chronic hypoxic respiratory failure in setting of GOLD Stage 3 COPD Exacerbation and Influenza B: Patient was superceding his SpO2 goal of 88-90% on 3L. I turned this down to 2L this AM, which is his home requirement. -Continue supplemental oxygen therapy to keep SpO2 88-90% -FinishTamiflu 75 mg po BID [Day 5/5] -Continue Duoneb nebulizer q6h while awake and albuterol nebulizer 5 mg Q 2 hr PRN.  -Completed oral Prednisone 40 mg today -Continue home Dulera  -Continue Mucinex-DM BID for productive cough -Refer to pulmonary rehab on discharge and pulmonology follow-up for updated PFT's   Chronic Normocytic Anemia - Hb 9 which is below baseline 12. Possibly from acute stress or mixed anemia from combined microcytic and macrocytic anemia. Reticulocyte index 0.69 suggestive of inadequate marrow response. No active bleeding. Low Ferritin 17, Iron  15, and B12 151. TIBC is in normal range. MCV is 88.5. 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 LDH mildly elevated at 207, Coombs negative which is less suggestive of hemolytic process. -Continue Vitamin B12 po daily  Hypertension - Currently normotensive off all meds.   Chronic anxiety - Continue home xanax 0.5 mg TID PRN  GERD: Continue home protonix 40 mg BID  Dispo: Anticipated discharge today   Ruben ImJeremy Drevon Plog, MD 05/10/2015, 8:48 AM

## 2015-05-10 NOTE — Discharge Instructions (Signed)
Mr. [name] [name], it was a a pleasure taking care of you in the hospital. You had a flare of your COPD from getting the flu. We treated both your flu and COPD with Tamiflu and steroids. You can go back to your home COPD regimen. It may take a week or so before you're back to normal.   You were also found to be anemic. This will require further workup, which may include a colonoscopy. You will be given ferrous sulfate (iron supplement) daily to address this.   Please follow up with your primary doctor as outlined in your discharge instructions.

## 2015-05-10 NOTE — Discharge Instructions (Signed)
Mr. [name] [name], it was a pleasure taking care of you in the hospital. You had flu that was appropriately treated. You also had anemia, which will need to be worked up and may include a colonoscopy. You may resume your home regimen for COPD.    Follow with Primary MD Millsaps, [name], NP in 7 days   Get CBC, CMP, 2 view Chest X ray checked  by Primary MD next visit.    Activity: As tolerated with Full fall precautions use walker/cane & assistance as needed   Disposition Home     Diet: Heart Healthy    For Heart failure patients - Check your Weight same time everyday, if you gain over 2 pounds, or you develop in leg swelling, experience more shortness of breath or chest pain, call your Primary MD immediately. Follow Cardiac Low Salt Diet and 1.5 lit/day fluid restriction.   On your next visit with your primary care physician please Get Medicines reviewed and adjusted.   Please request your Prim.MD to go over all Hospital Tests and Procedure/Radiological results at the follow up, please get all Hospital records sent to your Prim MD by signing hospital release before you go home.   If you experience worsening of your admission symptoms, develop shortness of breath, life threatening emergency, suicidal or homicidal thoughts you must seek medical attention immediately by calling 911 or calling your MD immediately  if symptoms less severe.  You Must read complete instructions/literature along with all the possible adverse reactions/side effects for all the Medicines you take and that have been prescribed to you. Take any new Medicines after you have completely understood and accpet all the possible adverse reactions/side effects.   Do not drive, operating heavy machinery, perform activities at heights, swimming or participation in water activities or provide baby sitting services if your were admitted for syncope or siezures until you have seen by Primary MD or a Neurologist and advised to do  so again.  Do not drive when taking Pain medications.    Do not take more than prescribed Pain, Sleep and Anxiety Medications  Special Instructions: If you have smoked or chewed Tobacco  in the last 2 yrs please stop smoking, stop any regular Alcohol  and or any Recreational drug use.  Wear Seat belts while driving.   Please note  You were cared for by a hospitalist during your hospital stay. If you have any questions about your discharge medications or the care you received while you were in the hospital after you are discharged, you can call the unit and asked to speak with the hospitalist on call if the hospitalist that took care of you is not available. Once you are discharged, your primary care physician will handle any further medical issues. Please note that NO REFILLS for any discharge medications will be authorized once you are discharged, as it is imperative that you return to your primary care physician (or establish a relationship with a primary care physician if you do not have one) for your aftercare needs so that they can reassess your need for medications and monitor your lab values.

## 2015-05-10 NOTE — Progress Notes (Signed)
NURSING PROGRESS NOTE  [name] Morningddie Yochim Jr. [id number] Discharge Data: 05/10/2015 1:16 PM Attending Provider: No att. providers found NWG:[id number]:Millsaps, [name], NP     [name] MorningEddie Mccumbers Jr. to be D/C'd Home per MD order.  Discussed with the patient the After Visit Summary and all questions fully answered. All IV's discontinued with no bleeding noted. All belongings returned to patient for patient to take home.   Last Vital Signs:  Blood pressure 137/77, pulse 97, temperature 98.1 F (36.7 C), temperature source Oral, resp. rate 20, height 5\' 3"  (1.6 m), weight 84.1 kg (185 lb 6.5 oz), SpO2 97 %.  Discharge Medication List   Medication List    STOP taking these medications        aspirin 325 MG tablet     BAYER BACK & BODY PAIN EX ST 500-32.5 MG Tabs  Generic drug:  Aspirin-Caffeine      TAKE these medications        albuterol 108 (90 Base) MCG/ACT inhaler  Commonly known as:  PROVENTIL HFA;VENTOLIN HFA  Inhale 2 puffs into the lungs every 6 (six) hours as needed for wheezing or shortness of breath.     ALPRAZolam 0.5 MG tablet  Commonly known as:  XANAX  Take 0.5 mg by mouth 3 (three) times daily as needed for anxiety.     ANTACID PO  Take by mouth.     cyanocobalamin 1000 MCG tablet  Take 1 tablet (1,000 mcg total) by mouth daily.     dextromethorphan-guaiFENesin 30-600 MG 12hr tablet  Commonly known as:  MUCINEX DM  Take 1 tablet by mouth 2 (two) times daily.     ferrous sulfate 325 (65 FE) MG tablet  Take 1 tablet (325 mg total) by mouth daily with breakfast.  Start taking on:  05/11/2015     furosemide 40 MG tablet  Commonly known as:  LASIX  Take 20 mg by mouth daily.     ipratropium-albuterol 0.5-2.5 (3) MG/3ML Soln  Commonly known as:  DUONEB  Take 3 mLs by nebulization every 6 (six) hours as needed.     mometasone-formoterol 200-5 MCG/ACT Aero  Commonly known as:  DULERA  Inhale 2 puffs into the lungs 2 (two) times daily.     montelukast 10 MG tablet   Commonly known as:  SINGULAIR  Take 1 tablet (10 mg total) by mouth at bedtime.     ofloxacin 0.3 % ophthalmic solution  Commonly known as:  OCUFLOX  Place 1 drop into the right eye 2 (two) times daily.     pantoprazole 40 MG tablet  Commonly known as:  PROTONIX  Take 1 tablet (40 mg total) by mouth 2 (two) times daily.     PROLENSA 0.07 % Soln  Generic drug:  Bromfenac Sodium  Place 1 drop into the right eye daily.     sodium chloride 0.65 % Soln nasal spray  Commonly known as:  OCEAN  Place 1 spray into the nose as needed for congestion (Dry nasal passages).     SPIRIVA HANDIHALER 18 MCG inhalation capsule  Generic drug:  tiotropium  PLACE 1 CAPSULE (18 MCG TOTAL) INTO INHALER AND INHALE DAILY.         Roma KayserStephanie Laila Myhre, RN

## 2015-05-12 DIAGNOSIS — E538 Deficiency of other specified B group vitamins: Secondary | ICD-10-CM | POA: Diagnosis present

## 2015-06-23 ENCOUNTER — Encounter: Payer: Self-pay | Admitting: *Deleted

## 2015-08-19 ENCOUNTER — Emergency Department (HOSPITAL_COMMUNITY): Payer: Medicare Other

## 2015-08-19 ENCOUNTER — Encounter (HOSPITAL_COMMUNITY): Payer: Self-pay | Admitting: Emergency Medicine

## 2015-08-19 ENCOUNTER — Observation Stay (HOSPITAL_COMMUNITY)
Admission: EM | Admit: 2015-08-19 | Discharge: 2015-08-23 | Disposition: A | Discharge status: Home / Self Care | Payer: Medicare Other | Attending: Internal Medicine | Admitting: Internal Medicine

## 2015-08-19 DIAGNOSIS — Z9981 Dependence on supplemental oxygen: Secondary | ICD-10-CM | POA: Insufficient documentation

## 2015-08-19 DIAGNOSIS — K219 Gastro-esophageal reflux disease without esophagitis: Secondary | ICD-10-CM | POA: Insufficient documentation

## 2015-08-19 DIAGNOSIS — I110 Hypertensive heart disease with heart failure: Secondary | ICD-10-CM | POA: Diagnosis not present

## 2015-08-19 DIAGNOSIS — J9610 Chronic respiratory failure, unspecified whether with hypoxia or hypercapnia: Secondary | ICD-10-CM | POA: Diagnosis not present

## 2015-08-19 DIAGNOSIS — M4854XA Collapsed vertebra, not elsewhere classified, thoracic region, initial encounter for fracture: Secondary | ICD-10-CM | POA: Insufficient documentation

## 2015-08-19 DIAGNOSIS — Z87891 Personal history of nicotine dependence: Secondary | ICD-10-CM | POA: Diagnosis not present

## 2015-08-19 DIAGNOSIS — K420 Umbilical hernia with obstruction, without gangrene: Secondary | ICD-10-CM | POA: Diagnosis not present

## 2015-08-19 DIAGNOSIS — J449 Chronic obstructive pulmonary disease, unspecified: Secondary | ICD-10-CM | POA: Diagnosis present

## 2015-08-19 DIAGNOSIS — R0789 Other chest pain: Secondary | ICD-10-CM | POA: Insufficient documentation

## 2015-08-19 DIAGNOSIS — R109 Unspecified abdominal pain: Secondary | ICD-10-CM | POA: Diagnosis present

## 2015-08-19 DIAGNOSIS — Z79899 Other long term (current) drug therapy: Secondary | ICD-10-CM | POA: Insufficient documentation

## 2015-08-19 DIAGNOSIS — R7989 Other specified abnormal findings of blood chemistry: Secondary | ICD-10-CM

## 2015-08-19 DIAGNOSIS — I5033 Acute on chronic diastolic (congestive) heart failure: Secondary | ICD-10-CM | POA: Insufficient documentation

## 2015-08-19 DIAGNOSIS — I5032 Chronic diastolic (congestive) heart failure: Secondary | ICD-10-CM

## 2015-08-19 DIAGNOSIS — R748 Abnormal levels of other serum enzymes: Secondary | ICD-10-CM | POA: Insufficient documentation

## 2015-08-19 DIAGNOSIS — R079 Chest pain, unspecified: Secondary | ICD-10-CM | POA: Diagnosis not present

## 2015-08-19 DIAGNOSIS — R778 Other specified abnormalities of plasma proteins: Secondary | ICD-10-CM | POA: Diagnosis present

## 2015-08-19 DIAGNOSIS — Z7951 Long term (current) use of inhaled steroids: Secondary | ICD-10-CM | POA: Insufficient documentation

## 2015-08-19 DIAGNOSIS — I10 Essential (primary) hypertension: Secondary | ICD-10-CM | POA: Diagnosis present

## 2015-08-19 HISTORY — DX: Chronic diastolic (congestive) heart failure: I50.32

## 2015-08-19 LAB — GLUCOSE, CAPILLARY: Glucose-Capillary: 93 mg/dL (ref 65–99)

## 2015-08-19 LAB — LIPASE, BLOOD: Lipase: 24 U/L (ref 11–51)

## 2015-08-19 LAB — COMPREHENSIVE METABOLIC PANEL
ALK PHOS: 63 U/L (ref 38–126)
ALT: 13 U/L — AB (ref 17–63)
ANION GAP: 8 (ref 5–15)
AST: 21 U/L (ref 15–41)
Albumin: 3.4 g/dL — ABNORMAL LOW (ref 3.5–5.0)
BILIRUBIN TOTAL: 0.2 mg/dL — AB (ref 0.3–1.2)
BUN: 12 mg/dL (ref 6–20)
CALCIUM: 9.3 mg/dL (ref 8.9–10.3)
CO2: 33 mmol/L — AB (ref 22–32)
CREATININE: 0.99 mg/dL (ref 0.61–1.24)
Chloride: 100 mmol/L — ABNORMAL LOW (ref 101–111)
Glucose, Bld: 105 mg/dL — ABNORMAL HIGH (ref 65–99)
Potassium: 3.7 mmol/L (ref 3.5–5.1)
SODIUM: 141 mmol/L (ref 135–145)
TOTAL PROTEIN: 6.1 g/dL — AB (ref 6.5–8.1)

## 2015-08-19 LAB — URINALYSIS, ROUTINE W REFLEX MICROSCOPIC
Bilirubin Urine: NEGATIVE
GLUCOSE, UA: NEGATIVE mg/dL
Hgb urine dipstick: NEGATIVE
KETONES UR: NEGATIVE mg/dL
LEUKOCYTES UA: NEGATIVE
NITRITE: NEGATIVE
PH: 8 (ref 5.0–8.0)
Protein, ur: NEGATIVE mg/dL
SPECIFIC GRAVITY, URINE: 1.025 (ref 1.005–1.030)

## 2015-08-19 LAB — CBC
HCT: 37.5 % — ABNORMAL LOW (ref 39.0–52.0)
Hemoglobin: 10 g/dL — ABNORMAL LOW (ref 13.0–17.0)
MCH: 24.8 pg — AB (ref 26.0–34.0)
MCHC: 26.7 g/dL — ABNORMAL LOW (ref 30.0–36.0)
MCV: 93.1 fL (ref 78.0–100.0)
PLATELETS: 329 10*3/uL (ref 150–400)
RBC: 4.03 MIL/uL — AB (ref 4.22–5.81)
RDW: 16.3 % — ABNORMAL HIGH (ref 11.5–15.5)
WBC: 9.7 10*3/uL (ref 4.0–10.5)

## 2015-08-19 LAB — TROPONIN I
TROPONIN I: 0.04 ng/mL — AB (ref <0.03)
Troponin I: 0.04 ng/mL (ref <0.03)
Troponin I: 0.04 ng/mL (ref <0.03)

## 2015-08-19 LAB — TYPE AND SCREEN
ABO/RH(D): O POS
ANTIBODY SCREEN: NEGATIVE

## 2015-08-19 LAB — PROTIME-INR
INR: 0.95 (ref 0.00–1.49)
PROTHROMBIN TIME: 12.9 s (ref 11.6–15.2)

## 2015-08-19 LAB — APTT: APTT: 28 s (ref 24–37)

## 2015-08-19 LAB — BRAIN NATRIURETIC PEPTIDE: B NATRIURETIC PEPTIDE 5: 98.3 pg/mL (ref 0.0–100.0)

## 2015-08-19 MED ORDER — ONDANSETRON HCL 4 MG/2ML IJ SOLN: 4.0000 mg | Freq: Three times a day (TID) | INTRAMUSCULAR | Status: DC | PRN

## 2015-08-19 MED ORDER — ASPIRIN EC 325 MG PO TBEC
325.0000 mg | DELAYED_RELEASE_TABLET | Freq: Once | ORAL | Status: AC
Start: 2015-08-19 — End: 2015-08-19
  Administered 2015-08-19: 325 mg via ORAL
  Filled 2015-08-19: qty 1

## 2015-08-19 MED ORDER — FERROUS SULFATE 325 (65 FE) MG PO TABS
325.0000 mg | ORAL_TABLET | Freq: Every day | ORAL | Status: DC
  Administered 2015-08-20 – 2015-08-23 (×4): 325 mg via ORAL
  Filled 2015-08-19 (×4): qty 1

## 2015-08-19 MED ORDER — FUROSEMIDE 20 MG PO TABS
20.0000 mg | ORAL_TABLET | Freq: Every day | ORAL | Status: DC
  Administered 2015-08-20: 20 mg via ORAL
  Filled 2015-08-19: qty 1

## 2015-08-19 MED ORDER — FENTANYL CITRATE (PF) 100 MCG/2ML IJ SOLN
50.0000 ug | Freq: Once | INTRAMUSCULAR | Status: AC
  Administered 2015-08-19: 50 ug via INTRAVENOUS
  Filled 2015-08-19: qty 2

## 2015-08-19 MED ORDER — VITAMIN B-12 1000 MCG PO TABS: 1000.0000 ug | ORAL_TABLET | Freq: Every day | ORAL | Status: DC

## 2015-08-19 MED ORDER — OFLOXACIN 0.3 % OP SOLN
1.0000 [drp] | Freq: Two times a day (BID) | OPHTHALMIC | Status: DC
  Filled 2015-08-19: qty 5

## 2015-08-19 MED ORDER — DM-GUAIFENESIN ER 30-600 MG PO TB12
1.0000 | ORAL_TABLET | Freq: Two times a day (BID) | ORAL | Status: DC
  Administered 2015-08-19 – 2015-08-23 (×8): 1 via ORAL
  Filled 2015-08-19 (×8): qty 1

## 2015-08-19 MED ORDER — ALPRAZOLAM 0.5 MG PO TABS
0.5000 mg | ORAL_TABLET | Freq: Three times a day (TID) | ORAL | Status: DC | PRN
  Administered 2015-08-20 – 2015-08-22 (×6): 0.5 mg via ORAL
  Filled 2015-08-19 (×7): qty 1

## 2015-08-19 MED ORDER — IOPAMIDOL (ISOVUE-300) INJECTION 61%
INTRAVENOUS | Status: AC
  Administered 2015-08-19: 100 mL
  Filled 2015-08-19: qty 100

## 2015-08-19 MED ORDER — OXYCODONE-ACETAMINOPHEN 5-325 MG PO TABS
1.0000 | ORAL_TABLET | ORAL | Status: DC | PRN
  Administered 2015-08-20 (×2): 1 via ORAL
  Filled 2015-08-19 (×2): qty 1

## 2015-08-19 MED ORDER — HYDROCHLOROTHIAZIDE 25 MG PO TABS
25.0000 mg | ORAL_TABLET | Freq: Every day | ORAL | Status: DC
  Administered 2015-08-20 – 2015-08-23 (×4): 25 mg via ORAL
  Filled 2015-08-19 (×4): qty 1

## 2015-08-19 MED ORDER — IPRATROPIUM-ALBUTEROL 0.5-2.5 (3) MG/3ML IN SOLN
3.0000 mL | Freq: Four times a day (QID) | RESPIRATORY_TRACT | Status: DC
  Administered 2015-08-19 – 2015-08-21 (×8): 3 mL via RESPIRATORY_TRACT
  Filled 2015-08-19 (×8): qty 3

## 2015-08-19 MED ORDER — TIOTROPIUM BROMIDE MONOHYDRATE 18 MCG IN CAPS
18.0000 ug | ORAL_CAPSULE | Freq: Every day | RESPIRATORY_TRACT | Status: DC
  Administered 2015-08-21 – 2015-08-23 (×3): 18 ug via RESPIRATORY_TRACT
  Filled 2015-08-19: qty 5

## 2015-08-19 MED ORDER — MORPHINE SULFATE (PF) 2 MG/ML IV SOLN
2.0000 mg | INTRAVENOUS | Status: DC | PRN
  Administered 2015-08-19 – 2015-08-23 (×16): 2 mg via INTRAVENOUS
  Filled 2015-08-19 (×18): qty 1

## 2015-08-19 MED ORDER — ACETAMINOPHEN 325 MG PO TABS: 650.0000 mg | ORAL_TABLET | ORAL | Status: DC | PRN

## 2015-08-19 MED ORDER — ADULT MULTIVITAMIN W/MINERALS CH
1.0000 | ORAL_TABLET | Freq: Every day | ORAL | Status: DC
  Administered 2015-08-20 – 2015-08-23 (×4): 1 via ORAL
  Filled 2015-08-19 (×4): qty 1

## 2015-08-19 MED ORDER — SALINE SPRAY 0.65 % NA SOLN
1.0000 | NASAL | Status: DC | PRN
  Filled 2015-08-19: qty 44

## 2015-08-19 MED ORDER — PANTOPRAZOLE SODIUM 40 MG PO TBEC
40.0000 mg | DELAYED_RELEASE_TABLET | Freq: Two times a day (BID) | ORAL | Status: DC
  Administered 2015-08-19 – 2015-08-23 (×8): 40 mg via ORAL
  Filled 2015-08-19 (×8): qty 1

## 2015-08-19 MED ORDER — KETOROLAC TROMETHAMINE 0.5 % OP SOLN
1.0000 [drp] | Freq: Four times a day (QID) | OPHTHALMIC | Status: DC
  Filled 2015-08-19: qty 3

## 2015-08-19 MED ORDER — ALUM & MAG HYDROXIDE-SIMETH 200-200-20 MG/5ML PO SUSP
30.0000 mL | Freq: Four times a day (QID) | ORAL | Status: DC | PRN
  Administered 2015-08-20: 30 mL via ORAL
  Filled 2015-08-19: qty 30

## 2015-08-19 MED ORDER — NITROGLYCERIN 0.4 MG SL SUBL: 0.4000 mg | SUBLINGUAL_TABLET | SUBLINGUAL | Status: DC | PRN

## 2015-08-19 MED ORDER — ONDANSETRON HCL 4 MG/2ML IJ SOLN: 4.0000 mg | Freq: Four times a day (QID) | INTRAMUSCULAR | Status: DC | PRN

## 2015-08-19 MED ORDER — HEPARIN SODIUM (PORCINE) 5000 UNIT/ML IJ SOLN
5000.0000 [IU] | Freq: Three times a day (TID) | INTRAMUSCULAR | Status: DC
  Administered 2015-08-19 – 2015-08-23 (×11): 5000 [IU] via SUBCUTANEOUS
  Filled 2015-08-19 (×11): qty 1

## 2015-08-19 MED ORDER — ATORVASTATIN CALCIUM 40 MG PO TABS
40.0000 mg | ORAL_TABLET | Freq: Every day | ORAL | Status: DC
  Administered 2015-08-19 – 2015-08-22 (×4): 40 mg via ORAL
  Filled 2015-08-19 (×4): qty 1

## 2015-08-19 MED ORDER — MOMETASONE FURO-FORMOTEROL FUM 200-5 MCG/ACT IN AERO
2.0000 | INHALATION_SPRAY | Freq: Two times a day (BID) | RESPIRATORY_TRACT | Status: DC
  Administered 2015-08-19 – 2015-08-23 (×8): 2 via RESPIRATORY_TRACT
  Filled 2015-08-19: qty 8.8

## 2015-08-19 MED ORDER — ASPIRIN EC 325 MG PO TBEC
325.0000 mg | DELAYED_RELEASE_TABLET | Freq: Every day | ORAL | Status: DC
  Administered 2015-08-20: 325 mg via ORAL
  Filled 2015-08-19: qty 1

## 2015-08-19 MED ORDER — HYDRALAZINE HCL 20 MG/ML IJ SOLN: 5.0000 mg | INTRAMUSCULAR | Status: DC | PRN

## 2015-08-19 MED ORDER — ALBUTEROL SULFATE (2.5 MG/3ML) 0.083% IN NEBU: 2.5000 mg | INHALATION_SOLUTION | RESPIRATORY_TRACT | Status: DC | PRN

## 2015-08-19 MED ORDER — MONTELUKAST SODIUM 10 MG PO TABS
10.0000 mg | ORAL_TABLET | Freq: Every day | ORAL | Status: DC
  Administered 2015-08-19 – 2015-08-22 (×4): 10 mg via ORAL
  Filled 2015-08-19 (×4): qty 1

## 2015-08-19 NOTE — ED Notes (Addendum)
Pt sts he feels "coming here was a waste of time".  This RN asked patient to explain his frustrations.  Pt sts his pain has not been addressed.  Pt sts he has not been given any answers.  While this RN was speaking to the pt, Dr. Fritzi Mandeseister came in to speak to patient.  Made aware of elevated Troponin and need for follow up/admission.  MD sts he will look at possible pain treatments for pt.  Pt verbalized understanding.

## 2015-08-19 NOTE — ED Notes (Signed)
Attempted report x1. 

## 2015-08-19 NOTE — ED Provider Notes (Signed)
CSN: [id number]     Arrival date & time 08/19/15  1341 History   First MD Initiated Contact with Patient 08/19/15 1619     Chief Complaint  Patient presents with  . Abdominal Pain  . Back Pain     (Consider location/radiation/quality/duration/timing/severity/associated sxs/prior Treatment) Patient is a 75 y.o. male presenting with abdominal pain and back pain. The history is provided by the patient.  Abdominal Pain Pain location:  Generalized Pain quality: aching and bloating   Pain radiates to:  Does not radiate Pain severity:  Moderate Onset quality:  Gradual Duration:  2 weeks Timing:  Constant Progression:  Worsening Chronicity:  New Associated symptoms: no chest pain, no chills, no cough, no diarrhea, no fever, no nausea, no shortness of breath, no sore throat and no vomiting   Back Pain Associated symptoms: abdominal pain   Associated symptoms: no chest pain and no fever     Past Medical History  Diagnosis Date  . COPD (chronic obstructive pulmonary disease) (HCC)   . Chronic bronchitis     "get it q yr" (07/17/2013)  . Hypertension   . DVT (deep venous thrombosis) (HCC) 1990's    "?LLE"  . On home oxygen therapy     "2L 24/7" (08/19/2015)  . GERD (gastroesophageal reflux disease)     "related to RX I take" (07/17/2013)  . Daily headache   . Migraine     "all my life; no really bad migraines in 8-9 since work stress is gone" (07/17/2013)  . Pneumonia     "several times"  . Acute respiratory failure with hypoxia (HCC) 01/2014  . Chronic diastolic CHF (congestive heart failure) [facility])    Past Surgical History  Procedure Laterality Date  . Tonsillectomy  1949  . Inguinal hernia repair Left ~ 1990  . Cataract extraction w/ intraocular lens implant Right 05/05/2015   Family History  Problem Relation Age of Onset  . Breast cancer Mother   . Alzheimer's disease Father    Social History  Substance Use Topics  . Smoking status: Former Smoker -- 1.00 packs/day for  10 years    Types: Cigarettes    Quit date: 02/06/1998  . Smokeless tobacco: Never Used  . Alcohol Use: No    Review of Systems  Constitutional: Negative for fever and chills.  HENT: Negative for congestion and sore throat.   Eyes: Negative for pain.  Respiratory: Negative for cough and shortness of breath.   Cardiovascular: Negative for chest pain and palpitations.  Gastrointestinal: Positive for abdominal pain. Negative for nausea, vomiting, diarrhea and blood in stool.  Endocrine: Negative.   Genitourinary: Negative for flank pain.  Musculoskeletal: Positive for back pain. Negative for neck pain.  Skin: Negative for rash.  Allergic/Immunologic: Negative.   Neurological: Negative for dizziness, syncope and light-headedness.  Psychiatric/Behavioral: Negative for confusion.      Allergies  Advair diskus; Rosalyn Gess; Codeine; Ferrous sulfate; Other; and Prednisone  Home Medications   Prior to Admission medications   Medication Sig Start Date End Date Taking? Authorizing Provider  albuterol (PROVENTIL HFA;VENTOLIN HFA) 108 (90 BASE) MCG/ACT inhaler Inhale 2 puffs into the lungs every 6 (six) hours as needed for wheezing or shortness of breath. 04/13/14  Yes Storm Frisk, MD  ALPRAZolam Prudy Feeler) 0.5 MG tablet Take 0.5 mg by mouth 4 (four) times daily as needed for anxiety.  04/06/14  Yes Historical Provider, MD  Calcium Carbonate Antacid (ANTACID PO) Take 1 tablet by mouth every 4 (four) hours as needed (  for acid reflux or heartburn).    Yes Historical Provider, MD  dextromethorphan-guaiFENesin (MUCINEX DM) 30-600 MG 12hr tablet Take 1 tablet by mouth 2 (two) times daily. 05/10/15  Yes Ruben Im, MD  furosemide (LASIX) 40 MG tablet Take 20 mg by mouth daily.   Yes Historical Provider, MD  hydrochlorothiazide (HYDRODIURIL) 25 MG tablet Take 25 mg by mouth daily. 07/06/15  Yes Historical Provider, MD  ipratropium (ATROVENT) 0.02 % nebulizer solution Inhale 0.5 mg into the lungs every 8  (eight) hours as needed for wheezing or shortness of breath.  07/28/15  Yes Historical Provider, MD  ipratropium-albuterol (DUONEB) 0.5-2.5 (3) MG/3ML SOLN Take 3 mLs by nebulization every 6 (six) hours as needed. Patient taking differently: Take 3 mLs by nebulization every 6 (six) hours as needed (for wheezing or shortness of breath).  05/12/14  Yes Albertine Grates, MD  IRON CR PO Take 100 mg by mouth daily.   Yes Historical Provider, MD  mometasone-formoterol (DULERA) 200-5 MCG/ACT AERO Inhale 2 puffs into the lungs 2 (two) times daily.   Yes Historical Provider, MD  Multiple Vitamin (MULTIVITAMIN) tablet Take 1 tablet by mouth daily.   Yes Historical Provider, MD  ondansetron (ZOFRAN) 8 MG tablet Take 8 mg by mouth every 8 (eight) hours as needed for nausea or vomiting.  08/19/15  Yes Historical Provider, MD  pantoprazole (PROTONIX) 40 MG tablet Take 1 tablet (40 mg total) by mouth 2 (two) times daily. 11/01/13  Yes Vassie Loll, MD  sodium chloride (OCEAN) 0.65 % SOLN nasal spray Place 1 spray into the nose as needed for congestion (Dry nasal passages).   Yes Historical Provider, MD   BP 152/85 mmHg  Pulse 96  Temp(Src) 98.2 F (36.8 C) (Oral)  Resp 20  Ht 5\' 7"  (1.702 m)  Wt 82.283 kg  BMI 28.40 kg/m2  SpO2 96% Physical Exam  Constitutional: He is oriented to person, place, and time. He appears well-developed and well-nourished.  HENT:  Head: Normocephalic and atraumatic.  Eyes: Conjunctivae and EOM are normal. Pupils are equal, round, and reactive to light.  Neck: Normal range of motion. Neck supple.  Cardiovascular: Normal rate, regular rhythm, normal heart sounds and intact distal pulses.   Pulmonary/Chest: Effort normal. No respiratory distress. He has wheezes (diffuse).  Abdominal: Soft. Bowel sounds are normal. There is tenderness. There is no rebound and no CVA tenderness.  Musculoskeletal: Normal range of motion.  Neurological: He is alert and oriented to person, place, and time. He has  normal reflexes. No cranial nerve deficit.  Skin: Skin is warm and dry.    ED Course  Procedures (including critical care time) Labs Review Labs Reviewed  COMPREHENSIVE METABOLIC PANEL - Abnormal; Notable for the following:    Chloride 100 (*)    CO2 33 (*)    Glucose, Bld 105 (*)    Total Protein 6.1 (*)    Albumin 3.4 (*)    ALT 13 (*)    Total Bilirubin 0.2 (*)    All other components within normal limits  CBC - Abnormal; Notable for the following:    RBC 4.03 (*)    Hemoglobin 10.0 (*)    HCT 37.5 (*)    MCH 24.8 (*)    MCHC 26.7 (*)    RDW 16.3 (*)    All other components within normal limits  TROPONIN I - Abnormal; Notable for the following:    Troponin I 0.04 (*)    All other components within normal limits  TROPONIN I - Abnormal; Notable for the following:    Troponin I 0.04 (*)    All other components within normal limits  TROPONIN I - Abnormal; Notable for the following:    Troponin I 0.04 (*)    All other components within normal limits  LIPASE, BLOOD  URINALYSIS, ROUTINE W REFLEX MICROSCOPIC (NOT AT [facility])  BRAIN NATRIURETIC PEPTIDE  PROTIME-INR  APTT  GLUCOSE, CAPILLARY  HEMOGLOBIN A1C  LIPID PANEL  TROPONIN I  TROPONIN I  TYPE AND SCREEN    Imaging Review Ct Abdomen Pelvis W Contrast  08/19/2015  CLINICAL DATA:  Abdominal and back pain for 2 weeks. Bloating of the abdomen and distention. EXAM: CT ABDOMEN AND PELVIS WITH CONTRAST TECHNIQUE: Multidetector CT imaging of the abdomen and pelvis was performed using the standard protocol following bolus administration of intravenous contrast. CONTRAST:  100mL ISOVUE-300 IOPAMIDOL (ISOVUE-300) INJECTION 61% COMPARISON:  Abdominal radiograph 05/06/2015 FINDINGS: Lower chest: Markedly severe emphysema at the lung bases. Bandlike nodularity in the right lower lobe and right middle lobe, some of the prior scattered nodularity in the lower lobes has resolved in the antrum. Right coronary artery atherosclerotic  calcification. Hepatobiliary: Unremarkable Pancreas: Unremarkable Spleen: Unremarkable Adrenals/Urinary Tract: Adrenal glands normal. 9 mm cyst of the right mid to lower kidney, image 38/201. 1.7 by 1.6 cm simple appearing cyst of the left mid kidney, image 35/201. No stones or hydronephrosis. Urinary bladder unremarkable. Stomach/Bowel: Sigmoid colon diverticulosis, no active diverticulitis. Umbilical hernia contains fluid in a small margin of small bowel on image 87/204 Hal Hope(Richter type hernia). No small bowel dilatation. Vascular/Lymphatic: Aortoiliac atherosclerotic vascular disease. No pathologic adenopathy. Reproductive: Mildly prominent prostate gland, 5 point 4 by 4.2 cm Other: No supplemental non-categorized findings. Musculoskeletal: Bridging spurring of the right sacroiliac joint. Superior endplate compression fracture at T9, new compared to 05/10/2014 and likely new compared to 05/06/15. Remote inferior endplate compression at L3. IMPRESSION: 1. Richter periumbilical hernia contains a small margin of the small bowel and some adjacent fluid. Inflammation or injury of the small bowel wall is not excluded but there is no bowel dilatation. 2. New superior endplate compression fracture of T9 3. Markedly severe emphysema at the lung bases. There is some bandlike nodularity at the right lung base but other areas of nodularity of both lung bases have resolved compared to the prior CT chest from 2016. 4. Sigmoid colon diverticulosis. 5. Aortoiliac atherosclerotic vascular disease. Right coronary artery atherosclerotic calcification. Electronically Signed   By: Gaylyn RongWalter  Liebkemann M.D.   On: 08/19/2015 18:07   I have personally reviewed and evaluated these images and lab results as part of my medical decision-making.   EKG Interpretation   Date/Time:  Thursday August 19 2015 16:53:56 EDT Ventricular Rate:  88 PR Interval:    QRS Duration: 111 QT Interval:  364 QTC Calculation: 441 R Axis:   77 Text  Interpretation:  Age not entered, assumed to be  75 years old for  purpose of ECG interpretation Sinus rhythm Short PR interval Low voltage,  precordial leads RSR' in V1 or V2, probably normal variant No significant  change since last tracing Confirmed by [facility]  MD, Alphonzo LemmingsWHITNEY ([id number]) on  08/19/2015 5:10:19 PM      MDM   Final diagnoses:  Troponin level elevated  Chronic diastolic CHF (congestive heart failure) [facility](HCC)    The patient is a 75 year old male presented today for diffuse abdominal discomfort and back pain. Denies any recent traumas nausea vomiting diarrhea or lack of bowel or movements.  On exam pt is HDS in NAD.  Abdomen with diffuse tenderness.  CBC, CMP, lipase unremarkable. CT abdomen pelvis with superior endplate fx but no acute unstable spine fx.  Small henia but no signs of obstructive process and doubt ischemia.    Pt reported intermittent chest pressure and trop ordered being mildly positive. BNP WNL and low suspicion for CHF as etiology of elevated trop.  Low suspicion for PE.  Given ASA.  Given SL nitro for possible anginal equivalent of abdominal pain.   Discussed with hospitalist who agrees to admission for trending trops and likely stress testing.   Labs and ECG were viewed by myself and incorporated into medical decision making.  Discussed pertinent finding with patient or caregiver prior to admission with no further questions.  Pt care supervised by my attending Dr. Anitra Lauth.   Tery Sanfilippo, MD PGY-3 Emergency Medicine     Tery Sanfilippo, MD 08/20/15 0000  Gwyneth Sprout, MD 08/20/15 2238

## 2015-08-19 NOTE — ED Notes (Signed)
Attempted report x 2 

## 2015-08-19 NOTE — ED Notes (Signed)
MD at bedside. 

## 2015-08-19 NOTE — Consult Note (Signed)
Reason for Consult: abdominal pain, umbilical hernia Referring Physician: Dr. [name]  [name] [name]. is an 75 y.o. male.  HPI: I been asked to see this patient by Dr. Blaine Hamper for abdominal pain. The patient reports a two-week history of cramping abdominal pain. He is also having moderate to severe back pain. He reports the pain is across his upper abdomen. Bowel movements a been normal. He reports some is no nausea but did have dry heaves twice. He has a CAT scan performed which shows a possible Richter hernia at the umbilicus. He has severe COPD and is on 2 L of oxygen at home. He is otherwise without complaints.  Past Medical History  Diagnosis Date  . COPD (chronic obstructive pulmonary disease) ([place])   . Chronic bronchitis     "get it q yr" (07/17/2013)  . Hypertension   . DVT (deep venous thrombosis) ([place]) 1990's    "?LLE"  . On home oxygen therapy     "2L 24/7" (05/06/2015)  . GERD (gastroesophageal reflux disease)     "related to RX I take" (07/17/2013)  . Daily headache   . Migraine     "all my life; no really bad migraines in 8-9 since work stress is gone" (07/17/2013)  . Pneumonia     "several times"  . Acute respiratory failure with hypoxia ([place]) 01/2014  . Chronic diastolic CHF (congestive heart failure) [facility])     Past Surgical History  Procedure Laterality Date  . Tonsillectomy  1949  . Inguinal hernia repair Left ~ 1990  . Cataract extraction w/ intraocular lens implant Right 05/05/2015    Family History  Problem Relation Age of Onset  . Breast cancer Mother   . Alzheimer's disease Father     Social History:  reports that he quit smoking about 17 years ago. His smoking use included Cigarettes. He has a 10 pack-year smoking history. He has never used smokeless tobacco. He reports that he does not drink alcohol or use illicit drugs.  Allergies:  Allergies  Allergen Reactions  . Advair Diskus [Fluticasone-Salmeterol] Other (See Comments)    Blurred vision  .  Brovana [Arformoterol]     Ineffective--exacerbated coughing/congestion  . Codeine Other (See Comments)    Hallucinations, blurred vision  . Ferrous Sulfate Other (See Comments)    Stomach pain  . Other Swelling    Bicillin injection in 1961--swelling in lower extremities and joints, and whelps up arm.  Has taken penicillin since w/o reaction.    Medications: I have reviewed the patient's current medications.  Results for orders placed or performed during the hospital encounter of 08/19/15 (from the past 48 hour(s))  Lipase, blood     Status: None   Collection Time: 08/19/15  1:55 PM  Result Value Ref Range   Lipase 24 11 - 51 U/L  Comprehensive metabolic panel     Status: Abnormal   Collection Time: 08/19/15  1:55 PM  Result Value Ref Range   Sodium 141 135 - 145 mmol/L   Potassium 3.7 3.5 - 5.1 mmol/L   Chloride 100 (L) 101 - 111 mmol/L   CO2 33 (H) 22 - 32 mmol/L   Glucose, Bld 105 (H) 65 - 99 mg/dL   BUN 12 6 - 20 mg/dL   Creatinine, Ser 0.99 0.61 - 1.24 mg/dL   Calcium 9.3 8.9 - 10.3 mg/dL   Total Protein 6.1 (L) 6.5 - 8.1 g/dL   Albumin 3.4 (L) 3.5 - 5.0 g/dL   AST 21  15 - 41 U/L   ALT 13 (L) 17 - 63 U/L   Alkaline Phosphatase 63 38 - 126 U/L   Total Bilirubin 0.2 (L) 0.3 - 1.2 mg/dL   GFR calc non Af Amer >60 >60 mL/min   GFR calc Af Amer >60 >60 mL/min    Comment: (NOTE) The eGFR has been calculated using the CKD EPI equation. This calculation has not been validated in all clinical situations. eGFR's persistently <60 mL/min signify possible Chronic Kidney Disease.    Anion gap 8 5 - 15  CBC     Status: Abnormal   Collection Time: 08/19/15  1:55 PM  Result Value Ref Range   WBC 9.7 4.0 - 10.5 K/uL   RBC 4.03 (L) 4.22 - 5.81 MIL/uL   Hemoglobin 10.0 (L) 13.0 - 17.0 g/dL   HCT 37.5 (L) 39.0 - 52.0 %   MCV 93.1 78.0 - 100.0 fL   MCH 24.8 (L) 26.0 - 34.0 pg   MCHC 26.7 (L) 30.0 - 36.0 g/dL   RDW 16.3 (H) 11.5 - 15.5 %   Platelets 329 150 - 400 K/uL  Troponin  I     Status: Abnormal   Collection Time: 08/19/15  5:08 PM  Result Value Ref Range   Troponin I 0.04 (HH) <0.03 ng/mL    Comment: CRITICAL RESULT CALLED TO, READ BACK BY AND VERIFIED WITH: [facility]. [facility] RN [phone] GREEN R   Brain natriuretic peptide     Status: None   Collection Time: 08/19/15  5:08 PM  Result Value Ref Range   B Natriuretic Peptide 98.3 0.0 - 100.0 pg/mL  Urinalysis, Routine w reflex microscopic     Status: None   Collection Time: 08/19/15  5:16 PM  Result Value Ref Range   Color, Urine YELLOW YELLOW   APPearance CLEAR CLEAR   Specific Gravity, Urine 1.025 1.005 - 1.030   pH 8.0 5.0 - 8.0   Glucose, UA NEGATIVE NEGATIVE mg/dL   Hgb urine dipstick NEGATIVE NEGATIVE   Bilirubin Urine NEGATIVE NEGATIVE   Ketones, ur NEGATIVE NEGATIVE mg/dL   Protein, ur NEGATIVE NEGATIVE mg/dL   Nitrite NEGATIVE NEGATIVE   Leukocytes, UA NEGATIVE NEGATIVE    Comment: MICROSCOPIC NOT DONE ON URINES WITH NEGATIVE PROTEIN, BLOOD, LEUKOCYTES, NITRITE, OR GLUCOSE <1000 mg/dL.  Troponin I     Status: Abnormal   Collection Time: 08/19/15  7:48 PM  Result Value Ref Range   Troponin I 0.04 (HH) <0.03 ng/mL    Comment: CRITICAL VALUE NOTED.  VALUE IS CONSISTENT WITH PREVIOUSLY REPORTED AND CALLED VALUE.    Ct Abdomen Pelvis W Contrast  08/19/2015  CLINICAL DATA:  Abdominal and back pain for 2 weeks. Bloating of the abdomen and distention. EXAM: CT ABDOMEN AND PELVIS WITH CONTRAST TECHNIQUE: Multidetector CT imaging of the abdomen and pelvis was performed using the standard protocol following bolus administration of intravenous contrast. CONTRAST:  119m ISOVUE-300 IOPAMIDOL (ISOVUE-300) INJECTION 61% COMPARISON:  Abdominal radiograph 05/06/2015 FINDINGS: Lower chest: Markedly severe emphysema at the lung bases. Bandlike nodularity in the right lower lobe and right middle lobe, some of the prior scattered nodularity in the lower lobes has resolved in the antrum. Right coronary artery  atherosclerotic calcification. Hepatobiliary: Unremarkable Pancreas: Unremarkable Spleen: Unremarkable Adrenals/Urinary Tract: Adrenal glands normal. 9 mm cyst of the right mid to lower kidney, image 38/201. 1.7 by 1.6 cm simple appearing cyst of the left mid kidney, image 35/201. No stones or hydronephrosis. Urinary bladder unremarkable. Stomach/Bowel: Sigmoid colon diverticulosis,  no active diverticulitis. Umbilical hernia contains fluid in a small margin of small bowel on image 87/204 Darron Doom type hernia). No small bowel dilatation. Vascular/Lymphatic: Aortoiliac atherosclerotic vascular disease. No pathologic adenopathy. Reproductive: Mildly prominent prostate gland, 5 point 4 by 4.2 cm Other: No supplemental non-categorized findings. Musculoskeletal: Bridging spurring of the right sacroiliac joint. Superior endplate compression fracture at T9, new compared to 05/10/2014 and likely new compared to 05/06/15. Remote inferior endplate compression at L3. IMPRESSION: 1. Richter periumbilical hernia contains a small margin of the small bowel and some adjacent fluid. Inflammation or injury of the small bowel wall is not excluded but there is no bowel dilatation. 2. New superior endplate compression fracture of T9 3. Markedly severe emphysema at the lung bases. There is some bandlike nodularity at the right lung base but other areas of nodularity of both lung bases have resolved compared to the prior CT chest from 2016. 4. Sigmoid colon diverticulosis. 5. Aortoiliac atherosclerotic vascular disease. Right coronary artery atherosclerotic calcification. Electronically Signed   By: Van Clines M.D.   On: 08/19/2015 18:07    Review of Systems  All other systems reviewed and are negative.  Blood pressure 167/95, pulse 97, temperature 98.2 F (36.8 C), temperature source Oral, resp. rate 16, SpO2 97 %. Physical Exam  Constitutional: He is oriented to person, place, and time. He appears well-developed and  well-nourished. No distress.  HENT:  Head: Normocephalic and atraumatic.  Right Ear: External ear normal.  Left Ear: External ear normal.  Nose: Nose normal.  Mouth/Throat: Oropharynx is clear and moist.  Eyes: Conjunctivae are normal. Pupils are equal, round, and reactive to light. Right eye exhibits no discharge. Left eye exhibits no discharge. No scleral icterus.  Neck: Normal range of motion. No tracheal deviation present.  Cardiovascular: Normal rate, regular rhythm, normal heart sounds and intact distal pulses.   No murmur heard. Respiratory: Effort normal and breath sounds normal. No respiratory distress.  GI: Soft. Bowel sounds are normal.  He has an obese abdomen. I can feel the small fascial defect at the umbilicus but there is currently nothing incarcerated into the defect. He is nontender at the umbilicus. He does have mild tenderness across his upper abdomen. There is no frank peritonitis  Musculoskeletal: Normal range of motion. He exhibits no edema or tenderness.  Lymphadenopathy:    He has no cervical adenopathy.  Neurological: He is alert and oriented to person, place, and time.  Skin: Skin is warm and dry. He is not diaphoretic. No erythema.  Psychiatric: His behavior is normal. Judgment normal.    Assessment/Plan: Abdominal pain and umbilical hernia  The CT scan initially showed that just the side wall of the small bowel was involved with the hernia but on physical examination I can palpate down through the fascial defect and fine no incarcerated bowel. Still, this may of been the source of his 2 weeks of abdominal discomfort although it still could be an ileus from his spinal compression fracture. At this point, there are no plans for operative intervention unless he acutely worsens. We will follow him closely with you.  Sharla Tankard A 08/19/2015, 9:41 PM

## 2015-08-19 NOTE — H&P (Signed)
History and Physical    [name] [name]. WUJ:[id number] DOB: January 19, 1941 DOA: 08/19/2015  Referring MD/NP/PA:   PCP: [name], NP   Patient coming from:  The patient is coming from home.  At baseline, pt is independent for most of ADL.     Chief Complaint: Abdominal pain, chest pressure, back pain  HPI: [name] [name]. is a 75 y.o. male with medical history significant of hypertension, COPD on 2 L oxygen at home, GERD, remote DVT, migraine headache, chronic dCHF, who presents with abdominal pain, chest pressure and back pain.  Patient reports that he has been having abdominal pain for more than 2 weeks. His abdominal pain is located in the upper abdomen, constant, 7 out of 10 in severity, nonradiating. It is not associated with fever, chills, nausea, vomiting. Patient states that he had some loose stool bowel movements earlier, but no bowel movement since this morning.  He also reports chronic lower back pain, which has worsened in the past several days. Currently the pain is located midline of lower back, constant, 7 out of 10 in severity. No alarming symptoms such as urinary incontinence, leg weakness or numbness and loss control of bowel movement.   Patient states that he has intermittent chest pressure for a long time, which happens once or twice per month. It happens again today. Now he has chest pressure. It is located in the substernal area, 3 out of 10 in severity, nonradiating. He has mild cough and mild shortness of breath due to COPD, which is at baseline. Patient denies symptoms of UTI or unilateral weakness.  ED Course: pt was found to have elevated troponin 0.04-->0.04, lipase 24, BNP 98.3, negative urinalysis, WBC 9.7, temperature normal, slightly tachycardia, digitalized and renal function okay. Pt is placed on tele bed for obs. General surgery was consulted.  # CT abdomen/pelvis showed: 1. Richter periumbilical hernia contains a small margin of the small bowel and  some adjacent fluid. Inflammation or injury of the small bowel wall is not excluded but there is no bowel dilatation. 2. New superior endplate compression fracture of T9. 3. Markedly severe emphysema at the lung bases. There is some bandlike nodularity at the right lung base but other areas of nodularity of both lung bases have resolved compared to the prior CT chest from 2016. 4. Sigmoid colon diverticulosis. 5. Aortoiliac atherosclerotic vascular disease. Right coronary artery atherosclerotic calcification.  Review of Systems:   General: no fevers, chills, no changes in body weight, has poor appetite, has fatigue HEENT: no blurry vision, hearing changes or sore throat Pulm: has dyspnea, coughing, no wheezing CV: has chest pressure, no palpitations Abd: no nausea, vomiting, has abdominal pain, no diarrhea, constipation GU: no dysuria, burning on urination, increased urinary frequency, hematuria  Ext: no leg edema Neuro: no unilateral weakness, numbness, or tingling, no vision change or hearing loss Skin: no rash MSK: has lower back pain. Heme: No easy bruising.  Travel history: No recent long distant travel.  Allergy:  Allergies  Allergen Reactions  . Advair Diskus [Fluticasone-Salmeterol] Other (See Comments)    Blurred vision  . Brovana [Arformoterol]     Ineffective--exacerbated coughing/congestion  . Codeine Other (See Comments)    Hallucinations, blurred vision  . Ferrous Sulfate Other (See Comments)    Stomach pain  . Other Swelling    Bicillin injection in 1961--swelling in lower extremities and joints, and whelps up arm.  Has taken penicillin since w/o reaction.  . Prednisone Other (See Comments)  Stomach pain    Past Medical History  Diagnosis Date  . COPD (chronic obstructive pulmonary disease) (HCC)   . Chronic bronchitis     "get it q yr" (07/17/2013)  . Hypertension   . DVT (deep venous thrombosis) (HCC) 1990's    "?LLE"  . On home oxygen therapy     "2L  24/7" (05/06/2015)  . GERD (gastroesophageal reflux disease)     "related to RX I take" (07/17/2013)  . Daily headache   . Migraine     "all my life; no really bad migraines in 8-9 since work stress is gone" (07/17/2013)  . Pneumonia     "several times"  . Acute respiratory failure with hypoxia (HCC) 01/2014  . Chronic diastolic CHF (congestive heart failure) [facility])     Past Surgical History  Procedure Laterality Date  . Tonsillectomy  1949  . Inguinal hernia repair Left ~ 1990  . Cataract extraction w/ intraocular lens implant Right 05/05/2015    Social History:  reports that he quit smoking about 17 years ago. His smoking use included Cigarettes. He has a 10 pack-year smoking history. He has never used smokeless tobacco. He reports that he does not drink alcohol or use illicit drugs.  Family History:  Family History  Problem Relation Age of Onset  . Breast cancer Mother   . Alzheimer's disease Father      Prior to Admission medications   Medication Sig Start Date End Date Taking? Authorizing Provider  albuterol (PROVENTIL HFA;VENTOLIN HFA) 108 (90 BASE) MCG/ACT inhaler Inhale 2 puffs into the lungs every 6 (six) hours as needed for wheezing or shortness of breath. 04/13/14   Storm Frisk, MD  ALPRAZolam Prudy Feeler) 0.5 MG tablet Take 0.5 mg by mouth 3 (three) times daily as needed for anxiety.  04/06/14   Historical Provider, MD  Bromfenac Sodium (PROLENSA) 0.07 % SOLN Place 1 drop into the right eye daily.    Historical Provider, MD  Calcium Carbonate Antacid (ANTACID PO) Take by mouth.    Historical Provider, MD  dextromethorphan-guaiFENesin (MUCINEX DM) 30-600 MG 12hr tablet Take 1 tablet by mouth 2 (two) times daily. 05/10/15   Ruben Im, MD  ferrous sulfate 325 (65 FE) MG tablet Take 1 tablet (325 mg total) by mouth daily with breakfast. 05/11/15   Ruben Im, MD  furosemide (LASIX) 40 MG tablet Take 20 mg by mouth daily.    Historical Provider, MD  ipratropium-albuterol (DUONEB)  0.5-2.5 (3) MG/3ML SOLN Take 3 mLs by nebulization every 6 (six) hours as needed. 05/12/14   Albertine Grates, MD  mometasone-formoterol (DULERA) 200-5 MCG/ACT AERO Inhale 2 puffs into the lungs 2 (two) times daily.    Historical Provider, MD  montelukast (SINGULAIR) 10 MG tablet Take 1 tablet (10 mg total) by mouth at bedtime. 05/12/14   Albertine Grates, MD  ofloxacin (OCUFLOX) 0.3 % ophthalmic solution Place 1 drop into the right eye 2 (two) times daily.    Historical Provider, MD  pantoprazole (PROTONIX) 40 MG tablet Take 1 tablet (40 mg total) by mouth 2 (two) times daily. 11/01/13   Vassie Loll, MD  sodium chloride (OCEAN) 0.65 % SOLN nasal spray Place 1 spray into the nose as needed for congestion (Dry nasal passages).    Historical Provider, MD  SPIRIVA HANDIHALER 18 MCG inhalation capsule PLACE 1 CAPSULE (18 MCG TOTAL) INTO INHALER AND INHALE DAILY. Patient not taking: Reported on 05/06/2015 11/12/13   Storm Frisk, MD  vitamin B-12 1000 MCG tablet  Take 1 tablet (1,000 mcg total) by mouth daily. 05/10/15   Ruben Im, MD    Physical Exam: Filed Vitals:   08/19/15 1645 08/19/15 1845 08/19/15 1930 08/19/15 2115  BP: 153/90 153/96 167/95 136/96  Pulse: 94 88 97 87  Temp:      TempSrc:      Resp: 22 20 16 23   SpO2: 98% 97% 97% 98%   General: Not in acute distress HEENT:       Eyes: PERRL, EOMI, no scleral icterus.       ENT: No discharge from the ears and nose, no pharynx injection, no tonsillar enlargement.        Neck: No JVD, no bruit, no mass felt. Heme: No neck lymph node enlargement. Cardiac: S1/S2, RRR, No murmurs, No gallops or rubs. Pulm: has decreased air movement bilaterally. No rales, wheezing, rhonchi or rubs. Abd: Soft, nondistended, tenderness over upper abdomen, no rebound pain, no organomegaly, BS present. GU: No hematuria Ext: 1+ leg edema bilaterally. 2+DP/PT pulse bilaterally. Musculoskeletal: No joint deformities, No joint redness or warmth, no limitation of ROM in spin. Skin:  No rashes.  Neuro: Alert, oriented X3, cranial nerves II-XII grossly intact, moves all extremities normally. Psych: Patient is not psychotic, no suicidal or hemocidal ideation.  Labs on Admission: I have personally reviewed following labs and imaging studies  CBC:  Recent Labs Lab 08/19/15 1355  WBC 9.7  HGB 10.0*  HCT 37.5*  MCV 93.1  PLT 329   Basic Metabolic Panel:  Recent Labs Lab 08/19/15 1355  NA 141  K 3.7  CL 100*  CO2 33*  GLUCOSE 105*  BUN 12  CREATININE 0.99  CALCIUM 9.3   GFR: CrCl cannot be calculated (Unknown ideal weight.). Liver Function Tests:  Recent Labs Lab 08/19/15 1355  AST 21  ALT 13*  ALKPHOS 63  BILITOT 0.2*  PROT 6.1*  ALBUMIN 3.4*    Recent Labs Lab 08/19/15 1355  LIPASE 24   No results for input(s): AMMONIA in the last 168 hours. Coagulation Profile:  Recent Labs Lab 08/19/15 2118  INR 0.95   Cardiac Enzymes:  Recent Labs Lab 08/19/15 1708 08/19/15 1948 08/19/15 2118  TROPONINI 0.04* 0.04* 0.04*   BNP (last 3 results) No results for input(s): PROBNP in the last 8760 hours. HbA1C: No results for input(s): HGBA1C in the last 72 hours. CBG: No results for input(s): GLUCAP in the last 168 hours. Lipid Profile: No results for input(s): CHOL, HDL, LDLCALC, TRIG, CHOLHDL, LDLDIRECT in the last 72 hours. Thyroid Function Tests: No results for input(s): TSH, T4TOTAL, FREET4, T3FREE, THYROIDAB in the last 72 hours. Anemia Panel: No results for input(s): VITAMINB12, FOLATE, FERRITIN, TIBC, IRON, RETICCTPCT in the last 72 hours. Urine analysis:    Component Value Date/Time   COLORURINE YELLOW 08/19/2015 1716   APPEARANCEUR CLEAR 08/19/2015 1716   LABSPEC 1.025 08/19/2015 1716   PHURINE 8.0 08/19/2015 1716   GLUCOSEU NEGATIVE 08/19/2015 1716   HGBUR NEGATIVE 08/19/2015 1716   BILIRUBINUR NEGATIVE 08/19/2015 1716   KETONESUR NEGATIVE 08/19/2015 1716   PROTEINUR NEGATIVE 08/19/2015 1716   UROBILINOGEN 0.2  10/29/2013 2011   NITRITE NEGATIVE 08/19/2015 1716   LEUKOCYTESUR NEGATIVE 08/19/2015 1716   Sepsis Labs: @LABRCNTIP (procalcitonin:4,lacticidven:4) )No results found for this or any previous visit (from the past 240 hour(s)).   Radiological Exams on Admission: Ct Abdomen Pelvis W Contrast  08/19/2015  CLINICAL DATA:  Abdominal and back pain for 2 weeks. Bloating of the abdomen and distention. EXAM: CT  ABDOMEN AND PELVIS WITH CONTRAST TECHNIQUE: Multidetector CT imaging of the abdomen and pelvis was performed using the standard protocol following bolus administration of intravenous contrast. CONTRAST:  100mL ISOVUE-300 IOPAMIDOL (ISOVUE-300) INJECTION 61% COMPARISON:  Abdominal radiograph 05/06/2015 FINDINGS: Lower chest: Markedly severe emphysema at the lung bases. Bandlike nodularity in the right lower lobe and right middle lobe, some of the prior scattered nodularity in the lower lobes has resolved in the antrum. Right coronary artery atherosclerotic calcification. Hepatobiliary: Unremarkable Pancreas: Unremarkable Spleen: Unremarkable Adrenals/Urinary Tract: Adrenal glands normal. 9 mm cyst of the right mid to lower kidney, image 38/201. 1.7 by 1.6 cm simple appearing cyst of the left mid kidney, image 35/201. No stones or hydronephrosis. Urinary bladder unremarkable. Stomach/Bowel: Sigmoid colon diverticulosis, no active diverticulitis. Umbilical hernia contains fluid in a small margin of small bowel on image 87/204 [place](Richter type hernia). No small bowel dilatation. Vascular/Lymphatic: Aortoiliac atherosclerotic vascular disease. No pathologic adenopathy. Reproductive: Mildly prominent prostate gland, 5 point 4 by 4.2 cm Other: No supplemental non-categorized findings. Musculoskeletal: Bridging spurring of the right sacroiliac joint. Superior endplate compression fracture at T9, new compared to 05/10/2014 and likely new compared to 05/06/15. Remote inferior endplate compression at L3. IMPRESSION: 1.  Richter periumbilical hernia contains a small margin of the small bowel and some adjacent fluid. Inflammation or injury of the small bowel wall is not excluded but there is no bowel dilatation. 2. New superior endplate compression fracture of T9 3. Markedly severe emphysema at the lung bases. There is some bandlike nodularity at the right lung base but other areas of nodularity of both lung bases have resolved compared to the prior CT chest from 2016. 4. Sigmoid colon diverticulosis. 5. Aortoiliac atherosclerotic vascular disease. Right coronary artery atherosclerotic calcification. Electronically Signed   By: Gaylyn RongWalter  Liebkemann M.D.   On: 08/19/2015 18:07     EKG: Independently reviewed. Sinus rhythm, QTC 441, low voltage, nonspecific T-wave change.  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 Troponin is mildly elevated 0.04-->0.04. He may have underlying CAD. Will do chest pain r/o.   - will place on Tele bed for obs - cycle CE q6 x3 and repeat her EKG in the am  - prn Nitroglycerin, Morphine - Start aspirin, lipitor  - Risk factor stratification: will check FLP and A1C  - 2d echo - please call Card in AM  Abdominal pain: lipase normal. CT scan showed a possible Richter hernia at the umbilicus. General surgeon, Dr. Magnus IvanBlackman was consulted, no plans for operative intervention unless he acutely worsens per Dr. Magnus IvanBlackman. -Highly appreciate Dr. Eliberto IvoryBlackman's consultation -Pain control: When necessary morphine and  Percocet -When necessary Zofran for nausea  Back pain: CT abdomen and pelvis showed new superior endplate compression fracture of T9, which  is likely the reason. No alarming symptoms. -pain control: When necessary morphine and  Percocet  HTN: -continue home HCTZ and lasix, but will start in AM -IV hydralazine when necessary  GERD: -Protonix  COPD: stable. No wheezing or rhonchi on auscultation. -Dulera inhaler, Duonebs, Spiriva inhaler, sigulair -prn albuterol nebulizer -Mucinex for cough  Chronic diastolic CHF (congestive heart failure) (HCC): 2-D echo 10/26/08 showed EF 50-60  percent with grade 1 diastolic dysfunction. Patient is on low-dose Lasix 20 mg daily. Patient has 1+ leg edema, but no JVD. BNP 98.3. CHF is compensated on admission. -Continue home Lasix 20 mg daily -Started aspirin for chest pressure.  DVT ppx: SQ Heparin (if pt develops severe chest pain or significantly elevated trop, will be easier to switch to IV heparin or stop heparin for procedure than using Lovenox).  Code Status: Full code Family Communication: None at bed side. Disposition Plan:  Anticipate discharge back to previous home environment Consults called:  Gen. surgeon, Dr. Magnus Ivan Admission status: Obs / tele  Date of Service 08/19/2015    Lorretta Harp [facility] Pager [phone]  If 7PM-7AM, please contact night-coverage [email or web address] Password [facility] 08/19/2015, 10:26 PM

## 2015-08-19 NOTE — ED Notes (Signed)
Pt here with 14 day history of back pain and abdominal pain. Pt reports a pressure in abdomen and bloating that is causing him to be short of breath. Pt has been seen at PCP twice for same, and was advised that if symptoms persisted, to come here. Abdomen distended. Last BM yesterday. Pt endorsing nausea after eating.

## 2015-08-20 ENCOUNTER — Observation Stay (HOSPITAL_BASED_OUTPATIENT_CLINIC_OR_DEPARTMENT_OTHER): Payer: Medicare Other

## 2015-08-20 ENCOUNTER — Other Ambulatory Visit: Payer: Self-pay

## 2015-08-20 DIAGNOSIS — I5032 Chronic diastolic (congestive) heart failure: Secondary | ICD-10-CM

## 2015-08-20 DIAGNOSIS — R079 Chest pain, unspecified: Secondary | ICD-10-CM

## 2015-08-20 DIAGNOSIS — R7989 Other specified abnormal findings of blood chemistry: Secondary | ICD-10-CM | POA: Diagnosis not present

## 2015-08-20 DIAGNOSIS — K420 Umbilical hernia with obstruction, without gangrene: Secondary | ICD-10-CM | POA: Diagnosis not present

## 2015-08-20 DIAGNOSIS — R0789 Other chest pain: Secondary | ICD-10-CM | POA: Diagnosis not present

## 2015-08-20 DIAGNOSIS — J449 Chronic obstructive pulmonary disease, unspecified: Secondary | ICD-10-CM | POA: Diagnosis not present

## 2015-08-20 DIAGNOSIS — M4854XA Collapsed vertebra, not elsewhere classified, thoracic region, initial encounter for fracture: Secondary | ICD-10-CM | POA: Diagnosis not present

## 2015-08-20 LAB — TROPONIN I
Troponin I: 0.04 ng/mL (ref <0.03)
Troponin I: 0.03 ng/mL (ref <0.03)

## 2015-08-20 LAB — LIPID PANEL
CHOLESTEROL: 189 mg/dL (ref 0–200)
HDL: 66 mg/dL (ref >40)
LDL Cholesterol: 70 mg/dL (ref 0–99)
TRIGLYCERIDES: 263 mg/dL — AB (ref <150)
Total CHOL/HDL Ratio: 2.9 RATIO
VLDL: 53 mg/dL — ABNORMAL HIGH (ref 0–40)

## 2015-08-20 LAB — ECHOCARDIOGRAM COMPLETE
Height: 67 in
WEIGHTICAEL: 2892.8 [oz_av]

## 2015-08-20 MED ORDER — ALUM & MAG HYDROXIDE-SIMETH 200-200-20 MG/5ML PO SUSP
30.0000 mL | ORAL | Status: DC | PRN
  Administered 2015-08-20 – 2015-08-23 (×15): 30 mL via ORAL
  Filled 2015-08-20 (×15): qty 30

## 2015-08-20 MED ORDER — ASPIRIN EC 81 MG PO TBEC
81.0000 mg | DELAYED_RELEASE_TABLET | Freq: Every day | ORAL | Status: DC
  Administered 2015-08-21 – 2015-08-23 (×3): 81 mg via ORAL
  Filled 2015-08-20 (×3): qty 1

## 2015-08-20 MED ORDER — CETYLPYRIDINIUM CHLORIDE 0.05 % MT LIQD
7.0000 mL | Freq: Two times a day (BID) | OROMUCOSAL | Status: DC
  Administered 2015-08-20 – 2015-08-23 (×5): 7 mL via OROMUCOSAL

## 2015-08-20 NOTE — Progress Notes (Signed)
  Subjective: C/O some wheeze, no nausea, some epigastric pain, no periumbilical pain at this time  Objective: Vital signs in last 24 hours: Temp:  [98 F (36.7 C)-98.2 F (36.8 C)] 98.1 F (36.7 C) (07/14 0512) Pulse Rate:  [85-106] 85 (07/14 0512) Resp:  [16-24] 20 (07/14 0512) BP: (127-167)/(69-96) 127/69 mmHg (07/14 0512) SpO2:  [93 %-98 %] 98 % (07/14 0843) Weight:  [82.01 kg (180 lb 12.8 oz)-82.283 kg (181 lb 6.4 oz)] 82.01 kg (180 lb 12.8 oz) (07/14 0512) Last BM Date: 08/19/15  Intake/Output from previous day: 07/13 0701 - 07/14 0700 In: -  Out: 550 [Urine:550] Intake/Output this shift:    General appearance: cooperative Resp: rales bilaterally and wheezes bilaterally GI: soft, umbilical hernia remains reduced, fascial defect palpable, no sig tenderness  Active BS Lab Results:   Recent Labs  08/19/15 1355  WBC 9.7  HGB 10.0*  HCT 37.5*  PLT 329   BMET  Recent Labs  08/19/15 1355  NA 141  K 3.7  CL 100*  CO2 33*  GLUCOSE 105*  BUN 12  CREATININE 0.99  CALCIUM 9.3   PT/INR  Recent Labs  08/19/15 2118  LABPROT 12.9  INR 0.95   ABG No results for input(s): PHART, HCO3 in the last 72 hours.  Invalid input(s): PCO2, PO2  Anti-infectives: Anti-infectives    None      Assessment/Plan: Umbilical hernia - remains reduced. Recommend clears and advance diet as tolerated. May consider elective repair once in better shape from a medical standpoint. We will follow.      Eamonn Sermeno E 08/20/2015

## 2015-08-20 NOTE — Progress Notes (Signed)
PROGRESS NOTE    [name] [name].  [name] [name] DOB: [birth year] DOA: 08/19/2015 PCP: [name], NP   Brief Narrative:  [name] Komatsu. is a 75 y.o. male with medical history significant of hypertension, COPD on 2 L oxygen at home, GERD, remote DVT, migraine headache, chronic dCHF, who presents with abdominal pain, chest pressure and back pain. Patient reports that he has been having abdominal pain for more than 2 weeks. His abdominal pain is located in the upper abdomen, constant, 7 out of 10 in severity, nonradiating. He also reports chronic lower back pain, which has worsened in the past several days.  Patient states that he has intermittent chest pressure for a long time, which happens once or twice per month FOund to have Richters periumbilical hernia with small margin of bowel and T9 fracture  Assessment & Plan:  Abdominal pain due to umbilical hernia -now reduced -CCS following, ? Timing of surgery  -IVF, supportive care,   Chest pressure  -resolved, Troponin 0.04, no evidence of ACS, EKG unchanged -check ECHO -last stress 2years ago was normal, get results from Dr.Ganji's office  T9 fracture -pain control, physical therapy eval  Severe COPD -with rare wheezes, duonebs scheduled and PRN  HTN: -continue home  lasix -IV hydralazine when necessary  GERD: -Protonix  Chronic diastolic CHF (congestive heart failure) (HCC): 2-D echo 10/26/08 showed EF 50-60 percent with grade 1 diastolic dysfunction. Patient is on low-dose Lasix 20 mg daily. Patient has 1+ leg edema, but no JVD. BNP 98.3. CHF is compensated on admission. -Continue home Lasix 20 mg daily  DVT ppx: SQ Heparin  Code Status: Full code Family Communication: None at bed side. Disposition Plan: home in 1-2days if improved  Consultants:   CCS   Subjective: C/o abd and back pain  Objective: Filed Vitals:   08/20/15 0154 08/20/15 0512 08/20/15 0843 08/20/15 0933  BP: 133/74 127/69  148/83   Pulse: 92 85  97  Temp: 98 F (36.7 C) 98.1 F (36.7 C)    TempSrc: Oral Oral    Resp: 20 20    Height:      Weight:  82.01 kg (180 lb 12.8 oz)    SpO2: 96% 96% 98%     Intake/Output Summary (Last 24 hours) at 08/20/15 0943 Last data filed at 08/20/15 0513  Gross per 24 hour  Intake      0 ml  Output    550 ml  Net   -550 ml   Filed Weights   08/19/15 2233 08/20/15 0512  Weight: 82.283 kg (181 lb 6.4 oz) 82.01 kg (180 lb 12.8 oz)    Examination:  General exam: Appears calm and comfortable  Respiratory system: rare wheezes. Cardiovascular system: S1 & S2 heard, RRR. No JVD, murmurs, rubs, gallops or clicks. No pedal edema. Gastrointestinal system: Abdomen is nondistended, soft and nontender. No organomegaly or masses felt. Normal bowel sounds heard. Central nervous system: Alert and oriented. No focal neurological deficits. Extremities: Symmetric 5 x 5 power. Skin: No rashes, lesions or ulcers Psychiatry: Judgement and insight appear normal. Mood & affect appropriate.     Data Reviewed: I have personally reviewed following labs and imaging studies  CBC:  Recent Labs Lab 08/19/15 1355  WBC 9.7  HGB 10.0*  HCT 37.5*  MCV 93.1  PLT 329   Basic Metabolic Panel:  Recent Labs Lab 08/19/15 1355  NA 141  K 3.7  CL 100*  CO2 33*  GLUCOSE 105*  BUN 12  CREATININE 0.99  CALCIUM 9.3   GFR: Estimated Creatinine Clearance: 67.1 mL/min (by C-G formula based on Cr of 0.99). Liver Function Tests:  Recent Labs Lab 08/19/15 1355  AST 21  ALT 13*  ALKPHOS 63  BILITOT 0.2*  PROT 6.1*  ALBUMIN 3.4*    Recent Labs Lab 08/19/15 1355  LIPASE 24   No results for input(s): AMMONIA in the last 168 hours. Coagulation Profile:  Recent Labs Lab 08/19/15 2118  INR 0.95   Cardiac Enzymes:  Recent Labs Lab 08/19/15 1708 08/19/15 1948 08/19/15 2118 08/20/15 0246 08/20/15 0823  TROPONINI 0.04* 0.04* 0.04* 0.04* 0.03*   BNP (last 3 results) No  results for input(s): PROBNP in the last 8760 hours. HbA1C: No results for input(s): HGBA1C in the last 72 hours. CBG:  Recent Labs Lab 08/19/15 2311  GLUCAP 93   Lipid Profile:  Recent Labs  08/20/15 0246  CHOL 189  HDL 66  LDLCALC 70  TRIG 263*  CHOLHDL 2.9   Thyroid Function Tests: No results for input(s): TSH, T4TOTAL, FREET4, T3FREE, THYROIDAB in the last 72 hours. Anemia Panel: No results for input(s): VITAMINB12, FOLATE, FERRITIN, TIBC, IRON, RETICCTPCT in the last 72 hours. Urine analysis:    Component Value Date/Time   COLORURINE YELLOW 08/19/2015 1716   APPEARANCEUR CLEAR 08/19/2015 1716   LABSPEC 1.025 08/19/2015 1716   PHURINE 8.0 08/19/2015 1716   GLUCOSEU NEGATIVE 08/19/2015 1716   HGBUR NEGATIVE 08/19/2015 1716   BILIRUBINUR NEGATIVE 08/19/2015 1716   KETONESUR NEGATIVE 08/19/2015 1716   PROTEINUR NEGATIVE 08/19/2015 1716   UROBILINOGEN 0.2 10/29/2013 2011   NITRITE NEGATIVE 08/19/2015 1716   LEUKOCYTESUR NEGATIVE 08/19/2015 1716   Sepsis Labs: @LABRCNTIP (procalcitonin:4,lacticidven:4)  )No results found for this or any previous visit (from the past 240 hour(s)).       Radiology Studies: Ct Abdomen Pelvis W Contrast  08/19/2015  CLINICAL DATA:  Abdominal and back pain for 2 weeks. Bloating of the abdomen and distention. EXAM: CT ABDOMEN AND PELVIS WITH CONTRAST TECHNIQUE: Multidetector CT imaging of the abdomen and pelvis was performed using the standard protocol following bolus administration of intravenous contrast. CONTRAST:  ISOVUE-300 IOPAMIDOL (ISOVUE-300) INJECTION 61% COMPARISON:  Abdominal radiograph 05/06/2015 FINDINGS: Lower chest: Markedly severe emphysema at the lung bases. Bandlike nodularity in the right lower lobe and right middle lobe, some of the prior scattered nodularity in the lower lobes has resolved in the antrum. Right coronary artery atherosclerotic calcification. Hepatobiliary: Unremarkable Pancreas: Unremarkable  Spleen: Unremarkable Adrenals/Urinary Tract: Adrenal glands normal. 9 mm cyst of the right mid to lower kidney, image 38/201. 1.7 by 1.6 cm simple appearing cyst of the left mid kidney, image 35/201. No stones or hydronephrosis. Urinary bladder unremarkable. Stomach/Bowel: Sigmoid colon diverticulosis, no active diverticulitis. Umbilical hernia contains fluid in a small margin of small bowel on image 87/204 Hal Hope type hernia). No small bowel dilatation. Vascular/Lymphatic: Aortoiliac atherosclerotic vascular disease. No pathologic adenopathy. Reproductive: Mildly prominent prostate gland, 5 point 4 by 4.2 cm Other: No supplemental non-categorized findings. Musculoskeletal: Bridging spurring of the right sacroiliac joint. Superior endplate compression fracture at T9, new compared to 05/10/2014 and likely new compared to 05/06/15. Remote inferior endplate compression at L3. IMPRESSION: 1. Richter periumbilical hernia contains a small margin of the small bowel and some adjacent fluid. Inflammation or injury of the small bowel wall is not excluded but there is no bowel dilatation. 2. New superior endplate compression fracture of T9 3. Markedly severe emphysema at the lung bases. There is some  bandlike nodularity at the right lung base but other areas of nodularity of both lung bases have resolved compared to the prior CT chest from 2016. 4. Sigmoid colon diverticulosis. 5. Aortoiliac atherosclerotic vascular disease. Right coronary artery atherosclerotic calcification. Electronically Signed   By: Gaylyn RongWalter  Liebkemann M.D.   On: 08/19/2015 18:07        Scheduled Meds: . antiseptic oral rinse  7 mL Mouth Rinse BID  . aspirin EC  325 mg Oral Daily  . atorvastatin  40 mg Oral q1800  . dextromethorphan-guaiFENesin  1 tablet Oral BID  . ferrous sulfate  325 mg Oral Q breakfast  . furosemide  20 mg Oral Daily  . heparin subcutaneous  5,000 Units Subcutaneous Q8H  . hydrochlorothiazide  25 mg Oral Daily  .  ipratropium-albuterol  3 mL Nebulization Q6H  . ketorolac  1 drop Right Eye QID  . mometasone-formoterol  2 puff Inhalation BID  . montelukast  10 mg Oral QHS  . multivitamin with minerals  1 tablet Oral Daily  . ofloxacin  1 drop Right Eye BID  . pantoprazole  40 mg Oral BID  . tiotropium  18 mcg Inhalation Daily   Continuous Infusions:       Time spent: 35min    Zannie CovePreetha Kamesha Herne, MD [facility] Pager [phone]  If 7PM-7AM, please contact night-coverage [email or web address] Password [email or web address] 08/20/2015, 9:43 AM

## 2015-08-20 NOTE — Care Management Obs Status (Signed)
MEDICARE OBSERVATION STATUS NOTIFICATION   Patient Details  Name: [name] Morningddie Mcauliff Jr. MRN: [id number] Date of Birth: [birth year]   Medicare Observation Status Notification Given:  Yes    Cherrie DistanceChandler, Lachlan Pelto L, RN 08/20/2015, 1:58 PM

## 2015-08-20 NOTE — Progress Notes (Signed)
Refusing bed alarm  

## 2015-08-20 NOTE — Progress Notes (Signed)
  Echocardiogram 2D Echocardiogram has been performed.  Carsin Randazzo 08/20/2015, 11:25 AM
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
## 2015-08-21 DIAGNOSIS — I5032 Chronic diastolic (congestive) heart failure: Secondary | ICD-10-CM | POA: Diagnosis not present

## 2015-08-21 DIAGNOSIS — J449 Chronic obstructive pulmonary disease, unspecified: Secondary | ICD-10-CM

## 2015-08-21 DIAGNOSIS — R0789 Other chest pain: Secondary | ICD-10-CM | POA: Diagnosis not present

## 2015-08-21 DIAGNOSIS — R7989 Other specified abnormal findings of blood chemistry: Secondary | ICD-10-CM | POA: Diagnosis not present

## 2015-08-21 LAB — CBC
HCT: 36 % — ABNORMAL LOW (ref 39.0–52.0)
Hemoglobin: 10.4 g/dL — ABNORMAL LOW (ref 13.0–17.0)
MCH: 25.4 pg — AB (ref 26.0–34.0)
MCHC: 28.9 g/dL — AB (ref 30.0–36.0)
MCV: 87.8 fL (ref 78.0–100.0)
PLATELETS: 335 10*3/uL (ref 150–400)
RBC: 4.1 MIL/uL — ABNORMAL LOW (ref 4.22–5.81)
RDW: 16.2 % — AB (ref 11.5–15.5)
WBC: 10 10*3/uL (ref 4.0–10.5)

## 2015-08-21 LAB — BASIC METABOLIC PANEL
ANION GAP: 11 (ref 5–15)
BUN: 10 mg/dL (ref 6–20)
CALCIUM: 9.3 mg/dL (ref 8.9–10.3)
CO2: 27 mmol/L (ref 22–32)
CREATININE: 0.78 mg/dL (ref 0.61–1.24)
Chloride: 98 mmol/L — ABNORMAL LOW (ref 101–111)
GFR calc Af Amer: >60 mL/min (ref >60)
GLUCOSE: 99 mg/dL (ref 65–99)
Potassium: 3.5 mmol/L (ref 3.5–5.1)
Sodium: 136 mmol/L (ref 135–145)

## 2015-08-21 LAB — HEMOGLOBIN A1C
HEMOGLOBIN A1C: 5.9 % — AB (ref 4.8–5.6)
Mean Plasma Glucose: 123 mg/dL

## 2015-08-21 MED ORDER — LIDOCAINE 5 % EX PTCH
1.0000 | MEDICATED_PATCH | CUTANEOUS | Status: DC
  Administered 2015-08-21 – 2015-08-23 (×3): 1 via TRANSDERMAL
  Filled 2015-08-21 (×3): qty 1

## 2015-08-21 MED ORDER — FUROSEMIDE 10 MG/ML IJ SOLN
40.0000 mg | Freq: Two times a day (BID) | INTRAMUSCULAR | Status: AC
  Administered 2015-08-21 (×2): 40 mg via INTRAVENOUS
  Filled 2015-08-21 (×2): qty 4

## 2015-08-21 MED ORDER — POTASSIUM CHLORIDE CRYS ER 20 MEQ PO TBCR
40.0000 meq | EXTENDED_RELEASE_TABLET | Freq: Two times a day (BID) | ORAL | Status: DC
  Administered 2015-08-21 – 2015-08-23 (×5): 40 meq via ORAL
  Filled 2015-08-21 (×6): qty 2

## 2015-08-21 MED ORDER — POLYETHYLENE GLYCOL 3350 17 G PO PACK
17.0000 g | PACK | Freq: Every day | ORAL | Status: DC
  Administered 2015-08-21: 17 g via ORAL
  Filled 2015-08-21 (×2): qty 1

## 2015-08-21 MED ORDER — IPRATROPIUM-ALBUTEROL 0.5-2.5 (3) MG/3ML IN SOLN
3.0000 mL | Freq: Three times a day (TID) | RESPIRATORY_TRACT | Status: DC
  Administered 2015-08-22 – 2015-08-23 (×4): 3 mL via RESPIRATORY_TRACT
  Filled 2015-08-21 (×4): qty 3

## 2015-08-21 NOTE — Progress Notes (Signed)
CCS/[facility] Progress Note    Subjective: Patient continues to do well from surgical standpoint.  Umbilical hernia reduced  Objective: Vital signs in last 24 hours: Temp:  [97.8 F (36.6 C)-98.2 F (36.8 C)] 97.8 F (36.6 C) (07/15 0502) Pulse Rate:  [95-100] 98 (07/15 0805) Resp:  [18-23] 19 (07/15 0805) BP: (123-151)/(80-89) 151/89 mmHg (07/15 0502) SpO2:  [95 %-98 %] 96 % (07/15 0805) Weight:  [80.513 kg (177 lb 8 oz)] 80.513 kg (177 lb 8 oz) (07/15 0502) Last BM Date: 08/20/15  Intake/Output from previous day: 07/14 0701 - 07/15 0700 In: 800 [P.O.:800] Out: 1425 [Urine:1425] Intake/Output this shift: Total I/O In: 120 [P.O.:120] Out: 250 [Urine:250]  General: No distress.  Lungs: COPD, on oxygen.  Not short of breath.  Currently not on steroids.  Does use oxygen at home.  Abd: Distended, good bowel sounds.  Extremities: Nochanges  Neuro: Intact  Lab Results:  (wbc:2,hgb:2,hct:2,plt:2) BMET ) Recent Labs  08/19/15 1355 08/21/15 0411  NA 141 136  K 3.7 3.5  CL 100* 98*  CO2 33* 27  GLUCOSE 105* 99  BUN 12 10  CREATININE 0.99 0.78  CALCIUM 9.3 9.3   PT/INR  Recent Labs  08/19/15 2118  LABPROT 12.9  INR 0.95   ABG No results for input(s): PHART, HCO3 in the last 72 hours.  Invalid input(s): PCO2, PO2  Studies/Results: Ct Abdomen Pelvis W Contrast  08/19/2015  CLINICAL DATA:  Abdominal and back pain for 2 weeks. Bloating of the abdomen and distention. EXAM: CT ABDOMEN AND PELVIS WITH CONTRAST TECHNIQUE: Multidetector CT imaging of the abdomen and pelvis was performed using the standard protocol following bolus administration of intravenous contrast. CONTRAST:  ISOVUE-300 IOPAMIDOL (ISOVUE-300) INJECTION 61% COMPARISON:  Abdominal radiograph 05/06/2015 FINDINGS: Lower chest: Markedly severe emphysema at the lung bases. Bandlike nodularity in the right lower lobe and right middle lobe, some of the prior scattered nodularity in the lower  lobes has resolved in the antrum. Right coronary artery atherosclerotic calcification. Hepatobiliary: Unremarkable Pancreas: Unremarkable Spleen: Unremarkable Adrenals/Urinary Tract: Adrenal glands normal. 9 mm cyst of the right mid to lower kidney, image 38/201. 1.7 by 1.6 cm simple appearing cyst of the left mid kidney, image 35/201. No stones or hydronephrosis. Urinary bladder unremarkable. Stomach/Bowel: Sigmoid colon diverticulosis, no active diverticulitis. Umbilical hernia contains fluid in a small margin of small bowel on image 87/204 Hal Hope type hernia). No small bowel dilatation. Vascular/Lymphatic: Aortoiliac atherosclerotic vascular disease. No pathologic adenopathy. Reproductive: Mildly prominent prostate gland, 5 point 4 by 4.2 cm Other: No supplemental non-categorized findings. Musculoskeletal: Bridging spurring of the right sacroiliac joint. Superior endplate compression fracture at T9, new compared to 05/10/2014 and likely new compared to 05/06/15. Remote inferior endplate compression at L3. IMPRESSION: 1. Richter periumbilical hernia contains a small margin of the small bowel and some adjacent fluid. Inflammation or injury of the small bowel wall is not excluded but there is no bowel dilatation. 2. New superior endplate compression fracture of T9 3. Markedly severe emphysema at the lung bases. There is some bandlike nodularity at the right lung base but other areas of nodularity of both lung bases have resolved compared to the prior CT chest from 2016. 4. Sigmoid colon diverticulosis. 5. Aortoiliac atherosclerotic vascular disease. Right coronary artery atherosclerotic calcification. Electronically Signed   By: Gaylyn Rong M.D.   On: 08/19/2015 18:07    Anti-infectives: Anti-infectives    None      Assessment/Plan: s/p  Advance diet Reduced umbilical hernia in patient  at high risk for surgery.  The hernia will need to be repaired in the future, when medically  optimized. Pulmonary clearance and followup    Marta LamasJames O. Gae BonWyatt, III, MD, FACS [phone]--pager [phone]--office [facility] 08/21/2015

## 2015-08-21 NOTE — Progress Notes (Signed)
PROGRESS NOTE    [name] MorningEddie Menon Jr.  ZOX:[id number] DOB: March 20, 1940 DOA: 08/19/2015 PCP: Egbert GaribaldiMillsaps, KIMBERLY M, NP   Brief Narrative:  [name] Morningddie Benard Jr. is a 75 y.o. male with medical history significant of hypertension, COPD on 2 L oxygen at home, GERD, remote DVT, migraine headache, chronic dCHF, who presents with abdominal pain, chest pressure and back pain. Patient reports that he has been having abdominal pain for more than 2 weeks. His abdominal pain is located in the upper abdomen, constant, 7 out of 10 in severity, nonradiating. He also reports chronic lower back pain, which has worsened in the past several days.  Patient states that he has intermittent chest pressure for a long time, which happens once or twice per month FOund to have Richters periumbilical hernia with small margin of bowel and T9 fracture  Assessment & Plan:  Abdominal pain due to umbilical hernia -now reduced -CCS following, advance diet slowly, still having symptoms -per [facility] FU for Surgery down the road -Supportive care,  -add laxatives   Chest pressure  -resolved, Troponin 0.04, no evidence of ACS, EKG unchanged -2D ECHO with normal EF and wall motion -last stress 2years ago was normal, requested results from Dr.Ganji's office  T9 fracture -pain control, physical therapy eval completed -add lidocaine patch  Severe COPD -with rare wheezes, duonebs scheduled and PRN  Acute on Chronic diastolic CHF -will give 2doses lasix today -Normal EF on ECHo in 2010  GERD: -Protonix  DVT ppx: SQ Heparin  Code Status: Full code Family Communication: None at bed side. Disposition Plan: home in 1-2days if improved  Consultants:   CCS   Subjective: C/o abd and back pain  Objective: Filed Vitals:   08/21/15 0010 08/21/15 0232 08/21/15 0502 08/21/15 0805  BP: 127/80  151/89   Pulse: 95  98 98  Temp: 98.1 F (36.7 C)  97.8 F (36.6 C)   TempSrc: Oral  Oral   Resp: 21  23 19   Height:      Weight:    80.513 kg (177 lb 8 oz)   SpO2: 97% 96% 96% 96%    Intake/Output Summary (Last 24 hours) at 08/21/15 1135 Last data filed at 08/21/15 0804  Gross per 24 hour  Intake    600 ml  Output   1535 ml  Net   -935 ml   Filed Weights   08/19/15 2233 08/20/15 0512 08/21/15 0502  Weight: 82.283 kg (181 lb 6.4 oz) 82.01 kg (180 lb 12.8 oz) 80.513 kg (177 lb 8 oz)    Examination:  General exam: Appears calm and comfortable  Respiratory system: rare wheezes. Cardiovascular system: S1 & S2 heard, RRR. No JVD, murmurs, rubs, gallops or clicks. No pedal edema. Gastrointestinal system: Abdomen is nondistended, soft and nontender. No organomegaly or masses felt. Normal bowel sounds heard. Central nervous system: Alert and oriented. No focal neurological deficits. Extremities: Symmetric 5 x 5 power. Skin: No rashes, lesions or ulcers Psychiatry: Judgement and insight appear normal. Mood & affect appropriate.     Data Reviewed: I have personally reviewed following labs and imaging studies  CBC:  Recent Labs Lab 08/19/15 1355 08/21/15 0411  WBC 9.7 10.0  HGB 10.0* 10.4*  HCT 37.5* 36.0*  MCV 93.1 87.8  PLT 329 335   Basic Metabolic Panel:  Recent Labs Lab 08/19/15 1355 08/21/15 0411  NA 141 136  K 3.7 3.5  CL 100* 98*  CO2 33* 27  GLUCOSE 105* 99  BUN 12 10  CREATININE  0.99 0.78  CALCIUM 9.3 9.3   GFR: Estimated Creatinine Clearance: 82.4 mL/min (by C-G formula based on Cr of 0.78). Liver Function Tests:  Recent Labs Lab 08/19/15 1355  AST 21  ALT 13*  ALKPHOS 63  BILITOT 0.2*  PROT 6.1*  ALBUMIN 3.4*    Recent Labs Lab 08/19/15 1355  LIPASE 24   No results for input(s): AMMONIA in the last 168 hours. Coagulation Profile:  Recent Labs Lab 08/19/15 2118  INR 0.95   Cardiac Enzymes:  Recent Labs Lab 08/19/15 1708 08/19/15 1948 08/19/15 2118 08/20/15 0246 08/20/15 0823  TROPONINI 0.04* 0.04* 0.04* 0.04* 0.03*   BNP (last 3 results) No results  for input(s): PROBNP in the last 8760 hours. HbA1C: No results for input(s): HGBA1C in the last 72 hours. CBG:  Recent Labs Lab 08/19/15 2311  GLUCAP 93   Lipid Profile:  Recent Labs  08/20/15 0246  CHOL 189  HDL 66  LDLCALC 70  TRIG 263*  CHOLHDL 2.9   Thyroid Function Tests: No results for input(s): TSH, T4TOTAL, FREET4, T3FREE, THYROIDAB in the last 72 hours. Anemia Panel: No results for input(s): VITAMINB12, FOLATE, FERRITIN, TIBC, IRON, RETICCTPCT in the last 72 hours. Urine analysis:    Component Value Date/Time   COLORURINE YELLOW 08/19/2015 1716   APPEARANCEUR CLEAR 08/19/2015 1716   LABSPEC 1.025 08/19/2015 1716   PHURINE 8.0 08/19/2015 1716   GLUCOSEU NEGATIVE 08/19/2015 1716   HGBUR NEGATIVE 08/19/2015 1716   BILIRUBINUR NEGATIVE 08/19/2015 1716   KETONESUR NEGATIVE 08/19/2015 1716   PROTEINUR NEGATIVE 08/19/2015 1716   UROBILINOGEN 0.2 10/29/2013 2011   NITRITE NEGATIVE 08/19/2015 1716   LEUKOCYTESUR NEGATIVE 08/19/2015 1716   Sepsis Labs: @LABRCNTIP (procalcitonin:4,lacticidven:4)  )No results found for this or any previous visit (from the past 240 hour(s)).       Radiology Studies: Ct Abdomen Pelvis W Contrast  08/19/2015  CLINICAL DATA:  Abdominal and back pain for 2 weeks. Bloating of the abdomen and distention. EXAM: CT ABDOMEN AND PELVIS WITH CONTRAST TECHNIQUE: Multidetector CT imaging of the abdomen and pelvis was performed using the standard protocol following bolus administration of intravenous contrast. CONTRAST:  ISOVUE-300 IOPAMIDOL (ISOVUE-300) INJECTION 61% COMPARISON:  Abdominal radiograph 05/06/2015 FINDINGS: Lower chest: Markedly severe emphysema at the lung bases. Bandlike nodularity in the right lower lobe and right middle lobe, some of the prior scattered nodularity in the lower lobes has resolved in the antrum. Right coronary artery atherosclerotic calcification. Hepatobiliary: Unremarkable Pancreas: Unremarkable Spleen:  Unremarkable Adrenals/Urinary Tract: Adrenal glands normal. 9 mm cyst of the right mid to lower kidney, image 38/201. 1.7 by 1.6 cm simple appearing cyst of the left mid kidney, image 35/201. No stones or hydronephrosis. Urinary bladder unremarkable. Stomach/Bowel: Sigmoid colon diverticulosis, no active diverticulitis. Umbilical hernia contains fluid in a small margin of small bowel on image 87/204 Hal Hope type hernia). No small bowel dilatation. Vascular/Lymphatic: Aortoiliac atherosclerotic vascular disease. No pathologic adenopathy. Reproductive: Mildly prominent prostate gland, 5 point 4 by 4.2 cm Other: No supplemental non-categorized findings. Musculoskeletal: Bridging spurring of the right sacroiliac joint. Superior endplate compression fracture at T9, new compared to 05/10/2014 and likely new compared to 05/06/15. Remote inferior endplate compression at L3. IMPRESSION: 1. Richter periumbilical hernia contains a small margin of the small bowel and some adjacent fluid. Inflammation or injury of the small bowel wall is not excluded but there is no bowel dilatation. 2. New superior endplate compression fracture of T9 3. Markedly severe emphysema at the lung bases. There is  some bandlike nodularity at the right lung base but other areas of nodularity of both lung bases have resolved compared to the prior CT chest from 2016. 4. Sigmoid colon diverticulosis. 5. Aortoiliac atherosclerotic vascular disease. Right coronary artery atherosclerotic calcification. Electronically Signed   By: Gaylyn Rong M.D.   On: 08/19/2015 18:07        Scheduled Meds: . antiseptic oral rinse  7 mL Mouth Rinse BID  . aspirin EC  81 mg Oral Daily  . atorvastatin  40 mg Oral q1800  . dextromethorphan-guaiFENesin  1 tablet Oral BID  . ferrous sulfate  325 mg Oral Q breakfast  . furosemide  40 mg Intravenous Q12H  . heparin subcutaneous  5,000 Units Subcutaneous Q8H  . hydrochlorothiazide  25 mg Oral Daily  .  ipratropium-albuterol  3 mL Nebulization Q6H  . lidocaine  1 patch Transdermal Q24H  . mometasone-formoterol  2 puff Inhalation BID  . montelukast  10 mg Oral QHS  . multivitamin with minerals  1 tablet Oral Daily  . pantoprazole  40 mg Oral BID  . polyethylene glycol  17 g Oral Daily  . potassium chloride  40 mEq Oral BID  . tiotropium  18 mcg Inhalation Daily   Continuous Infusions:       Time spent:    Zannie Cove, MD [facility] Pager [phone]  If 7PM-7AM, please contact night-coverage [email or web address] Password [facility] 08/21/2015, 11:35 AM

## 2015-08-22 DIAGNOSIS — I5032 Chronic diastolic (congestive) heart failure: Secondary | ICD-10-CM | POA: Diagnosis not present

## 2015-08-22 DIAGNOSIS — R7989 Other specified abnormal findings of blood chemistry: Secondary | ICD-10-CM | POA: Diagnosis not present

## 2015-08-22 DIAGNOSIS — R0789 Other chest pain: Secondary | ICD-10-CM | POA: Diagnosis not present

## 2015-08-22 MED ORDER — FUROSEMIDE 40 MG PO TABS
40.0000 mg | ORAL_TABLET | Freq: Every day | ORAL | Status: DC
  Administered 2015-08-22 – 2015-08-23 (×2): 40 mg via ORAL
  Filled 2015-08-22 (×2): qty 1

## 2015-08-22 NOTE — Progress Notes (Signed)
  Subjective: Patient is comfortable - had BM Awaiting discharge later today  Objective: Vital signs in last 24 hours: Temp:  [97 F (36.1 C)-98.6 F (37 C)] 98 F (36.7 C) (07/16 0647) Pulse Rate:  [92-116] 92 (07/16 0647) Resp:  [18-20] 18 (07/16 0647) BP: (110-142)/(67-86) 110/72 mmHg (07/16 0647) SpO2:  [94 %-99 %] 97 % (07/16 0647) Weight:  [78.79 kg (173 lb 11.2 oz)] 78.79 kg (173 lb 11.2 oz) (07/16 0647) Last BM Date: 08/20/15  Intake/Output from previous day: 07/15 0701 - 07/16 0700 In: 360 [P.O.:360] Out: 2425 [Urine:2425] Intake/Output this shift:    GI: soft, non-tender; non-palpable umbilical hernia; + BS  Lab Results:   Recent Labs  08/19/15 1355 08/21/15 0411  WBC 9.7 10.0  HGB 10.0* 10.4*  HCT 37.5* 36.0*  PLT 329 335   BMET  Recent Labs  08/19/15 1355 08/21/15 0411  NA 141 136  K 3.7 3.5  CL 100* 98*  CO2 33* 27  GLUCOSE 105* 99  BUN 12 10  CREATININE 0.99 0.78  CALCIUM 9.3 9.3   PT/INR  Recent Labs  08/19/15 2118  LABPROT 12.9  INR 0.95   ABG No results for input(s): PHART, HCO3 in the last 72 hours.  Invalid input(s): PCO2, PO2  Studies/Results: No results found.  Anti-infectives: Anti-infectives    None      Assessment/Plan: Incarcerated umbilical hernia - now reduced Normal bowel function  OK for discharge Pulmonary follow-up for medical optimization Elective hernia repair after pulmonary clearance.  Follow-up info in discharge section.      [name] [name] K. 08/22/2015

## 2015-08-22 NOTE — Progress Notes (Signed)
Orthopedic Tech Progress Note Patient Details:  [name] Morningddie Paye Jr. Mar 14, 1940 [id number]  Patient ID: [name] MorningEddie Fuller Jr., male   DOB: Mar 14, 1940, 75 y.o.   MRN: [id number]   Saul FordyceJennifer C Trenna Kiely 08/22/2015, 1:00 PMCalled [employer] for TLSO.

## 2015-08-22 NOTE — Progress Notes (Signed)
PROGRESS NOTE    [name] [name].  WUJ:[id number] DOB: Nov 03, 1940 DOA: 08/19/2015 PCP: [name], NP   Brief Narrative:  [name] Lemming. is a 75 y.o. male with medical history significant of hypertension, COPD on 2 L oxygen at home, GERD, remote DVT, migraine headache, chronic dCHF, who presents with abdominal pain, chest pressure and back pain. Patient reports that he has been having abdominal pain for more than 2 weeks. His abdominal pain is located in the upper abdomen, constant, 7 out of 10 in severity, nonradiating. He also reports chronic lower back pain, which has worsened in the past several days.  Patient states that he has intermittent chest pressure for a long time, which happens once or twice per month FOund to have Richters periumbilical hernia with small margin of bowel and T9 fracture  Assessment & Plan:  Abdominal pain due to umbilical hernia -now reduced -CCS following, advance diet slowly, symptoms improving -per [facility] FU for Surgery down the road -Supportive care,  -continue laxatives   Chest pressure  -resolved, Troponin 0.04, no evidence of ACS, EKG unchanged -2D ECHO with normal EF and wall motion -last stress 2years ago was normal, requested results from Dr.Ganji's office  T9 fracture -pain control, physical therapy eval completed -lidocaine patch added -TSLO brace ordered per pt request  Severe COPD -with rare wheezes, duonebs scheduled and PRN  Acute on Chronic diastolic CHF -s/p 2doses lasix , much improved and euvolemic now -change to Po lasix today -Normal EF on ECHo in 2010  GERD: -Protonix  DVT ppx: SQ Heparin  Code Status: Full code Family Communication: None at bed side. Disposition Plan: home tomorrow  Consultants:   CCS   Subjective: breathing better, back improving abd pain starting to improve  Objective: Filed Vitals:   08/22/15 0647 08/22/15 0946 08/22/15 0947 08/22/15 1152  BP: 110/72   131/79  Pulse: 92    97  Temp: 98 F (36.7 C)   97.7 F (36.5 C)  TempSrc: Oral   Oral  Resp: 18   18  Height:      Weight: 78.79 kg (173 lb 11.2 oz)     SpO2: 97% 94% 94% 94%    Intake/Output Summary (Last 24 hours) at 08/22/15 1246 Last data filed at 08/22/15 1005  Gross per 24 hour  Intake    460 ml  Output   1526 ml  Net  -1066 ml   Filed Weights   08/20/15 0512 08/21/15 0502 08/22/15 0647  Weight: 82.01 kg (180 lb 12.8 oz) 80.513 kg (177 lb 8 oz) 78.79 kg (173 lb 11.2 oz)    Examination:  General exam: Appears calm and comfortable  Respiratory system: no wheezes, no crackles today Cardiovascular system: S1 & S2 heard, RRR. No JVD, murmurs, rubs, gallops or clicks. No pedal edema. Gastrointestinal system: Abdomen is nondistended, soft and nontender. No organomegaly or masses felt. Normal bowel sounds heard. Central nervous system: Alert and oriented. No focal neurological deficits. Extremities: Symmetric 5 x 5 power. Skin: No rashes, lesions or ulcers Psychiatry: Judgement and insight appear normal. Mood & affect appropriate.     Data Reviewed: I have personally reviewed following labs and imaging studies  CBC:  Recent Labs Lab 08/19/15 1355 08/21/15 0411  WBC 9.7 10.0  HGB 10.0* 10.4*  HCT 37.5* 36.0*  MCV 93.1 87.8  PLT 329 335   Basic Metabolic Panel:  Recent Labs Lab 08/19/15 1355 08/21/15 0411  NA 141 136  K 3.7 3.5  CL 100* 98*  CO2 33* 27  GLUCOSE 105* 99  BUN 12 10  CREATININE 0.99 0.78  CALCIUM 9.3 9.3   GFR: Estimated Creatinine Clearance: 75.7 mL/min (by C-G formula based on Cr of 0.78). Liver Function Tests:  Recent Labs Lab 08/19/15 1355  AST 21  ALT 13*  ALKPHOS 63  BILITOT 0.2*  PROT 6.1*  ALBUMIN 3.4*    Recent Labs Lab 08/19/15 1355  LIPASE 24   No results for input(s): AMMONIA in the last 168 hours. Coagulation Profile:  Recent Labs Lab 08/19/15 2118  INR 0.95   Cardiac Enzymes:  Recent Labs Lab 08/19/15 1708  08/19/15 1948 08/19/15 2118 08/20/15 0246 08/20/15 0823  TROPONINI 0.04* 0.04* 0.04* 0.04* 0.03*   BNP (last 3 results) No results for input(s): PROBNP in the last 8760 hours. HbA1C:  Recent Labs  08/20/15 0246  HGBA1C 5.9*   CBG:  Recent Labs Lab 08/19/15 2311  GLUCAP 93   Lipid Profile:  Recent Labs  08/20/15 0246  CHOL 189  HDL 66  LDLCALC 70  TRIG 263*  CHOLHDL 2.9   Thyroid Function Tests: No results for input(s): TSH, T4TOTAL, FREET4, T3FREE, THYROIDAB in the last 72 hours. Anemia Panel: No results for input(s): VITAMINB12, FOLATE, FERRITIN, TIBC, IRON, RETICCTPCT in the last 72 hours. Urine analysis:    Component Value Date/Time   COLORURINE YELLOW 08/19/2015 1716   APPEARANCEUR CLEAR 08/19/2015 1716   LABSPEC 1.025 08/19/2015 1716   PHURINE 8.0 08/19/2015 1716   GLUCOSEU NEGATIVE 08/19/2015 1716   HGBUR NEGATIVE 08/19/2015 1716   BILIRUBINUR NEGATIVE 08/19/2015 1716   KETONESUR NEGATIVE 08/19/2015 1716   PROTEINUR NEGATIVE 08/19/2015 1716   UROBILINOGEN 0.2 10/29/2013 2011   NITRITE NEGATIVE 08/19/2015 1716   LEUKOCYTESUR NEGATIVE 08/19/2015 1716   Sepsis Labs: @LABRCNTIP (procalcitonin:4,lacticidven:4)  )No results found for this or any previous visit (from the past 240 hour(s)).       Radiology Studies: No results found.      Scheduled Meds: . antiseptic oral rinse  7 mL Mouth Rinse BID  . aspirin EC  81 mg Oral Daily  . atorvastatin  40 mg Oral q1800  . dextromethorphan-guaiFENesin  1 tablet Oral BID  . ferrous sulfate  325 mg Oral Q breakfast  . heparin subcutaneous  5,000 Units Subcutaneous Q8H  . hydrochlorothiazide  25 mg Oral Daily  . ipratropium-albuterol  3 mL Nebulization TID  . lidocaine  1 patch Transdermal Q24H  . mometasone-formoterol  2 puff Inhalation BID  . montelukast  10 mg Oral QHS  . multivitamin with minerals  1 tablet Oral Daily  . pantoprazole  40 mg Oral BID  . polyethylene glycol  17 g Oral Daily  .  potassium chloride  40 mEq Oral BID  . tiotropium  18 mcg Inhalation Daily   Continuous Infusions:       Time spent: 35min    Zannie CovePreetha Lillyen Schow, MD [facility] Pager [phone]  If 7PM-7AM, please contact night-coverage [email or web address] Password [email or web address] 08/22/2015, 12:46 PM

## 2015-08-23 DIAGNOSIS — R0789 Other chest pain: Secondary | ICD-10-CM | POA: Diagnosis not present

## 2015-08-23 DIAGNOSIS — I5032 Chronic diastolic (congestive) heart failure: Secondary | ICD-10-CM | POA: Diagnosis not present

## 2015-08-23 DIAGNOSIS — R7989 Other specified abnormal findings of blood chemistry: Secondary | ICD-10-CM | POA: Diagnosis not present

## 2015-08-23 LAB — BASIC METABOLIC PANEL
ANION GAP: 8 (ref 5–15)
BUN: 15 mg/dL (ref 6–20)
CALCIUM: 9.4 mg/dL (ref 8.9–10.3)
CO2: 30 mmol/L (ref 22–32)
Chloride: 97 mmol/L — ABNORMAL LOW (ref 101–111)
Creatinine, Ser: 0.99 mg/dL (ref 0.61–1.24)
GLUCOSE: 108 mg/dL — AB (ref 65–99)
Potassium: 4.3 mmol/L (ref 3.5–5.1)
SODIUM: 135 mmol/L (ref 135–145)

## 2015-08-23 LAB — CBC
HCT: 38.2 % — ABNORMAL LOW (ref 39.0–52.0)
Hemoglobin: 10.8 g/dL — ABNORMAL LOW (ref 13.0–17.0)
MCH: 24.8 pg — ABNORMAL LOW (ref 26.0–34.0)
MCHC: 28.3 g/dL — ABNORMAL LOW (ref 30.0–36.0)
MCV: 87.8 fL (ref 78.0–100.0)
PLATELETS: 332 10*3/uL (ref 150–400)
RBC: 4.35 MIL/uL (ref 4.22–5.81)
RDW: 15.9 % — AB (ref 11.5–15.5)
WBC: 10.1 10*3/uL (ref 4.0–10.5)

## 2015-08-23 MED ORDER — FUROSEMIDE 40 MG PO TABS: 40.0000 mg | ORAL_TABLET | Freq: Every day | ORAL | Status: DC

## 2015-08-23 MED ORDER — POLYETHYLENE GLYCOL 3350 17 G PO PACK: 17.0000 g | PACK | Freq: Every day | ORAL | Status: AC | PRN

## 2015-08-23 MED ORDER — POTASSIUM CHLORIDE CRYS ER 20 MEQ PO TBCR: 40.0000 meq | EXTENDED_RELEASE_TABLET | Freq: Every day | ORAL | Status: DC

## 2015-08-23 MED ORDER — OXYCODONE-ACETAMINOPHEN 5-325 MG PO TABS: 1.0000 | ORAL_TABLET | Freq: Four times a day (QID) | ORAL | Status: DC | PRN
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
## 2015-08-24 NOTE — Discharge Summary (Signed)
Physician Discharge Summary  [name] [name]. WJX:[id number] DOB: Apr 14, 1940 DOA: 08/19/2015  PCP: [name], NP  Admit date: 08/19/2015 Discharge date: 08/23/2015  Time spent: 45 minutes  Recommendations for Outpatient Follow-up:  1. PCP Dr.Kimberly Millsaps on 7/21 2. 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 is a 75 y.o. male with medical history significant of hypertension, COPD on 2 L oxygen at home, GERD, remote DVT, migraine headache, chronic dCHF, who presented with abdominal pain, chest pressure and back pain. Patient reported  abdominal pain for more than 2 weeks, located in the upper abdomen, constant, 7 out of 10 in severity, nonradiating. He also reported chronic lower back pain, which has worsened in the past several days. Currently the pain is located midline of lower back, constant, 7 out of 10 in severity.  In ED found to have CT abdomen/pelvis showed: 1. Richter periumbilical hernia contains a small margin of the small bowel and some adjacent fluid. Inflammation or injury of the small bowel wall is not excluded but there is no bowel dilatation. 2. New superior endplate compression fracture of T9. 3. Markedly severe emphysema at the lung bases  Hospital Course:  Abdominal pain due to umbilical hernia -the hernia was  reduced -[facility] consulted, advance diet slowly, symptoms improved -per [facility] FU with Dr.Blackman or Janee Morn for Surgery down the road -continue laxatives   Chest pressure  -resolved, Troponin 0.04, no evidence of ACS, EKG unchanged -2D ECHO with normal EF and wall motion -last stress 2years ago was normal per Dr.Ganji  T9 fracture -pain controlled -lidocaine patch added -TSLO brace ordered  -Pt eval completed, No further PT recommended, i recommended Home health Eval but pt declined this  Severe COPD/Chronic respiratory failure -stable on 2-3L Home O2 -continue nebs PRN  Acute on Chronic diastolic CHF -s/p 2doses lasix , much improved and euvolemic now -changed to Po lasix yesterday -Normal EF on ECHo in 2010  GERD: -Protonix  Consultations:  CCS  Discharge Exam: Filed Vitals:   08/22/15 1941 08/23/15 0429  BP: 114/67 112/82  Pulse: 94 91  Temp: 98.8 F (37.1 C) 98.6 F (37 C)  Resp: 18 18    General: AAOx3 Cardiovascular:S1S2/RRR Respiratory:CTAB  Discharge Instructions   Discharge Instructions    Diet - low sodium heart healthy    Complete by:  As directed      Increase activity slowly    Complete by:  As directed           Discharge Medication List as of 08/23/2015 11:23 AM    START taking these medications   Details  oxyCODONE-acetaminophen (PERCOCET/ROXICET) 5-325 MG tablet Take 1 tablet by mouth every 6 (six) hours as needed for moderate pain or severe pain., Starting 08/23/2015, Until Discontinued, Print    polyethylene glycol (MIRALAX /  GLYCOLAX) packet Take 17 g by mouth daily as needed for mild constipation., Starting 08/23/2015, Until Discontinued, Print    potassium chloride SA (K-DUR,KLOR-CON) 20 MEQ tablet Take 2 tablets (40 mEq total) by mouth daily., Starting 08/23/2015, Until Discontinued, Print      CONTINUE these medications which have CHANGED   Details  furosemide (LASIX) 40 MG tablet Take 1 tablet (40 mg total) by mouth daily.,  Starting 08/23/2015, Until Discontinued, No Print      CONTINUE these medications which have NOT CHANGED   Details  albuterol (PROVENTIL HFA;VENTOLIN HFA) 108 (90 BASE) MCG/ACT inhaler Inhale 2 puffs into the lungs every 6 (six) hours as needed for wheezing or shortness of breath., Starting 04/13/2014, Until Discontinued, Normal    ALPRAZolam (XANAX) 0.5 MG tablet Take 0.5 mg by mouth 4 (four) times daily as needed for anxiety. , Starting 04/06/2014, Until Discontinued, Historical Med    Calcium Carbonate Antacid (ANTACID PO) Take 1 tablet by mouth every 4 (four) hours as needed (for acid reflux or heartburn). , Until Discontinued, Historical Med    dextromethorphan-guaiFENesin (MUCINEX DM) 30-600 MG 12hr tablet Take 1 tablet by mouth 2 (two) times daily., Starting 05/10/2015, Until Discontinued, Normal    hydrochlorothiazide (HYDRODIURIL) 25 MG tablet Take 25 mg by mouth daily., Starting 07/06/2015, Until Discontinued, Historical Med    ipratropium (ATROVENT) 0.02 % nebulizer solution Inhale 0.5 mg into the lungs every 8 (eight) hours as needed for wheezing or shortness of breath. , Starting 07/28/2015, Until Discontinued, Historical Med    ipratropium-albuterol (DUONEB) 0.5-2.5 (3) MG/3ML SOLN Take 3 mLs by nebulization every 6 (six) hours as needed., Starting 05/12/2014, Until Discontinued, Normal    IRON CR PO Take 100 mg by mouth daily., Until Discontinued, Historical Med    mometasone-formoterol (DULERA) 200-5 MCG/ACT AERO Inhale 2 puffs into the lungs 2 (two) times daily., Until Discontinued, Historical Med    Multiple Vitamin (MULTIVITAMIN) tablet Take 1 tablet by mouth daily., Until Discontinued, Historical Med    ondansetron (ZOFRAN) 8 MG tablet Take 8 mg by mouth every 8 (eight) hours as needed for nausea or vomiting. , Starting 08/19/2015, Until Discontinued, Historical Med    pantoprazole (PROTONIX) 40 MG tablet Take 1 tablet (40 mg total) by mouth 2 (two) times daily., Starting  11/01/2013, Until Discontinued, Print    sodium chloride (OCEAN) 0.65 % SOLN nasal spray Place 1 spray into the nose as needed for congestion (Dry nasal passages)., Until Discontinued, Historical Med      STOP taking these medications     ferrous sulfate 325 (65 FE) MG tablet      montelukast (SINGULAIR) 10 MG tablet      SPIRIVA HANDIHALER 18 MCG inhalation capsule        Allergies  Allergen Reactions  . Advair Diskus [Fluticasone-Salmeterol] Other (See Comments)    Blurred vision  . Brovana [Arformoterol]     Ineffective--exacerbated coughing/congestion  . Codeine Other (See Comments)    Hallucinations, blurred vision  . Ferrous Sulfate Other (See Comments)    Stomach pain  . Other Swelling    Bicillin injection in 1961--swelling in lower extremities and joints, and whelps up arm.  Has taken penicillin since w/o reaction.  . Prednisone Other (See Comments)    Stomach pain   Follow-up Information    Follow up with [facility].   Specialty:  General Surgery   Why:  Either Dr. Carman Ching or Dr. Violeta Gelinas regarding umbilical hernia repair in 2 weeks  Contact information:   [address] [place] [address] [phone]       Follow up with Millsaps, Joelene Millin, NP. Go on 08/27/2015.   Why:  @ 11:00am   Contact information:   [facility] [address] [place] [address] [phone]        The results of significant diagnostics from this hospitalization (including imaging, microbiology, ancillary and laboratory) are listed below for reference.    Significant Diagnostic Studies: Ct Abdomen Pelvis W Contrast  08/19/2015  CLINICAL DATA:  Abdominal and back pain for 2 weeks. Bloating of the abdomen and distention. EXAM: CT ABDOMEN AND PELVIS WITH CONTRAST TECHNIQUE: Multidetector CT imaging of the abdomen and pelvis was performed using the standard protocol following bolus administration of intravenous contrast.  CONTRAST:  ISOVUE-300 IOPAMIDOL (ISOVUE-300) INJECTION 61% COMPARISON:  Abdominal radiograph 05/06/2015 FINDINGS: Lower chest: Markedly severe emphysema at the lung bases. Bandlike nodularity in the right lower lobe and right middle lobe, some of the prior scattered nodularity in the lower lobes has resolved in the antrum. Right coronary artery atherosclerotic calcification. Hepatobiliary: Unremarkable Pancreas: Unremarkable Spleen: Unremarkable Adrenals/Urinary Tract: Adrenal glands normal. 9 mm cyst of the right mid to lower kidney, image 38/201. 1.7 by 1.6 cm simple appearing cyst of the left mid kidney, image 35/201. No stones or hydronephrosis. Urinary bladder unremarkable. Stomach/Bowel: Sigmoid colon diverticulosis, no active diverticulitis. Umbilical hernia contains fluid in a small margin of small bowel on image 87/204 Hal Hope type hernia). No small bowel dilatation. Vascular/Lymphatic: Aortoiliac atherosclerotic vascular disease. No pathologic adenopathy. Reproductive: Mildly prominent prostate gland, 5 point 4 by 4.2 cm Other: No supplemental non-categorized findings. Musculoskeletal: Bridging spurring of the right sacroiliac joint. Superior endplate compression fracture at T9, new compared to 05/10/2014 and likely new compared to 05/06/15. Remote inferior endplate compression at L3. IMPRESSION: 1. Richter periumbilical hernia contains a small margin of the small bowel and some adjacent fluid. Inflammation or injury of the small bowel wall is not excluded but there is no bowel dilatation. 2. New superior endplate compression fracture of T9 3. Markedly severe emphysema at the lung bases. There is some bandlike nodularity at the right lung base but other areas of nodularity of both lung bases have resolved compared to the prior CT chest from 2016. 4. Sigmoid colon diverticulosis. 5. Aortoiliac atherosclerotic vascular disease. Right coronary artery atherosclerotic calcification. Electronically Signed    By: Gaylyn Rong M.D.   On: 08/19/2015 18:07    Microbiology: No results found for this or any previous visit (from the past 240 hour(s)).   Labs: Basic Metabolic Panel:  Recent Labs Lab 08/19/15 1355 08/21/15 0411 08/23/15 0249  NA 141 136 135  K 3.7 3.5 4.3  CL 100* 98* 97*  CO2 33* 27 30  GLUCOSE 105* 99 108*  BUN CREATININE 0.99 0.78 0.99  CALCIUM 9.3 9.3 9.4   Liver Function Tests:  Recent Labs Lab 08/19/15 1355  AST 21  ALT 13*  ALKPHOS 63  BILITOT 0.2*  PROT 6.1*  ALBUMIN 3.4*    Recent Labs Lab 08/19/15 1355  LIPASE 24   No results for input(s): AMMONIA in the last 168 hours. CBC:  Recent Labs Lab 08/19/15 1355 08/21/15 0411 08/23/15 0249  WBC 9.7 10.0 10.1  HGB 10.0* 10.4* 10.8*  HCT 37.5* 36.0* 38.2*  MCV 93.1 87.8 87.8  PLT 329 335 332   Cardiac Enzymes:  Recent Labs Lab 08/19/15 1708 08/19/15  1948 08/19/15 2118 08/20/15 0246 08/20/15 0823  TROPONINI 0.04* 0.04* 0.04* 0.04* 0.03*   BNP: BNP (last 3 results)  Recent Labs  05/06/15 0731 08/19/15 1708  BNP 57.7 98.3    ProBNP (last 3 results) No results for input(s): PROBNP in the last 8760 hours.  CBG:  Recent Labs Lab 08/19/15 2311  GLUCAP 93       Signed:  Adellyn Capek MD.  [facility] 08/24/2015, 5:00 PM

## 2015-09-14 ENCOUNTER — Other Ambulatory Visit: Payer: Self-pay | Admitting: Surgery

## 2015-10-25 ENCOUNTER — Emergency Department (HOSPITAL_COMMUNITY): Payer: Medicare Other

## 2015-10-25 ENCOUNTER — Encounter (HOSPITAL_COMMUNITY): Payer: Self-pay

## 2015-10-25 ENCOUNTER — Observation Stay (HOSPITAL_COMMUNITY)
Admission: EM | Admit: 2015-10-25 | Discharge: 2015-10-27 | Disposition: A | Discharge status: Home / Self Care | Payer: Medicare Other | Attending: Family Medicine | Admitting: Family Medicine

## 2015-10-25 DIAGNOSIS — I4510 Unspecified right bundle-branch block: Secondary | ICD-10-CM | POA: Insufficient documentation

## 2015-10-25 DIAGNOSIS — M6281 Muscle weakness (generalized): Secondary | ICD-10-CM

## 2015-10-25 DIAGNOSIS — E876 Hypokalemia: Secondary | ICD-10-CM | POA: Diagnosis present

## 2015-10-25 DIAGNOSIS — R140 Abdominal distension (gaseous): Secondary | ICD-10-CM | POA: Diagnosis not present

## 2015-10-25 DIAGNOSIS — Z888 Allergy status to other drugs, medicaments and biological substances status: Secondary | ICD-10-CM | POA: Insufficient documentation

## 2015-10-25 DIAGNOSIS — I493 Ventricular premature depolarization: Secondary | ICD-10-CM | POA: Insufficient documentation

## 2015-10-25 DIAGNOSIS — J441 Chronic obstructive pulmonary disease with (acute) exacerbation: Principal | ICD-10-CM | POA: Insufficient documentation

## 2015-10-25 DIAGNOSIS — M4854XA Collapsed vertebra, not elsewhere classified, thoracic region, initial encounter for fracture: Secondary | ICD-10-CM | POA: Insufficient documentation

## 2015-10-25 DIAGNOSIS — Z961 Presence of intraocular lens: Secondary | ICD-10-CM | POA: Diagnosis not present

## 2015-10-25 DIAGNOSIS — W19XXXA Unspecified fall, initial encounter: Secondary | ICD-10-CM

## 2015-10-25 DIAGNOSIS — D509 Iron deficiency anemia, unspecified: Secondary | ICD-10-CM | POA: Insufficient documentation

## 2015-10-25 DIAGNOSIS — I110 Hypertensive heart disease with heart failure: Secondary | ICD-10-CM | POA: Diagnosis not present

## 2015-10-25 DIAGNOSIS — K219 Gastro-esophageal reflux disease without esophagitis: Secondary | ICD-10-CM | POA: Insufficient documentation

## 2015-10-25 DIAGNOSIS — I491 Atrial premature depolarization: Secondary | ICD-10-CM | POA: Diagnosis not present

## 2015-10-25 DIAGNOSIS — N179 Acute kidney failure, unspecified: Secondary | ICD-10-CM | POA: Diagnosis not present

## 2015-10-25 DIAGNOSIS — Z7951 Long term (current) use of inhaled steroids: Secondary | ICD-10-CM | POA: Insufficient documentation

## 2015-10-25 DIAGNOSIS — Z9981 Dependence on supplemental oxygen: Secondary | ICD-10-CM | POA: Insufficient documentation

## 2015-10-25 DIAGNOSIS — Z9181 History of falling: Secondary | ICD-10-CM | POA: Insufficient documentation

## 2015-10-25 DIAGNOSIS — J9621 Acute and chronic respiratory failure with hypoxia: Secondary | ICD-10-CM | POA: Diagnosis not present

## 2015-10-25 DIAGNOSIS — Z9841 Cataract extraction status, right eye: Secondary | ICD-10-CM | POA: Diagnosis not present

## 2015-10-25 DIAGNOSIS — I5032 Chronic diastolic (congestive) heart failure: Secondary | ICD-10-CM | POA: Insufficient documentation

## 2015-10-25 DIAGNOSIS — Z885 Allergy status to narcotic agent status: Secondary | ICD-10-CM | POA: Insufficient documentation

## 2015-10-25 DIAGNOSIS — E8770 Fluid overload, unspecified: Secondary | ICD-10-CM | POA: Diagnosis not present

## 2015-10-25 DIAGNOSIS — Z86718 Personal history of other venous thrombosis and embolism: Secondary | ICD-10-CM | POA: Diagnosis not present

## 2015-10-25 DIAGNOSIS — E538 Deficiency of other specified B group vitamins: Secondary | ICD-10-CM | POA: Diagnosis not present

## 2015-10-25 DIAGNOSIS — R52 Pain, unspecified: Secondary | ICD-10-CM

## 2015-10-25 DIAGNOSIS — Z818 Family history of other mental and behavioral disorders: Secondary | ICD-10-CM | POA: Insufficient documentation

## 2015-10-25 DIAGNOSIS — R0602 Shortness of breath: Secondary | ICD-10-CM | POA: Diagnosis present

## 2015-10-25 DIAGNOSIS — Z87891 Personal history of nicotine dependence: Secondary | ICD-10-CM | POA: Diagnosis not present

## 2015-10-25 DIAGNOSIS — Z79899 Other long term (current) drug therapy: Secondary | ICD-10-CM | POA: Insufficient documentation

## 2015-10-25 DIAGNOSIS — Z79891 Long term (current) use of opiate analgesic: Secondary | ICD-10-CM | POA: Insufficient documentation

## 2015-10-25 DIAGNOSIS — F419 Anxiety disorder, unspecified: Secondary | ICD-10-CM | POA: Diagnosis not present

## 2015-10-25 DIAGNOSIS — Z7982 Long term (current) use of aspirin: Secondary | ICD-10-CM | POA: Insufficient documentation

## 2015-10-25 DIAGNOSIS — I2510 Atherosclerotic heart disease of native coronary artery without angina pectoris: Secondary | ICD-10-CM | POA: Insufficient documentation

## 2015-10-25 DIAGNOSIS — M8580 Other specified disorders of bone density and structure, unspecified site: Secondary | ICD-10-CM | POA: Diagnosis not present

## 2015-10-25 DIAGNOSIS — R531 Weakness: Secondary | ICD-10-CM | POA: Insufficient documentation

## 2015-10-25 DIAGNOSIS — Z880 Allergy status to penicillin: Secondary | ICD-10-CM | POA: Insufficient documentation

## 2015-10-25 DIAGNOSIS — Z803 Family history of malignant neoplasm of breast: Secondary | ICD-10-CM | POA: Insufficient documentation

## 2015-10-25 LAB — URINALYSIS, ROUTINE W REFLEX MICROSCOPIC
BILIRUBIN URINE: NEGATIVE
Glucose, UA: NEGATIVE mg/dL
HGB URINE DIPSTICK: NEGATIVE
KETONES UR: NEGATIVE mg/dL
Leukocytes, UA: NEGATIVE
Nitrite: NEGATIVE
PROTEIN: NEGATIVE mg/dL
Specific Gravity, Urine: 1.013 (ref 1.005–1.030)
pH: 7.5 (ref 5.0–8.0)

## 2015-10-25 LAB — BASIC METABOLIC PANEL
Anion gap: 12 (ref 5–15)
Anion gap: 9 (ref 5–15)
BUN: 16 mg/dL (ref 6–20)
BUN: 14 mg/dL (ref 6–20)
CALCIUM: 8.3 mg/dL — AB (ref 8.9–10.3)
CHLORIDE: 89 mmol/L — AB (ref 101–111)
CO2: 36 mmol/L — AB (ref 22–32)
CO2: 34 mmol/L — AB (ref 22–32)
CREATININE: 1.33 mg/dL — AB (ref 0.61–1.24)
Calcium: 8.9 mg/dL (ref 8.9–10.3)
Chloride: 92 mmol/L — ABNORMAL LOW (ref 101–111)
Creatinine, Ser: 1.22 mg/dL (ref 0.61–1.24)
GFR calc Af Amer: 59 mL/min — ABNORMAL LOW (ref >60)
GFR calc Af Amer: >60 mL/min (ref >60)
GFR calc non Af Amer: 51 mL/min — ABNORMAL LOW (ref >60)
GFR, EST NON AFRICAN AMERICAN: 57 mL/min — AB (ref >60)
GLUCOSE: 196 mg/dL — AB (ref 65–99)
Glucose, Bld: 95 mg/dL (ref 65–99)
Potassium: 2.7 mmol/L — CL (ref 3.5–5.1)
Potassium: 3.7 mmol/L (ref 3.5–5.1)
SODIUM: 137 mmol/L (ref 135–145)
Sodium: 135 mmol/L (ref 135–145)

## 2015-10-25 LAB — CBC
HCT: 35.6 % — ABNORMAL LOW (ref 39.0–52.0)
HEMATOCRIT: 33.5 % — AB (ref 39.0–52.0)
Hemoglobin: 10.2 g/dL — ABNORMAL LOW (ref 13.0–17.0)
Hemoglobin: 9.3 g/dL — ABNORMAL LOW (ref 13.0–17.0)
MCH: 25.7 pg — AB (ref 26.0–34.0)
MCH: 25 pg — AB (ref 26.0–34.0)
MCHC: 28.7 g/dL — AB (ref 30.0–36.0)
MCHC: 27.8 g/dL — AB (ref 30.0–36.0)
MCV: 89.7 fL (ref 78.0–100.0)
MCV: 90.1 fL (ref 78.0–100.0)
PLATELETS: 282 10*3/uL (ref 150–400)
PLATELETS: 256 10*3/uL (ref 150–400)
RBC: 3.97 MIL/uL — ABNORMAL LOW (ref 4.22–5.81)
RBC: 3.72 MIL/uL — ABNORMAL LOW (ref 4.22–5.81)
RDW: 17.6 % — AB (ref 11.5–15.5)
RDW: 17.6 % — AB (ref 11.5–15.5)
WBC: 9.2 10*3/uL (ref 4.0–10.5)
WBC: 5.9 10*3/uL (ref 4.0–10.5)

## 2015-10-25 LAB — CBG MONITORING, ED: Glucose-Capillary: 98 mg/dL (ref 65–99)

## 2015-10-25 LAB — TROPONIN I
Troponin I: 0.04 ng/mL (ref <0.03)
Troponin I: 0.03 ng/mL (ref <0.03)

## 2015-10-25 LAB — BRAIN NATRIURETIC PEPTIDE: B Natriuretic Peptide: 111.1 pg/mL — ABNORMAL HIGH (ref 0.0–100.0)

## 2015-10-25 MED ORDER — SODIUM CHLORIDE 0.9% FLUSH
3.0000 mL | Freq: Two times a day (BID) | INTRAVENOUS | Status: DC
  Administered 2015-10-25 – 2015-10-27 (×4): 3 mL via INTRAVENOUS

## 2015-10-25 MED ORDER — ALBUTEROL SULFATE (2.5 MG/3ML) 0.083% IN NEBU
2.5000 mg | INHALATION_SOLUTION | RESPIRATORY_TRACT | Status: DC | PRN
  Administered 2015-10-26: 2.5 mg via RESPIRATORY_TRACT
  Filled 2015-10-25: qty 3

## 2015-10-25 MED ORDER — POTASSIUM CHLORIDE CRYS ER 20 MEQ PO TBCR
40.0000 meq | EXTENDED_RELEASE_TABLET | Freq: Once | ORAL | Status: AC
  Administered 2015-10-25: 40 meq via ORAL
  Filled 2015-10-25: qty 2

## 2015-10-25 MED ORDER — ONDANSETRON HCL 4 MG/2ML IJ SOLN: 4.0000 mg | Freq: Four times a day (QID) | INTRAMUSCULAR | Status: DC | PRN

## 2015-10-25 MED ORDER — IPRATROPIUM-ALBUTEROL 0.5-2.5 (3) MG/3ML IN SOLN
3.0000 mL | RESPIRATORY_TRACT | Status: AC
  Administered 2015-10-25 (×3): 3 mL via RESPIRATORY_TRACT
  Filled 2015-10-25: qty 3

## 2015-10-25 MED ORDER — METHYLPREDNISOLONE SODIUM SUCC 125 MG IJ SOLR
125.0000 mg | Freq: Once | INTRAMUSCULAR | Status: AC
  Administered 2015-10-25: 125 mg via INTRAVENOUS
  Filled 2015-10-25: qty 2

## 2015-10-25 MED ORDER — ONDANSETRON HCL 4 MG PO TABS
4.0000 mg | ORAL_TABLET | Freq: Four times a day (QID) | ORAL | Status: DC | PRN
  Administered 2015-10-27 (×2): 4 mg via ORAL
  Filled 2015-10-25 (×2): qty 1

## 2015-10-25 MED ORDER — FENTANYL CITRATE (PF) 100 MCG/2ML IJ SOLN
50.0000 ug | Freq: Once | INTRAMUSCULAR | Status: AC
  Administered 2015-10-25: 50 ug via INTRAVENOUS
  Filled 2015-10-25: qty 2

## 2015-10-25 MED ORDER — PREDNISONE 20 MG PO TABS
40.0000 mg | ORAL_TABLET | Freq: Every day | ORAL | Status: DC
  Administered 2015-10-26 – 2015-10-27 (×2): 40 mg via ORAL
  Filled 2015-10-25 (×2): qty 2

## 2015-10-25 MED ORDER — ENOXAPARIN SODIUM 40 MG/0.4ML ~~LOC~~ SOLN
40.0000 mg | SUBCUTANEOUS | Status: DC
  Administered 2015-10-25 – 2015-10-26 (×2): 40 mg via SUBCUTANEOUS
  Filled 2015-10-25 (×2): qty 0.4

## 2015-10-25 MED ORDER — MAGNESIUM SULFATE 2 GM/50ML IV SOLN
2.0000 g | Freq: Once | INTRAVENOUS | Status: AC
  Administered 2015-10-25: 2 g via INTRAVENOUS
  Filled 2015-10-25: qty 50

## 2015-10-25 MED ORDER — IPRATROPIUM-ALBUTEROL 0.5-2.5 (3) MG/3ML IN SOLN
3.0000 mL | RESPIRATORY_TRACT | Status: DC
  Administered 2015-10-25: 3 mL via RESPIRATORY_TRACT
  Filled 2015-10-25: qty 3

## 2015-10-25 MED ORDER — POLYETHYLENE GLYCOL 3350 17 G PO PACK: 17.0000 g | PACK | Freq: Every day | ORAL | Status: DC | PRN

## 2015-10-25 MED ORDER — IPRATROPIUM-ALBUTEROL 0.5-2.5 (3) MG/3ML IN SOLN
3.0000 mL | Freq: Once | RESPIRATORY_TRACT | Status: AC
  Administered 2015-10-25: 3 mL via RESPIRATORY_TRACT
  Filled 2015-10-25: qty 3

## 2015-10-25 MED ORDER — ALUM & MAG HYDROXIDE-SIMETH 200-200-20 MG/5ML PO SUSP
30.0000 mL | ORAL | Status: DC | PRN
  Administered 2015-10-25: 30 mL via ORAL
  Filled 2015-10-25 (×3): qty 30

## 2015-10-25 MED ORDER — ALPRAZOLAM 0.25 MG PO TABS
0.2500 mg | ORAL_TABLET | Freq: Two times a day (BID) | ORAL | Status: DC | PRN
  Administered 2015-10-26 – 2015-10-27 (×3): 0.25 mg via ORAL
  Filled 2015-10-25 (×3): qty 1

## 2015-10-25 MED ORDER — FUROSEMIDE 10 MG/ML IJ SOLN
40.0000 mg | Freq: Once | INTRAMUSCULAR | Status: AC
  Administered 2015-10-25: 40 mg via INTRAVENOUS
  Filled 2015-10-25: qty 4

## 2015-10-25 MED ORDER — LEVOFLOXACIN IN D5W 750 MG/150ML IV SOLN
750.0000 mg | Freq: Once | INTRAVENOUS | Status: AC
  Administered 2015-10-25: 750 mg via INTRAVENOUS
  Filled 2015-10-25: qty 150

## 2015-10-25 MED ORDER — ASPIRIN EC 81 MG PO TBEC
81.0000 mg | DELAYED_RELEASE_TABLET | Freq: Every day | ORAL | Status: DC
  Administered 2015-10-25 – 2015-10-27 (×3): 81 mg via ORAL
  Filled 2015-10-25 (×3): qty 1

## 2015-10-25 MED ORDER — LEVOFLOXACIN 750 MG PO TABS
750.0000 mg | ORAL_TABLET | Freq: Every day | ORAL | Status: DC
  Administered 2015-10-26 – 2015-10-27 (×2): 750 mg via ORAL
  Filled 2015-10-25 (×2): qty 1

## 2015-10-25 MED ORDER — IPRATROPIUM-ALBUTEROL 0.5-2.5 (3) MG/3ML IN SOLN
3.0000 mL | Freq: Three times a day (TID) | RESPIRATORY_TRACT | Status: DC
Start: 2015-10-26 — End: 2015-10-27
  Administered 2015-10-26 – 2015-10-27 (×5): 3 mL via RESPIRATORY_TRACT
  Filled 2015-10-25 (×5): qty 3

## 2015-10-25 MED ORDER — ACETAMINOPHEN 325 MG PO TABS
650.0000 mg | ORAL_TABLET | Freq: Four times a day (QID) | ORAL | Status: DC | PRN
  Administered 2015-10-26 (×3): 650 mg via ORAL
  Filled 2015-10-25 (×3): qty 2

## 2015-10-25 MED ORDER — POTASSIUM CHLORIDE 10 MEQ/100ML IV SOLN
10.0000 meq | INTRAVENOUS | Status: DC
  Administered 2015-10-25 (×2): 10 meq via INTRAVENOUS
  Filled 2015-10-25 (×2): qty 100

## 2015-10-25 MED ORDER — ACETAMINOPHEN 650 MG RE SUPP: 650.0000 mg | Freq: Four times a day (QID) | RECTAL | Status: DC | PRN

## 2015-10-25 NOTE — H&P (Signed)
Family Medicine [facility] Admission History and Physical Service Pager: [phone]  Patient name: [name] Morningddie Soltys Jr. Medical record number: [id number] Date of birth: May 01, 1940 Age: 75 y.o. Gender: male  Primary Care Provider: Egbert GaribaldiMillsaps, KIMBERLY M, NP Consultants: None Code Status: FULL  Chief Complaint: Shortness of Breath  Assessment and Plan: [name] Morningddie Coppinger Jr. is a 75 y.o. male presenting with generalized weakness and shortness of breath of 1 day duration . PMH is significant for COPD (on 2-3L O2 at home), HTN, HFpEF, hx DVT.  1. Shortness of breath: Likely COPD exacerbation with component of CHF exacerbation. Has had acute shortness of breath with generalized weakness of 1 day duration. Patient states h/o productive cough for the past week. Ongoing issue with gradual worsening of symptoms over the years. Was seeing Dr. Delford FieldWright 1.5 years ago but he retired with no visit on record since. Patient states he was intubated "many years ago" in the ICU for an exacerbation but cannot remember when and there are none on file. H/o DVT in 1990 but not currently on prophylaxis. Given x4 duoneb treatments, x1 dose of Levaquin, and x1 mag sulf in ED. Sat well on 3 L [place] in ED (home O2 dose). Patient was not able to get out of bed during examination due to weakness. CHF is a consideration, as he has significant pitting edema almost to the knee bilaterally. He also has crackles in his lung bases. BNP mildly elevated at 111, increased from 30, 58, and 98 at previous hospitalizations. ACS is also on the differential, given his constant, central chest pressure; however, EKG does not show any signs of ischemia/infarct and I-stat troponin was 0.04. PE is also a consideration, given his history of DVT, but his legs are equally edematous without warmth, he does not have tachycardia, and his Wells score is 1.5. Pneumonia is unlikely without fever, focal lung findings, or a focal infiltrate on CXR.  - Admit to  telemetry under observation status for COPD exacerbation, attending Dr. Randolm IdolFletke - Continue O2 supplementation to maintain O2 sats > 88%, with goal of weaning to home dose 2-3 L - Continue Levaquin for 5 days (9/18-9/22), initially given x1 dose IV, will transition to PO - Prednisone 40 mg daily (9/18-9/22) - Duonebs tid with Albuterol q4hrs prn  - Will re-check a BMP and if his K has improved, will give Lasix 40mg  IV x 1 to see how his breathing responds - Daily weights - Strict I/Os - Rule out ACS as described below - Xanax 0.25 BID PRN for anxiety (have decreased from his home dose of 0.5mg  q6hrs prn) - Continuous pulse ox - CBC in AM - PT/OT consult pending - Pt needs pulmonary follow-up prior to discharge, as he has not been seen by pulm in 1.5 years after his previous doctor retired - Can consider increasing Dulera dose from 28400mcg-5mcg to 44700mcg-10mcg on discharge   2. Hypokalemia: admitted with K of 2.7 around 1030 this AM. Received 2 runs of KCl IV and x1 K-Dur 40 mg in ED. Unclear etiology but may be secondary to taking Lasix and HCTZ at home. Has not had diarrhea. - Will recheck another BMET now and replace K as needed - BMET in AM   3. Chest Pain: pressure-like, onset with COPD exacerbation. ASCVD score 22%, rec mod-high intensity statin. No h/o statin use. Troponin elevated at 0.04 (h/o mildly elevated troponin in past 0.04 on 7/17). EKG in ED showed unchanged NSR with occasional PVC from previous admission with exception  to being tachycardic. - Will start aspirin 81 mg daily this hospitalization - Cardiac monitoring - EKG in AM - Continue trending troponin q6h - Need to discuss with patient if he would like to start statin vs defer to PCP. Recommended for patients 75-23 years old with ASCVD risk > 7.5%.   4. AKI: Cr 1.33 on admission. Baseline 0.7-0.9. May be secondary to decreased intake vs third spacing. Pt denies vomiting, diarrhea, or decreased appetite. - Repeat BMP now,  then in the morning - Avoid nephrotoxic meds - Holding home HCTZ - Hold off on IVFs with concern for possible CHF exacerbation  5. H/o Falls: patient states this has occurred twice this week but was due to a stool he was on that was unstable. Denies h/o trauma or LOC. Pelvic Xray neg for acute fractures or dislocation. Pt lives alone and completes ADLs independently. - Fall precaution - Up with assistance - PT/OT consult  6. HTN: normotensive at 120's/70's, on Lasix 40 mg daily, HCTZ 25 mg daily at home. - Holding home HCTZ due to his AKI  7. HFpEF: ECHO on 08/20/2015 showed LV EF: 55% to 60% with G1DD and normal wall motion. - Cardiac monitoring - Plan as above  FEN/GI: heart healthy, miralax Prophylaxis: Lovenox  Disposition: pending improvement of hypokalemia and respiratory status, anticipate d/c to home in the next 1-2 days.  History of Present Illness:  [name] Chaves. is a 75 y.o. male presenting with generalized weakness and shortness of breath of 1 day duration. PMH is significant for COPD (on 2-3L O2 at home), HTN, HFpEF, remote hx DVT.  Patient states shortness of breath started the evening of 9/17. He states his breathing gets worse whenever the dew point is above 50%. He states it feels like he is "breathing through a bucket" this week due to the hurricane weather. Last night, he got into his lounge chair, but it wasn't getting better. He checked his pulse ox, which was 91%. He gave himself a breathing treatment this morning. He rechecked his pulse ox and it was 92-93%. He still felt short of breath at this time so he came to the ED. He is sore and tired from breathing heavy and feels like "someone beat him all night long". He has had increased cough with sputum production for the last week. He endorses constant central chest "pressure". Nothing makes the chest pressure worse.  He was last hospitalized for COPD exacerbation in July. He has been in the ICU for COPD exacerbation  and was intubated, but does not know when stating it occurred "many years ago."   At baseline, he takes care of his ADLs. He lives alone. He is able to ambulate without assistance. On 3 L O2 at home. Was seeing Dr. Delford Field for pulmonology until he retired, has no follow up on file since.  In the ED, he required 3L O2 by [place] to maintain his O2 saturations > 90%. He received duonebs x 4 with mild improvement. He also received IV Solumedrol and IV Levaquin. Labs were notable for a K of 2.7 and he received 2 runs of IV K. I-stat trop was 0.04. EKG was unchanged from prior and did not show any ST or T wave changes. CXR showed chronic interstitial changes, but no focal infiltrate. He ambulated with pulse ox in the ED on 3L and his O2 dropped to 80%, so he was admitted for further management.  Review Of Systems: Per HPI with the following additions: No rhinorrhea,  no sore throat, no nausea, no fevers, no decreased appetite, no constipation.  Patient Active Problem List   Diagnosis Date Noted  . Abdominal pain 08/19/2015  . Elevated troponin 08/19/2015  . Chest pressure 08/19/2015  . Chronic diastolic CHF (congestive heart failure) (HCC)   . Troponin level elevated   . B12 deficiency 05/12/2015  . Iron deficiency anemia   . Abdominal distension   . COPD exacerbation (HCC) 05/06/2015  . Influenza B 05/06/2015  . Cataract of right eye 05/06/2015  . COPD (chronic obstructive pulmonary disease) (HCC) 05/09/2014  . Acute on chronic respiratory failure with hypoxia (HCC) 05/09/2014  . Influenza A H1N1 infection 05/09/2014  . Lobar pneumonia due to unspecified organism 05/05/2014  . Tobacco abuse 01/19/2014  . History of DVT (deep vein thrombosis)   . On home oxygen therapy   . GERD 12/25/2008  . Essential hypertension 11/09/2008  . 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 Acute  respiratory failure with hypoxia (HCC) 01/2014  . Chronic bronchitis    "get it q yr" (07/17/2013)  . Chronic diastolic CHF (congestive heart failure) (HCC)   . COPD (chronic obstructive pulmonary disease) (HCC)   . Daily headache   . DVT (deep venous thrombosis) (HCC) 1990's   "?LLE"  . GERD (gastroesophageal reflux disease)    "related to RX I take" (07/17/2013)  . Hypertension   . Migraine    "all my life; no really bad migraines in 8-9 since work stress is gone" (07/17/2013)  . On home oxygen therapy    "2L 24/7" (08/19/2015)  . Pneumonia    "several times"    Past Surgical History: Past Surgical History:  Procedure Laterality Date  . CATARACT EXTRACTION W/ INTRAOCULAR LENS IMPLANT Right 05/05/2015  . INGUINAL HERNIA REPAIR Left ~ 1990  . TONSILLECTOMY  1949    Social History: Social History  Substance Use Topics  . Smoking status: Former Smoker    Packs/day: 1.00    Years: 10.00    Types: Cigarettes    Quit date: 02/06/1998  . Smokeless tobacco: Never Used  . Alcohol use No   Additional social history: Lives alone. Please also refer to relevant sections of EMR.  Family History: Family History  Problem Relation Age of Onset  . Breast cancer Mother   . Alzheimer's disease Father     Allergies and Medications: Allergies  Allergen Reactions  . Advair Diskus [Fluticasone-Salmeterol] Other (See Comments)    Blurred vision  . Brovana [Arformoterol]     Ineffective--exacerbated coughing/congestion  . Codeine Other (See Comments)    Hallucinations, blurred vision  . Ferrous Sulfate Other (See Comments)    Stomach pain  . Other Swelling    Bicillin injection in 1961--swelling in lower extremities and joints, and whelps up arm.  Has taken penicillin since w/o reaction.  . Prednisone Other (See Comments)    Stomach pain   No current facility-administered medications on file prior to encounter.    Current Outpatient Prescriptions on File Prior to Encounter   Medication Sig Dispense Refill  . albuterol (PROVENTIL HFA;VENTOLIN HFA) 108 (90 BASE) MCG/ACT inhaler Inhale 2 puffs into the lungs every 6 (six) hours as needed for wheezing or shortness of breath. 18 g 4  . ALPRAZolam (XANAX) 0.5 MG tablet Take 0.5 mg by mouth 4 (four) times daily as needed for anxiety.   2  . Calcium Carbonate  Antacid (ANTACID PO) Take 1 tablet by mouth every 4 (four) hours as needed (for acid reflux or heartburn).     Marland Kitchen dextromethorphan-guaiFENesin (MUCINEX DM) 30-600 MG 12hr tablet Take 1 tablet by mouth 2 (two) times daily. 30 tablet 0  . furosemide (LASIX) 40 MG tablet Take 1 tablet (40 mg total) by mouth daily.    . hydrochlorothiazide (HYDRODIURIL) 25 MG tablet Take 25 mg by mouth daily.  1  . ipratropium (ATROVENT) 0.02 % nebulizer solution Inhale 0.5 mg into the lungs every 8 (eight) hours as needed for wheezing or shortness of breath.   3  . ipratropium-albuterol (DUONEB) 0.5-2.5 (3) MG/3ML SOLN Take 3 mLs by nebulization every 6 (six) hours as needed. (Patient taking differently: Take 3 mLs by nebulization every 6 (six) hours as needed (for wheezing or shortness of breath). ) 360 mL 3  . IRON CR PO Take 100 mg by mouth 2 (two) times a week.     . mometasone-formoterol (DULERA) 200-5 MCG/ACT AERO Inhale 2 puffs into the lungs 2 (two) times daily.    . ondansetron (ZOFRAN) 8 MG tablet Take 8 mg by mouth every 8 (eight) hours as needed for nausea or vomiting.     Marland Kitchen oxyCODONE-acetaminophen (PERCOCET/ROXICET) 5-325 MG tablet Take 1 tablet by mouth every 6 (six) hours as needed for moderate pain or severe pain. 30 tablet 0  . pantoprazole (PROTONIX) 40 MG tablet Take 1 tablet (40 mg total) by mouth 2 (two) times daily. 60 tablet 1  . polyethylene glycol (MIRALAX / GLYCOLAX) packet Take 17 g by mouth daily as needed for mild constipation. 14 each 0  . potassium chloride SA (K-DUR,KLOR-CON) 20 MEQ tablet Take 2 tablets (40 mEq total) by mouth daily. 30 tablet 0  . sodium  chloride (OCEAN) 0.65 % SOLN nasal spray Place 1 spray into the nose as needed for congestion (Dry nasal passages).      Objective: BP 124/76   Pulse 91   Temp 98.1 F (36.7 C) (Axillary)   Resp 19   Ht 5\' 7"  (1.702 m)   Wt 178 lb (80.7 kg)   SpO2 94%   BMI 27.88 kg/m  Exam: General: obese, well nourished, well developed, anxious with non-toxic appearance HEENT: normocephalic, atraumatic, moist mucous membranes Neck: supple, non-tender without lymphadenopathy, small left anterior lipoma appreciated CV: regular rate and rhythm without murmurs, rubs, or gallops Lungs: mild bilateral expiratory wheeze at apices and mild bibasilar coarse crackles with normal work of breathing on 3 L [place], moderately decreased air movement throughout all lung fields. Abdomen: soft, non-tender, no masses or organomegaly palpable, normoactive bowel sounds Skin: warm, dry, no rashes or lesions, cap refill < 2 seconds Extremities: warm and well perfused, normal tone, mild pitting edema bilaterally of LE below knee, no warmth or erythema Neuro: Awake, alert, oriented; CN 2-12 grossly intact Psych: Appropriate affect, normal behavior  Labs and Imaging: CBC BMET   Recent Labs Lab 10/25/15 1022  WBC 9.2  HGB 10.2*  HCT 35.6*  PLT 282    Recent Labs Lab 10/25/15 1022  NA 137  K 2.7*  CL 89*  CO2 36*  BUN 16  CREATININE 1.33*  GLUCOSE 95  CALCIUM 8.9     CXR: 10/25/15 FINDINGS: Cardiac shadow is within normal limits. Diffuse interstitial changes are again identified but stable from the prior exam consistent with underlying scarring. No focal infiltrate or sizable effusion is seen. Compression deformities are again noted in the mid and lower thoracic  spine. Some of these have progressed in the interval from the prior exam. Correlation with point tenderness is recommended.  IMPRESSION: Chronic interstitial changes. Compression deformities of the thoracic spine which have progressed in the  interval from the prior exam. Correlation to point tenderness is recommended.  Xray of Pelvis: 10/25/15 FINDINGS: No fracture or dislocation is seen. Bilateral hip joint spaces are preserved. Visualized bony pelvis appears intact. Mild degenerative changes of the lower lumbar spine.  IMPRESSION: No fracture or dislocation is seen.   Wendee Beavers, DO 10/25/2015, 8:31 PM PGY-1, [facility] FPTS Intern pager: [phone], text pages welcome   FPTS Upper-Level Resident Addendum  I have independently interviewed and examined the patient. I have discussed the above with the original author and agree with their documentation. My edits for correction/addition/clarification are in blue. Please see also any attending notes.   Willadean Carol, MD PGY-2, [facility] FPTS Service pager: [phone] (text pages welcome through [employer])

## 2015-10-25 NOTE — Care Management Note (Signed)
Case Management Note  Patient Details  Name: Janie Morningddie Guthridge Jr. MRN: [id number] Date of Birth: 23-Aug-1940  Subjective/Objective:                  75 yo male  In ER with SOB, fall.  From home alone.    Action/Plan: Follow for disposition needs.   Expected Discharge Date:  10/25/15               Expected Discharge Plan:     In-House Referral:     Discharge planning Services     Post Acute Care Choice:    Choice offered to:     DME Arranged:    DME Agency:     HH Arranged:    HH Agency:     Status of Service:     If discussed at [employer] Meetings, dates discussed:    Additional Comments: I have recommended that this patient have Home Health Services but he declines at this time. I have discussed the risks and benefits of this service with him. CM informed patient that if he changed his mind, his primary care physician can make arrangements from his office.The patient verbalizes understanding.   Oletta CohnWood, Oaklynn Stierwalt, RN 10/25/2015, 2:54 PM

## 2015-10-25 NOTE — ED Notes (Signed)
Ambulated pt, after 3rd step pt became short of breath and oxygen dropped to 80 % O2, informed Chrislyn-RN.

## 2015-10-25 NOTE — ED Provider Notes (Signed)
[facility]-EMERGENCY DEPT Provider Note   CSN: [id number] Arrival date & time: 10/25/15  7829     History   Chief Complaint Chief Complaint  Patient presents with  . Shortness of Breath    pt feeling SOb and has been falling recentgly pt c/o feeling pain all over     HPI [name] [name]. is a 75 y.o. male.   Shortness of Breath  This is a recurrent problem. The problem occurs continuously.The problem has been gradually worsening. Associated symptoms include rhinorrhea, sputum production and wheezing. Pertinent negatives include no fever, no cough, no hemoptysis, no orthopnea and no abdominal pain. He has tried nothing for the symptoms. The treatment provided no relief. He has had no prior hospitalizations. He has had prior ED visits. He has had no prior ICU admissions.    Past Medical History:  Diagnosis Date  . Acute respiratory failure with hypoxia (HCC) 01/2014  . Chronic bronchitis    "get it q yr" (07/17/2013)  . Chronic diastolic CHF (congestive heart failure) (HCC)   . COPD (chronic obstructive pulmonary disease) (HCC)   . Daily headache   . DVT (deep venous thrombosis) (HCC) 1990's   "?LLE"  . GERD (gastroesophageal reflux disease)    "related to RX I take" (07/17/2013)  . Hypertension   . Migraine    "all my life; no really bad migraines in 8-9 since work stress is gone" (07/17/2013)  . On home oxygen therapy    "2L 24/7" (08/19/2015)  . Pneumonia    "several times"    Patient Active Problem List   Diagnosis Date Noted  . Abdominal pain 08/19/2015  . Elevated troponin 08/19/2015  . Chest pressure 08/19/2015  . Chronic diastolic CHF (congestive heart failure) (HCC)   . Troponin level elevated   . B12 deficiency 05/12/2015  . Iron deficiency anemia   . Abdominal distension   . COPD exacerbation (HCC) 05/06/2015  . Influenza B 05/06/2015  . Cataract of right eye 05/06/2015  . COPD (chronic obstructive pulmonary disease) (HCC) 05/09/2014  . Acute on chronic  respiratory failure with hypoxia (HCC) 05/09/2014  . Influenza A H1N1 infection 05/09/2014  . Lobar pneumonia due to unspecified organism 05/05/2014  . Tobacco abuse 01/19/2014  . History of DVT (deep vein thrombosis)   . On home oxygen therapy   . GERD 12/25/2008  . Essential hypertension 11/09/2008  . 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 CATARACT EXTRACTION W/ INTRAOCULAR LENS IMPLANT Right 05/05/2015  . INGUINAL HERNIA REPAIR Left ~ 1990  . TONSILLECTOMY  1949       Home Medications    Prior to Admission medications   Medication Sig Start Date End Date Taking? Authorizing Provider  albuterol (ACCUNEB) 0.63 MG/3ML nebulizer solution Take 1 ampule by nebulization every 6 (six) hours as needed for wheezing.   Yes Historical Provider, MD  albuterol (PROVENTIL HFA;VENTOLIN HFA) 108 (90 BASE) MCG/ACT inhaler Inhale 2 puffs into the lungs every 6 (six) hours as needed for wheezing or shortness of breath. 04/13/14  Yes Storm Frisk, MD  ALPRAZolam Prudy Feeler) 0.5 MG tablet Take 0.5 mg by mouth 4 (four) times daily as needed for anxiety.  04/06/14  Yes Historical Provider, MD  Aspirin-Acetaminophen-Caffeine (GOODY HEADACHE PO) Take 1 packet by mouth daily as needed. pain   Yes Historical Provider, MD  Aspirin-Caffeine (BAYER BACK & BODY PO) Take 1 tablet by mouth daily  as needed. pain   Yes Historical Provider, MD  Calcium Carbonate Antacid (ANTACID PO) Take 1 tablet by mouth every 4 (four) hours as needed (for acid reflux or heartburn).    Yes Historical Provider, MD  dextromethorphan-guaiFENesin (MUCINEX DM) 30-600 MG 12hr tablet Take 1 tablet by mouth 2 (two) times daily. 05/10/15  Yes Ruben Im, MD  furosemide (LASIX) 40 MG tablet Take 1 tablet (40 mg total) by mouth daily. 08/23/15  Yes Zannie Cove, MD  hydrochlorothiazide (HYDRODIURIL) 25 MG tablet Take 25 mg by mouth daily. 07/06/15   Yes Historical Provider, MD  ipratropium (ATROVENT) 0.02 % nebulizer solution Inhale 0.5 mg into the lungs every 8 (eight) hours as needed for wheezing or shortness of breath.  07/28/15  Yes Historical Provider, MD  ipratropium-albuterol (DUONEB) 0.5-2.5 (3) MG/3ML SOLN Take 3 mLs by nebulization every 6 (six) hours as needed. Patient taking differently: Take 3 mLs by nebulization every 6 (six) hours as needed (for wheezing or shortness of breath).  05/12/14  Yes Albertine Grates, MD  IRON CR PO Take 100 mg by mouth 2 (two) times a week.    Yes Historical Provider, MD  mometasone-formoterol (DULERA) 200-5 MCG/ACT AERO Inhale 2 puffs into the lungs 2 (two) times daily.   Yes Historical Provider, MD  ondansetron (ZOFRAN) 8 MG tablet Take 8 mg by mouth every 8 (eight) hours as needed for nausea or vomiting.  08/19/15  Yes Historical Provider, MD  oxyCODONE-acetaminophen (PERCOCET/ROXICET) 5-325 MG tablet Take 1 tablet by mouth every 6 (six) hours as needed for moderate pain or severe pain. 08/23/15  Yes Zannie Cove, MD  pantoprazole (PROTONIX) 40 MG tablet Take 1 tablet (40 mg total) by mouth 2 (two) times daily. 11/01/13  Yes Vassie Loll, MD  polyethylene glycol [facility] / GLYCOLAX) packet Take 17 g by mouth daily as needed for mild constipation. 08/23/15  Yes Zannie Cove, MD  potassium chloride SA (K-DUR,KLOR-CON) 20 MEQ tablet Take 2 tablets (40 mEq total) by mouth daily. 08/23/15  Yes Zannie Cove, MD  sodium chloride (OCEAN) 0.65 % SOLN nasal spray Place 1 spray into the nose as needed for congestion (Dry nasal passages).   Yes Historical Provider, MD  SPIRIVA HANDIHALER 18 MCG inhalation capsule Place 1 puff into inhaler and inhale daily as needed for wheezing. 09/07/15  Yes Historical Provider, MD  vitamin E 100 UNIT capsule Take 100 Units by mouth daily as needed. supplement   Yes Historical Provider, MD    Family History Family History  Problem Relation Age of Onset  . Breast cancer Mother   .  Alzheimer's disease Father     Social History Social History  Substance Use Topics  . Smoking status: Former Smoker    Packs/day: 1.00    Years: 10.00    Types: Cigarettes    Quit date: 02/06/1998  . Smokeless tobacco: Never Used  . Alcohol use No     Allergies   Advair diskus [fluticasone-salmeterol]; Brovana [arformoterol]; Codeine; Ferrous sulfate; Other; and Prednisone   Review of Systems Review of Systems  Constitutional: Negative for fever.  HENT: Positive for rhinorrhea.   Respiratory: Positive for sputum production, shortness of breath and wheezing. Negative for cough and hemoptysis.   Cardiovascular: Negative for orthopnea.  Gastrointestinal: Negative for abdominal pain.  All other systems reviewed and are negative.    Physical Exam Updated Vital Signs BP 124/74   Pulse 84   Temp 98.5 F (36.9 C) (Oral)   Resp 19   Ht  5\' 7"  (1.702 m)   Wt 178 lb (80.7 kg)   SpO2 96%   BMI 27.88 kg/m   Physical Exam  Constitutional: He appears well-developed and well-nourished.  HENT:  Head: Normocephalic and atraumatic.  Eyes: Conjunctivae are normal.  Neck: Neck supple.  Cardiovascular: Normal rate and regular rhythm.   No murmur heard. Pulmonary/Chest: Effort normal. No respiratory distress. He has decreased breath sounds. He has wheezes. He exhibits no tenderness.  Abdominal: Soft. There is no tenderness.  Musculoskeletal: He exhibits no edema.  Neurological: He is alert.  Skin: Skin is warm and dry.  Psychiatric: He has a normal mood and affect.  Nursing note and vitals reviewed.    ED Treatments / Results  Labs (all labs ordered are listed, but only abnormal results are displayed) Labs Reviewed  BASIC METABOLIC PANEL - Abnormal; Notable for the following:       Result Value   Potassium 2.7 (*)    Chloride 89 (*)    CO2 36 (*)    Creatinine, Ser 1.33 (*)    GFR calc non Af Amer 51 (*)    GFR calc Af Amer 59 (*)    All other components within normal  limits  CBC - Abnormal; Notable for the following:    RBC 3.97 (*)    Hemoglobin 10.2 (*)    HCT 35.6 (*)    MCH 25.7 (*)    MCHC 28.7 (*)    RDW 17.6 (*)    All other components within normal limits  TROPONIN I - Abnormal; Notable for the following:    Troponin I 0.04 (*)    All other components within normal limits  BRAIN NATRIURETIC PEPTIDE - Abnormal; Notable for the following:    B Natriuretic Peptide 111.1 (*)    All other components within normal limits  URINALYSIS, ROUTINE W REFLEX MICROSCOPIC (NOT AT [facility])  CBG MONITORING, ED    EKG  EKG Interpretation  Date/Time:  Monday October 25 2015 10:15:00 EDT Ventricular Rate:  101 PR Interval:  160 QRS Duration: 100 QT Interval:  352 QTC Calculation: 456 R Axis:   43 Text Interpretation:  Sinus tachycardia with Premature supraventricular complexes Low voltage QRS Incomplete right bundle branch block Nonspecific ST abnormality Abnormal ECG faster pace, no other obvious changes comparec to july 14 Confirmed by [facility] MD, Shenaya Lebo [phone]) on 10/25/2015 12:31:49 PM       Radiology Dg Chest 2 View  Result Date: 10/25/2015 CLINICAL DATA:  Shortness of Breath EXAM: CHEST  2 VIEW COMPARISON:  05/06/2015 FINDINGS: Cardiac shadow is within normal limits. Diffuse interstitial changes are again identified but stable from the prior exam consistent with underlying scarring. No focal infiltrate or sizable effusion is seen. Compression deformities are again noted in the mid and lower thoracic spine. Some of these have progressed in the interval from the prior exam. Correlation with point tenderness is recommended. IMPRESSION: Chronic interstitial changes. Compression deformities of the thoracic spine which have progressed in the interval from the prior exam. Correlation to point tenderness is recommended. Electronically Signed   By: Alcide Clever M.D.   On: 10/25/2015 15:46   Dg Pelvis 1-2 Views  Result Date: 10/25/2015 CLINICAL DATA:   Multiple falls, bilateral pelvic tenderness EXAM: PELVIS - 1-2 VIEW COMPARISON:  CT abdomen pelvis dated 08/19/2015 FINDINGS: No fracture or dislocation is seen. Bilateral hip joint spaces are preserved. Visualized bony pelvis appears intact. Mild degenerative changes of the lower lumbar spine. IMPRESSION: No fracture or  dislocation is seen. Electronically Signed   By: Charline BillsSriyesh  Krishnan M.D.   On: 10/25/2015 13:41    Procedures Procedures (including critical care time)  Medications Ordered in ED Medications  potassium chloride 10 mEq in 100 mL IVPB (10 mEq Intravenous New Bag/Given 10/25/15 1642)  levofloxacin (LEVAQUIN) IVPB 750 mg (not administered)  ipratropium-albuterol (DUONEB) 0.5-2.5 (3) MG/3ML nebulizer solution 3 mL (3 mLs Nebulization Given 10/25/15 1447)  methylPREDNISolone sodium succinate (SOLU-MEDROL) 125 mg/2 mL injection 125 mg (125 mg Intravenous Given 10/25/15 1431)  potassium chloride SA (K-DUR,KLOR-CON) CR tablet 40 mEq (40 mEq Oral Given 10/25/15 1442)  magnesium sulfate IVPB 2 g 50 mL (0 g Intravenous Stopped 10/25/15 1643)  fentaNYL (SUBLIMAZE) injection 50 mcg (50 mcg Intravenous Given 10/25/15 1429)  ipratropium-albuterol (DUONEB) 0.5-2.5 (3) MG/3ML nebulizer solution 3 mL (3 mLs Nebulization Given 10/25/15 1641)     Initial Impression / Assessment and Plan / ED Course  I have reviewed the triage vital signs and the nursing notes.  Pertinent labs & imaging results that were available during my care of the patient were reviewed by me and considered in my medical decision making (see chart for details).  Clinical Course    COPD Exacerbation with hypokalemia. Patient given multiple rounds of DuoNeb's still significantly tachypneic. Not able to complete full sentences. Not able to walk without getting desaturations to about 80%. Hypokalemia without EKG changes. Runs of potassium started.  Discussed case with family medicine resident will admit to family medicine under Dr.  Jennette KettleNeal.  Final Clinical Impressions(s) / ED Diagnoses   Final diagnoses:  COPD exacerbation (HCC)  Hypokalemia    New Prescriptions New Prescriptions   No medications on file     Marily MemosJason Briseis Aguilera, MD 10/25/15 1801

## 2015-10-25 NOTE — ED Notes (Signed)
Patient transported to X-ray 

## 2015-10-25 NOTE — ED Triage Notes (Signed)
Pt states that he has been falling over the past few days pt has bruise noted to L lower abdomen

## 2015-10-25 NOTE — ED Notes (Signed)
MD at bedside. 

## 2015-10-25 NOTE — ED Notes (Signed)
Attempted to call report

## 2015-10-25 NOTE — ED Notes (Signed)
Per Micromedex Levaquin and Potassium Chloride compatible.

## 2015-10-26 ENCOUNTER — Observation Stay (HOSPITAL_COMMUNITY): Payer: Medicare Other

## 2015-10-26 DIAGNOSIS — E876 Hypokalemia: Secondary | ICD-10-CM | POA: Diagnosis not present

## 2015-10-26 DIAGNOSIS — J9621 Acute and chronic respiratory failure with hypoxia: Secondary | ICD-10-CM

## 2015-10-26 DIAGNOSIS — J441 Chronic obstructive pulmonary disease with (acute) exacerbation: Secondary | ICD-10-CM

## 2015-10-26 DIAGNOSIS — N179 Acute kidney failure, unspecified: Secondary | ICD-10-CM

## 2015-10-26 DIAGNOSIS — I5033 Acute on chronic diastolic (congestive) heart failure: Secondary | ICD-10-CM

## 2015-10-26 DIAGNOSIS — I110 Hypertensive heart disease with heart failure: Secondary | ICD-10-CM | POA: Diagnosis not present

## 2015-10-26 DIAGNOSIS — W19XXXA Unspecified fall, initial encounter: Secondary | ICD-10-CM

## 2015-10-26 DIAGNOSIS — Z9981 Dependence on supplemental oxygen: Secondary | ICD-10-CM | POA: Diagnosis not present

## 2015-10-26 DIAGNOSIS — M4854XA Collapsed vertebra, not elsewhere classified, thoracic region, initial encounter for fracture: Secondary | ICD-10-CM | POA: Diagnosis not present

## 2015-10-26 DIAGNOSIS — I10 Essential (primary) hypertension: Secondary | ICD-10-CM

## 2015-10-26 LAB — CBC
HEMATOCRIT: 33.2 % — AB (ref 39.0–52.0)
HEMOGLOBIN: 9.2 g/dL — AB (ref 13.0–17.0)
MCH: 25 pg — ABNORMAL LOW (ref 26.0–34.0)
MCHC: 27.7 g/dL — AB (ref 30.0–36.0)
MCV: 90.2 fL (ref 78.0–100.0)
Platelets: 264 10*3/uL (ref 150–400)
RBC: 3.68 MIL/uL — ABNORMAL LOW (ref 4.22–5.81)
RDW: 17.6 % — AB (ref 11.5–15.5)
WBC: 5.9 10*3/uL (ref 4.0–10.5)

## 2015-10-26 LAB — BASIC METABOLIC PANEL
ANION GAP: 10 (ref 5–15)
BUN: 13 mg/dL (ref 6–20)
CHLORIDE: 90 mmol/L — AB (ref 101–111)
CO2: 36 mmol/L — ABNORMAL HIGH (ref 22–32)
Calcium: 8.5 mg/dL — ABNORMAL LOW (ref 8.9–10.3)
Creatinine, Ser: 1.1 mg/dL (ref 0.61–1.24)
GFR calc Af Amer: >60 mL/min (ref >60)
GFR calc non Af Amer: >60 mL/min (ref >60)
Glucose, Bld: 168 mg/dL — ABNORMAL HIGH (ref 65–99)
POTASSIUM: 3.6 mmol/L (ref 3.5–5.1)
Sodium: 136 mmol/L (ref 135–145)

## 2015-10-26 LAB — RETICULOCYTES
RBC.: 3.79 MIL/uL — ABNORMAL LOW (ref 4.22–5.81)
RETIC COUNT ABSOLUTE: 83.4 10*3/uL (ref 19.0–186.0)
Retic Ct Pct: 2.2 % (ref 0.4–3.1)

## 2015-10-26 LAB — IRON AND TIBC
Iron: 28 ug/dL — ABNORMAL LOW (ref 45–182)
SATURATION RATIOS: 8 % — AB (ref 17.9–39.5)
TIBC: 344 ug/dL (ref 250–450)
UIBC: 316 ug/dL

## 2015-10-26 LAB — TROPONIN I
Troponin I: 0.03 ng/mL (ref <0.03)
Troponin I: 0.04 ng/mL (ref <0.03)

## 2015-10-26 LAB — FERRITIN: FERRITIN: 15 ng/mL — AB (ref 24–336)

## 2015-10-26 LAB — VITAMIN B12: Vitamin B-12: 286 pg/mL (ref 180–914)

## 2015-10-26 MED ORDER — SIMETHICONE 80 MG PO CHEW
80.0000 mg | CHEWABLE_TABLET | Freq: Four times a day (QID) | ORAL | Status: DC | PRN
Start: 2015-10-26 — End: 2015-10-27

## 2015-10-26 MED ORDER — ATORVASTATIN CALCIUM 40 MG PO TABS
40.0000 mg | ORAL_TABLET | Freq: Every day | ORAL | Status: DC
  Administered 2015-10-26: 40 mg via ORAL
  Filled 2015-10-26: qty 1

## 2015-10-26 MED ORDER — ALUM & MAG HYDROXIDE-SIMETH 200-200-20 MG/5ML PO SUSP
30.0000 mL | Freq: Four times a day (QID) | ORAL | Status: DC | PRN
  Administered 2015-10-26 – 2015-10-27 (×4): 30 mL via ORAL
  Filled 2015-10-26 (×4): qty 30

## 2015-10-26 MED ORDER — ORAL CARE MOUTH RINSE
15.0000 mL | Freq: Two times a day (BID) | OROMUCOSAL | Status: DC
  Administered 2015-10-26 – 2015-10-27 (×2): 15 mL via OROMUCOSAL

## 2015-10-26 MED ORDER — MOMETASONE FURO-FORMOTEROL FUM 200-5 MCG/ACT IN AERO
2.0000 | INHALATION_SPRAY | Freq: Two times a day (BID) | RESPIRATORY_TRACT | Status: DC
  Administered 2015-10-26 – 2015-10-27 (×3): 2 via RESPIRATORY_TRACT
  Filled 2015-10-26: qty 8.8

## 2015-10-26 MED ORDER — TIOTROPIUM BROMIDE MONOHYDRATE 18 MCG IN CAPS
18.0000 ug | ORAL_CAPSULE | Freq: Every day | RESPIRATORY_TRACT | Status: DC
  Administered 2015-10-26 – 2015-10-27 (×2): 18 ug via RESPIRATORY_TRACT
  Filled 2015-10-26: qty 5

## 2015-10-26 MED ORDER — SIMETHICONE 80 MG PO CHEW
80.0000 mg | CHEWABLE_TABLET | Freq: Once | ORAL | Status: AC
  Administered 2015-10-26: 80 mg via ORAL
  Filled 2015-10-26: qty 1

## 2015-10-26 MED ORDER — FUROSEMIDE 10 MG/ML IJ SOLN
80.0000 mg | Freq: Once | INTRAMUSCULAR | Status: AC
  Administered 2015-10-26: 80 mg via INTRAVENOUS
  Filled 2015-10-26: qty 8
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
## 2015-10-26 NOTE — Progress Notes (Signed)
Family Medicine Teaching Service Daily Progress Note Intern Pager: [phone]  Patient name: [name] Morningddie Lust Jr. Medical record number: [id number] Date of birth: 21-Feb-1940 Age: 75 y.o. 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 is a 75 y.o. male presenting with generalized weakness and shortness of breath of 1 day duration . PMH is significant for COPD (on 2-3L O2 at home), HTN, HFpEF, hx DVT.  1. Shortness of breath: Likely COPD exacerbation with component of CHF exacerbation. Has had acute shortness of breath with generalized weakness of 1 day duration. Patient states h/o productive cough for the past week. Ongoing issue with gradual worsening of symptoms over the years. Was seeing Dr. Delford FieldWright 1.5 years ago but he retired with no visit on record since. Patient states he was intubated "many years ago" in the ICU for an exacerbation but cannot remember when and there are none on file. H/o DVT in 1990 but not currently on prophylaxis. Given x4 duoneb treatments, x1 dose of Levaquin, and x1 mag sulf in ED. Sat well on 3 L [place] in ED (home O2 dose). Patient was not able to get out of bed during examination due to weakness. CHF is a consideration, as he has significant pitting edema almost to the knee bilaterally. He also has crackles in his lung bases. BNP mildly elevated at 111, increased from 30, 58, and 98 at previous hospitalizations. ACS is also on the differential, given his constant, central chest pressure; however, EKG does not show any signs of ischemia/infarct and I-stat troponin was 0.04. PE is also a consideration, given his history of DVT, but his legs are equally edematous without warmth, he does not have tachycardia, and his Wells score is 1.5. Pneumonia is unlikely without fever, focal lung  findings, or a focal infiltrate on CXR.  - On telemetry under observation status for COPD exacerbation - Continue O2 supplementation to maintain O2 sats >88%, with goal of weaning to home dose 2-3 L - Continue Levaquin for 5 days (9/18-9/22), initially given x1 dose IV, will transition to PO - Prednisone 40 mg daily (9/18-9/22) - Duonebs tid with Albuterol q4hrs prn  - Given Lasix 40 mg IV x1, due to resolved hypokalemia, SOB improved, will give 80 mg IV 9/19 and monitor UO - Daily weights - Strict I/Os - Rule out ACS as described below - Xanax 0.25 BID PRN for anxiety (have decreased from his home dose of 0.5mg  q6hrs prn), not needed so far - Continuous pulse ox - Continue trending CBC in AM - PT/OT consult pending - Pt needs pulmonary follow-up prior to discharge, as he has not been seen by pulm in 1.5 years after his previous doctor retired - Can consider increasing Dulera dose from 23300mcg-5mcg to 44800mcg-10mcg on discharge  - Home Dulera and Spiriva started AM of 9/19  2. Hypokalemia: resolved K of 2.7>3.6. Received 2 runs of KCl IV and x1 K-Dur 40 mg in ED. Unclear etiology but may be secondary to taking Lasix and HCTZ at home. Has not had diarrhea. No further K supplement since admission. - Continue trending BMET in AM - Replace K as needed  3. Chest Pain: pressure-like, onset with COPD exacerbation. ASCVD score 22%, rec mod-high intensity statin. No h/o statin use. Troponin continues to be elevated at 0.04 (h/o mildly elevated troponin  in past 0.04 on 7/17). EKG in ED showed unchanged NSR with occasional PVC from previous admission with exception to being tachycardic. EKG 9/19 was consistent with previous but V6 difficult to interpret, artifact vs improper lead placement, will reorder. - Will start aspirin 81 mg daily this hospitalization - Cardiac monitoring - F/u EKG repeat - Continue trending troponin q6h - Atorvastatin 40 mg   4. AKI: resolved, Cr 1.33>1.10. Baseline  0.7-0.9. May be secondary to decreased intake vs third spacing. Pt denies vomiting, diarrhea, or decreased appetite. - Continue trending BMET in AM - Avoid nephrotoxic meds - Holding home HCTZ - Hold off on IVFs with concern for possible CHF exacerbation - Given x1 dose IV Lasix 40 mg for concerns of fluid overload 9/18 around 2400  5. H/o Falls: patient states this has occurred twice this week but was due to a stool he was on that was unstable. Denies h/o trauma or LOC. Pelvic Xray neg for acute fractures or dislocation. Pt lives alone and completes ADLs independently. - Fall precaution - Up with assistance - PT/OT consult pending  6. HTN: normotensive at 108-136/68-85, on Lasix 40 mg daily and HCTZ 25 mg daily at home. - Holding home HCTZ - Given Lasix x1 40 mg IV  7. HFpEF: ECHO on 08/20/2015 showed LV EF: 55% to 60% with G1DD and normal wall motion. - Cardiac monitoring - Plan as above  8. Anemia: Hgb 9.3>9.2 since admission with previous levels at 10's (7/17). Last iron studies on 3/17, showed low ferritin 17, low iron 15, normal TIBC 386, normal folate 15.8, low B12 151 (1299 7 years ago), normal retic 1.3. - Will repeat iron studies for evaluation of anemia as this may exacerbate the COPD symptoms - FOBT pending  FEN/GI: heart healthy, miralax  Prophylaxis: Lovenox  Disposition: pending improvement of respiratory status in the context of mixed COPD and CHF exacerbation  Subjective:  Afebrile with stable vitals overnight.States breathing is much improved. States he is at baseline with no complaints. Says he thinks his hospitalization may be due to missing his fluid pills prior to admission. Responded well to lasix overnight.  Objective: Temp:  [98.1 F (36.7 C)-98.7 F (37.1 C)] 98.7 F (37.1 C) (09/19 0643) Pulse Rate:  [73-100] 85 (09/19 0643) Resp:  [15-24] 18 (09/19 0643) BP: (108-136)/(68-85) 124/73 (09/19 0643) SpO2:  [90 %-100 %] 96 % (09/19 0643) Weight:   [168 lb 3.4 oz (76.3 kg)-178 lb (80.7 kg)] 168 lb 3.4 oz (76.3 kg) (09/19 0500) Physical Exam: General: well nourished, well developed, in no acute distress with non-toxic appearance HEENT: normocephalic, atraumatic, moist mucous membranes Neck: supple, non-tender without lymphadenopathy CV: regular rate and rhythm without murmurs, rubs, or gallops Lungs: clear to auscultation bilaterally with normal work of breathing Abdomen: soft, non-tender, no masses or organomegaly palpable, normoactive bowel sounds Skin: warm, dry, no rashes or lesions, cap refill < 2 seconds Extremities: warm and well perfused, normal tone  Laboratory:  Recent Labs Lab 10/25/15 1022 10/25/15 2132 10/26/15 0347  WBC 9.2 5.9 5.9  HGB 10.2* 9.3* 9.2*  HCT 35.6* 33.5* 33.2*  PLT 282 256 264    Recent Labs Lab 10/25/15 1022 10/25/15 2132 10/26/15 0347  NA 137 135 136  K 2.7* 3.7 3.6  CL 89* 92* 90*  CO2 36* 34* 36*  BUN 16 14 13   CREATININE 1.33* 1.22 1.10  CALCIUM 8.9 8.3* 8.5*  GLUCOSE 95 196* 168*   BNP:  111 (previous 57.7 on 4/17) UA:  Neg Troponin:  0.04>0.03>0.04 (h/o mild elevation 0.04 7/17)  Imaging/Diagnostic Tests: CXR: 10/25/15 FINDINGS: Cardiac shadow is within normal limits. Diffuse interstitial changes are again identified but stable from the prior exam consistent with underlying scarring. No focal infiltrate or sizable effusion is seen. Compression deformities are again noted in the mid and lower thoracic spine. Some of these have progressed in the interval from the prior exam. Correlation with point tenderness is recommended.  IMPRESSION: Chronic interstitial changes. Compression deformities of the thoracic spine which have progressed in the interval from the prior exam. Correlation to point tenderness is recommended.  Xray of Pelvis: 10/25/15 FINDINGS: No fracture or dislocation is seen. Bilateral hip joint spaces are preserved. Visualized bony pelvis appears  intact. Mild degenerative changes of the lower lumbar spine.  IMPRESSION: No fracture or dislocation is seen.    Wendee Beavers, DO 10/26/2015, 7:28 AM PGY-1, [facility] FPTS Intern pager: [phone], text pages welcome

## 2015-10-26 NOTE — Progress Notes (Signed)
NURSING PROGRESS NOTE  [name] HillMRN: [id number] Admission Data: 10/26/15 at 9:04PM Attending Provider: Uvaldo RisingKyle J Fletke, MD PCP: [name] [name], [name] M, NP Code status: Full  Allergies:  Allergies  Allergen Reactions  . Advair Diskus [Fluticasone-Salmeterol] Other (See Comments)    Blurred vision  . Brovana [Arformoterol]     Ineffective--exacerbated coughing/congestion  . Codeine Other (See Comments)    Hallucinations, blurred vision  . Ferrous Sulfate Other (See Comments)    Stomach pain  . Other Swelling    Bicillin injection in 1961--swelling in lower extremities and joints, and whelps up arm.  Has taken penicillin since w/o reaction.  . Prednisone Other (See Comments)    Stomach pain    Past Medical History:  Past Medical History:  Diagnosis Date  . Acute respiratory failure with hypoxia (HCC) 01/2014  . Chronic bronchitis    "get it q yr" (07/17/2013)  . Chronic diastolic CHF (congestive heart failure) (HCC)   . COPD (chronic obstructive pulmonary disease) (HCC)   . Daily headache   . DVT (deep venous thrombosis) (HCC) 1990's   "?LLE"  . GERD (gastroesophageal reflux disease)    "related to RX I take" (07/17/2013)  . Hypertension   . Migraine    "all my life; no really bad migraines in 8-9 since work stress is gone" (07/17/2013)  . On home oxygen therapy    "2L 24/7" (08/19/2015)  . Pneumonia    "several times"    Past Surgical History:  Past Surgical History:  Procedure Laterality Date  . CATARACT EXTRACTION W/ INTRAOCULAR LENS IMPLANT Right 05/05/2015  . INGUINAL HERNIA REPAIR Left ~ 1990  . TONSILLECTOMY  1949    [name] [name] is a 75 y.o. male patient, arrived to floor in room 5W27 via stretcher, transferred from ED. Patient alert and oriented X 4. No acute distress noted. Complains of pain 2/10 in left abdomen.   Vital signs: Oral temperature 98.2 F (36.8 C), Blood pressure 136/73, Pulse 87, RR 18, SpO2 96 % on 3 liters of oxygen. Height  5'7", weight 172 lbs (78.155 kg).   Cardiac monitoring: Telemetry box 5W # 21 in place; verified by Ahmed PrimaAria D, Nurse Tech  IV access: Right AC; condition patent; skin tear located next to IV site  Skin: intact, no pressure ulcer noted in sacral area; ecchymosis generalized over body; skin tear located beside IV site   Patient's ID armband verified with patient and in place. Information packet given to patient. Fall risk assessed, SR up X2, patient able to verbalize understanding of risks associated with falls and to call nurse or staff to assist before getting out of bed. Patient oriented to room and equipment. Call bell within reach.

## 2015-10-26 NOTE — Care Management Obs Status (Signed)
MEDICARE OBSERVATION STATUS NOTIFICATION   Patient Details  Name: [name] Morningddie Boisclair Jr. MRN: [id number] Date of Birth: Feb 24, 1940   Medicare Observation Status Notification Given:  Yes Cm explained MOON letter to patient at the bedside and confirmed understanding using the teach back method. Cm provided patient with copy and patient verbalized understanding and denied further questions at this time.   Darcel SmallingAnna C Ynez Eugenio, RN 10/26/2015, 7:58 PM

## 2015-10-26 NOTE — Evaluation (Signed)
Physical Therapy Evaluation Patient Details Name: [name] [name]. MRN: [id number] DOB: [birth year] Today's Date: 10/26/2015   History of Present Illness  75 y.o. male presenting with generalized weakness and shortness of breath of 1 day duration . PMH is significant for COPD (on 2-3L O2 at home), HTN, HFpEF, hx DVT  Clinical Impression  Patient demonstrates deficits in functional mobility as indicated below. Will need continued skilled PT to address deficits and maximize function. Will see as indicated and progress as tolerated. Spoke with patient regarding HHPT, patient declines at this time.  OF NOTE: during ambulation patient with desaturation to 86% on 4 liters, encouraged rest, pursed lip breathing, and nasal inhalation. Took several minutes to rebound >90%    Follow Up Recommendations Supervision - Intermittent (Recommend HHPT but pt previously not agreeable)    Equipment Recommendations  Other (comment) (4 wheeled rollator walker with seat )    Recommendations for Other Services       Precautions / Restrictions Precautions Precautions: Fall Precaution Comments: O2 dependent Restrictions Weight Bearing Restrictions: No      Mobility  Bed Mobility Overal bed mobility: Needs Assistance Bed Mobility: Supine to Sit     Supine to sit: Supervision     General bed mobility comments: increased time and effort to perform  Transfers Overall transfer level: Needs assistance Equipment used: Rolling walker (2 wheeled) Transfers: Sit to/from Stand Sit to Stand: Supervision         General transfer comment: VCs for hand placement and positioning for safety  Ambulation/Gait Ambulation/Gait assistance: Min guard Ambulation Distance (Feet): 160 Feet Assistive device: Rolling walker (2 wheeled) Gait Pattern/deviations: Step-through pattern;Decreased stride length;Drifts right/left;Trunk flexed Gait velocity: decreased Gait velocity interpretation: Below normal speed for 75/gender General Gait Details: one standing rest break. Desaturation to 84% during ambulation, VCs for pursed lip breathing and nasal inhalation.   Stairs            Wheelchair Mobility    Modified Rankin (Stroke Patients Only)       Balance Overall balance assessment: History of Falls                                           Pertinent Vitals/Pain      Home Living Family/patient expects to be discharged to:: Private residence Living Arrangements: Alone Available Help at Discharge: Friend(s);Available PRN/intermittently Type of Home: House Home Access: Stairs to enter Entrance Stairs-Rails: Left Entrance Stairs-Number of Steps: 3 Home Layout: One level Home Equipment: Shower seat - built in;Other (comment) (home O2) Additional Comments: home O2    Prior Function Level of Independence: Independent               Hand Dominance   Dominant Hand: Right    Extremity/Trunk Assessment               Lower Extremity Assessment: Generalized weakness         Communication   Communication: No difficulties  Cognition Arousal/Alertness: Awake/alert Behavior During Therapy: WFL for tasks assessed/performed Overall Cognitive Status: Impaired/Different from baseline Area of Impairment: Safety/judgement         Safety/Judgement: Decreased awareness of safety          General Comments General comments (skin integrity, edema, etc.): ambulated on 4 liters wit O2 dropping to 86%, required standing rest break and extended time to rebound. Cued for pused lip  breathing and nasal inhalation    Exercises     Assessment/Plan    PT Assessment Patient needs continued PT services  PT Problem List Decreased strength;Decreased activity tolerance;Decreased balance;Decreased mobility;Decreased safety awareness;Cardiopulmonary status limiting activity          PT Treatment Interventions DME instruction;Gait training;Stair training;Functional  mobility training;Therapeutic activities;Therapeutic exercise;Balance training;Patient/family education    PT Goals (Current goals can be found in the Care Plan section)  Acute Rehab PT Goals Patient Stated Goal: to go home PT Goal Formulation: With patient Time For Goal Achievement: 11/09/15 Potential to Achieve Goals: Good    Frequency Min 3X/week   Barriers to discharge Decreased caregiver support      Co-evaluation               End of Session Equipment Utilized During Treatment: Oxygen Activity Tolerance: Patient tolerated treatment well;Patient limited by fatigue Patient left: in chair;with call bell/phone within reach;with chair alarm set Nurse Communication: Mobility status    Functional Assessment Tool Used: clinical judgement Functional Limitation: Mobility: Walking and moving around Mobility: Walking and Moving Around Current Status [phone]([id number]): At least 20 percent but less than 40 percent impaired, limited or restricted Mobility: Walking and Moving Around Goal Status [phone](G8979): At least 1 percent but less than 20 percent impaired, limited or restricted    Time: 1351-1420 PT Time Calculation (min) (ACUTE ONLY): 29 min   Charges:   PT Evaluation $PT Eval Moderate Complexity: 1 Procedure     PT G Codes:   PT G-Codes **NOT FOR INPATIENT CLASS** Functional Assessment Tool Used: clinical judgement Functional Limitation: Mobility: Walking and moving around Mobility: Walking and Moving Around Current Status ([phone]([id number]): At least 20 percent but less than 40 percent impaired, limited or restricted Mobility: Walking and Moving Around Goal Status [phone]([id number]): At least 1 percent but less than 20 percent impaired, limited or restricted    Fabio AsaWerner, Zyiere Rosemond J 10/26/2015, 2:33 PM Charlotte Crumbevon Diontae Route, PT DPT  [phone]

## 2015-10-27 DIAGNOSIS — J441 Chronic obstructive pulmonary disease with (acute) exacerbation: Secondary | ICD-10-CM | POA: Diagnosis not present

## 2015-10-27 LAB — CBC
HCT: 35.8 % — ABNORMAL LOW (ref 39.0–52.0)
Hemoglobin: 9.8 g/dL — ABNORMAL LOW (ref 13.0–17.0)
MCH: 25.1 pg — AB (ref 26.0–34.0)
MCHC: 27.4 g/dL — AB (ref 30.0–36.0)
MCV: 91.6 fL (ref 78.0–100.0)
PLATELETS: 312 10*3/uL (ref 150–400)
RBC: 3.91 MIL/uL — ABNORMAL LOW (ref 4.22–5.81)
RDW: 17.6 % — ABNORMAL HIGH (ref 11.5–15.5)
WBC: 8.6 10*3/uL (ref 4.0–10.5)

## 2015-10-27 LAB — BASIC METABOLIC PANEL
Anion gap: 10 (ref 5–15)
BUN: 14 mg/dL (ref 6–20)
CALCIUM: 9.5 mg/dL (ref 8.9–10.3)
CO2: 39 mmol/L — AB (ref 22–32)
CREATININE: 1.05 mg/dL (ref 0.61–1.24)
Chloride: 90 mmol/L — ABNORMAL LOW (ref 101–111)
GFR calc Af Amer: >60 mL/min (ref >60)
GFR calc non Af Amer: >60 mL/min (ref >60)
GLUCOSE: 103 mg/dL — AB (ref 65–99)
Potassium: 3.4 mmol/L — ABNORMAL LOW (ref 3.5–5.1)
Sodium: 139 mmol/L (ref 135–145)

## 2015-10-27 LAB — FOLATE RBC
FOLATE, HEMOLYSATE: 597.5 ng/mL
Folate, RBC: 1959 ng/mL (ref >498)
HEMATOCRIT: 30.5 % — AB (ref 37.5–51.0)

## 2015-10-27 MED ORDER — PREDNISONE 20 MG PO TABS: 40.0000 mg | ORAL_TABLET | Freq: Every day | ORAL | 0 refills | Status: DC

## 2015-10-27 MED ORDER — FUROSEMIDE 40 MG PO TABS: 40.0000 mg | ORAL_TABLET | Freq: Two times a day (BID) | ORAL | 0 refills | Status: DC

## 2015-10-27 MED ORDER — LEVOFLOXACIN 750 MG PO TABS: 750.0000 mg | ORAL_TABLET | Freq: Every day | ORAL | 0 refills | Status: DC

## 2015-10-27 MED ORDER — ALPRAZOLAM 0.5 MG PO TABS: 0.2500 mg | ORAL_TABLET | Freq: Four times a day (QID) | ORAL | 0 refills | Status: DC | PRN

## 2015-10-27 MED ORDER — POTASSIUM CHLORIDE CRYS ER 20 MEQ PO TBCR
40.0000 meq | EXTENDED_RELEASE_TABLET | Freq: Once | ORAL | Status: AC
  Administered 2015-10-27: 40 meq via ORAL
  Filled 2015-10-27: qty 2

## 2015-10-27 MED ORDER — ATORVASTATIN CALCIUM 40 MG PO TABS: 40.0000 mg | ORAL_TABLET | Freq: Every day | ORAL | 0 refills | Status: AC

## 2015-10-27 MED ORDER — ASPIRIN 81 MG PO TBEC: 81.0000 mg | DELAYED_RELEASE_TABLET | Freq: Every day | ORAL | 0 refills | Status: AC

## 2015-10-27 NOTE — Progress Notes (Signed)
Patient was discharged home with home health by MD order; discharged instructions  review and give to patient and his daughter with care notes; IV DIC (foam dressing placed); patient will be escorted to the car by nurse tech via wheelchair.

## 2015-10-27 NOTE — Progress Notes (Signed)
Physical Therapy Treatment Patient Details Name: [name] [name]. MRN: [id number] DOB: 03-05-1940 Today's Date: 10/27/2015    History of Present Illness 75 y.o. male presenting with generalized weakness and shortness of breath of 1 day duration . PMH is significant for COPD (on 2-3L O2 at home), HTN, HFpEF, hx DVT    PT Comments    Patient continues to demonstrated decreased activity tolerance. Discussed energy conservation and activity progression as well as self monitoring for rest breaks. Current plan remains appropriate.   Follow Up Recommendations  Supervision - Intermittent;Home health PT     Equipment Recommendations  Other (comment) (4 wheeled rollator walker with seat )    Recommendations for Other Services       Precautions / Restrictions Precautions Precautions: Fall Precaution Comments: O2 dependent Restrictions Weight Bearing Restrictions: No    Mobility  Bed Mobility Overal bed mobility: Modified Independent Bed Mobility: Supine to Sit;Sit to Supine           General bed mobility comments: increased time and effort to perform  Transfers Overall transfer level: Needs assistance Equipment used: Rolling walker (2 wheeled) Transfers: Sit to/from Stand Sit to Stand: Supervision         General transfer comment: cues for safe hand placement and use of AD  Ambulation/Gait Ambulation/Gait assistance: Supervision Ambulation Distance (Feet): 220 Feet Assistive device: Rolling walker (2 wheeled) Gait Pattern/deviations: Step-through pattern;Decreased stride length;Trunk flexed Gait velocity: decreased   General Gait Details: one standing and one seated rest break; 4L O2 for ambulation after desat to lower 80%s; cues for pursed lip breathing and SpO2 back up to 90%   Stairs Stairs: Yes Stairs assistance: Min guard Stair Management: One rail Left;Forwards;Step to pattern Number of Stairs: 4 General stair comments: educated on energy conservation and  cues for safety and pursed lip breathing  Wheelchair Mobility    Modified Rankin (Stroke Patients Only)       Balance Overall balance assessment: History of Falls                                  Cognition Arousal/Alertness: Awake/alert Behavior During Therapy: WFL for tasks assessed/performed Overall Cognitive Status: No family/caregiver present to determine baseline cognitive functioning Area of Impairment: Safety/judgement                    Exercises      General Comments General comments (skin integrity, edema, etc.): discussed activity progression, energy conservation and self monitoring for rest breaks, and use of rollator      Pertinent Vitals/Pain Pain Assessment: No/denies pain    Home Living                      Prior Function            PT Goals (current goals can now be found in the care plan section) Acute Rehab PT Goals Patient Stated Goal: to go home and be more active PT Goal Formulation: With patient Time For Goal Achievement: 11/09/15 Potential to Achieve Goals: Good Progress towards PT goals: Progressing toward goals    Frequency    Min 3X/week      PT Plan Current plan remains appropriate    Co-evaluation             End of Session Equipment Utilized During Treatment: Oxygen;Gait belt Activity Tolerance: Patient tolerated treatment well Patient left: with call bell/phone  within reach;in bed;with bed alarm set     Time: [id number] PT Time Calculation (min) (ACUTE ONLY): 38 min  Charges:  $Gait Training: 23-37 mins $Therapeutic Activity: 8-22 mins                    G Codes:      Derek MoundKellyn R Benjiman Sedgwick Zeplin Aleshire, PTA Pager: [phone]   10/27/2015, 2:43 PM

## 2015-10-27 NOTE — Progress Notes (Signed)
CM received consult: From home alone - frequent falls - refused HH last admission. CM spoke with patient regarding safety issues/home health services and pt continues to decline any home health services. States he will just fine @ home and his daughter n law will ensure that he is ok. Rolator (rolling walker with a seat ) will be delivered to pt's room prior to d/c. Referral made [facility]/ [place] @ [phone]. Gae GallopAngela Ruqayyah Lute RN,BSN,CM

## 2015-10-27 NOTE — Discharge Instructions (Signed)
Mr. Gwen PoundsLashley, you were admitted for a COPD exacerbation. We gave you oxygen and lasix which seemed to have helped. You are now at baseline and stable to go. Below is information on your discharge:  I have scheduled a follow up appointment with [facility] pulmonology on 11/04/15 at 9:30 AM.  Please schedule follow up appointment with your PCP upon discharge within 1 week. They will receive information on your hospitalization.  Do not miss your lasix as this may have caused you event. We have increase your dose to 40 mg twice per day. Take this and adjust by your PCP recommendations. Do not take your HCTZ until you see your PCP.  We DECREASED your xanax to 0.25 mg. I know this helps but you must keep the dose low as increasing may lead to future falls and worsening of you health.  Please complete the antibiotic Levaquin and your steroid prednisone starting 9/21-9/22.  You should have received your walker before discharge.

## 2015-10-27 NOTE — Progress Notes (Signed)
Family Medicine Teaching Service Daily Progress Note Intern Pager: [phone]  Patient name: [name] [name]. Medical record number: [id number] Date of birth: 18-Apr-1940 Age: 75 y.o. 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 is a 75 y.o. male presenting with generalized weakness and shortness of breath of 1 day duration . PMH is significant for COPD (on 2-3L O2 at home), HTN, HFpEF, hx DVT.  1. Shortness of breath: Likely COPD exacerbation with component of CHF exacerbation. Has had acute shortness of breath with generalized weakness of 1 day duration. Patient states h/o productive cough for the past week. Ongoing issue with gradual worsening of symptoms over the years. Was seeing Dr. Delford Field 1.5 years ago but he retired with no visit on record since. Patient states he was intubated "many years ago" in the ICU for an exacerbation but cannot remember when and there are none on file. H/o DVT in 1990 but not currently on prophylaxis. On 3 L [place] in ED (home O2 dose). CHF is a consideration, as he has significant pitting edema almost to the knee bilaterally. He also has crackles in his lung bases. BNP mildly elevated at 111, increased from 30, 58, and 98 at previous hospitalizations. ACS is also on the differential, given his constant, central chest pressure; however, EKG does not show any signs of ischemia/infarct and I-stat troponin was 0.04. PE is also a consideration, given his history of DVT, but his legs are equally edematous without warmth, he does not have tachycardia, and his Wells score is 1.5. Pneumonia is unlikely without fever, focal lung findings, or a focal infiltrate on CXR.  - On telemetry  under observation status for COPD exacerbation - Continue O2 supplementation to maintain O2 sats >88%, with goal of weaning to home dose 2-3 L - Continue Levaquin PO for 5 days (9/18-9/22) - Prednisone 40 mg daily (9/18-9/22) - Duonebs tid with Albuterol q4hrs prn  - Given Lasix 80 mg IV x1, with -1.1 L UO total 9/19 - Daily weights - Strict I/Os - Ruled out ACS as described below - Xanax 0.25 BID PRN for anxiety (have decreased from his home dose of 0.5mg  q6hrs prn) - Continuous pulse ox - Continue trending CBC in AM - PT rec HH PT with 4 wheeled rollator walker with seat, no OT rec, pt not agreeable - Pt needs pulmonary follow-up prior to discharge, as he has not been seen by pulm in 1.5 years after his previous doctor retired - Can consider increasing Dulera dose from 236mcg-5mcg to 452mcg-10mcg on discharge  - Continue home Dulera and Spiriva started AM of 9/19  2. Hypokalemia: resolved K of 2.7>3.6. Received 2 runs of KCl IV and x1 K-Dur 40 mg in ED. Unclear etiology but may be secondary to taking Lasix and HCTZ at home. Has not had diarrhea. No further K supplement since admission. - Continue trending BMET in AM - Replace K as needed  3. Chest Pain: pressure-like, onset with COPD exacerbation. ASCVD score 22%, rec mod-high intensity statin. No h/o statin use. Troponin continues to be elevated at 0.04 (h/o mildly elevated troponin in past 0.04 on 7/17). EKG in ED showed unchanged NSR with occasional PVC from previous  admission with exception to being tachycardic. EKG 9/19 was consistent with previous but V6 difficult to interpret, artifact vs improper lead placement, will reorder. - Will start aspirin 81 mg daily this hospitalization - Cardiac monitoring - Continue trending troponin q6h - Atorvastatin 40 mg   4. AKI: resolved, Cr 1.33>1.10. Baseline 0.7-0.9. May be secondary to decreased intake vs third spacing. Pt denies vomiting, diarrhea, or decreased appetite. - Continue  trending BMET in AM - Avoid nephrotoxic meds - Holding home HCTZ - Hold off on IVFs with concern for possible CHF exacerbation - Given Lasix 80 mg IV x1, with -1.1 L UO total 9/19  5. H/o Falls: patient states this has occurred twice this week but was due to a stool he was on that was unstable. Denies h/o trauma or LOC. Pelvic Xray neg for acute fractures or dislocation. Pt lives alone and completes ADLs independently. - Fall precaution - Up with assistance - See PT rec above, no OT - Lumbar xray 9/19 showed T12 prominent compression fracture new since lumbar CT in 7/17  6. HTN: normotensive at 108-136/68-85, on Lasix 40 mg daily and HCTZ 25 mg daily at home. - Holding home HCTZ  7. HFpEF: ECHO on 08/20/2015 showed LV EF: 55% to 60% with G1DD and normal wall motion. - Cardiac monitoring - Plan as above  8. Anemia: Hgb 9.3>9.2 since admission with previous levels at 10's (7/17). Last iron studies on 3/17, showed low ferritin 17, low iron 15, normal TIBC 386, normal folate 15.8, low B12 151 (1299 7 years ago), normal retic 1.3. - Ferritin low at 15, iron 28, sat ratio low 8, normal TIBC, normal B12, folate pending - Consider iron supplement for iron def anemia outpt upon f/u with PCP  FEN/GI: heart healthy, miralax  Prophylaxis: Lovenox  Disposition: respiratory status at baseline, pt refusing HH PT, anticipate home to d/c  Subjective:  Afebrile with stable vitals overnight.States breathing is much improved. States he is at baseline with no complaints. Says he thinks his hospitalization may be due to missing his fluid pills prior to admission. Responded well to lasix overnight with good UO. Says he is ready for home.  Objective: Temp:  [98.8 F (37.1 C)-99.5 F (37.5 C)] 99.5 F (37.5 C) (09/20 0607) Pulse Rate:  [79-102] 79 (09/20 0607) Resp:  [18] 18 (09/20 0607) BP: (121-147)/(66-78) 147/78 (09/20 0607) SpO2:  [94 %-99 %] 99 % (09/20 0607) Weight:  [169 lb 12.1 oz (77 kg)]  169 lb 12.1 oz (77 kg) (09/20 0423) Physical Exam: General: well nourished, well developed, in no acute distress with non-toxic appearance HEENT: normocephalic, atraumatic, moist mucous membranes Neck: supple, non-tender without lymphadenopathy CV: regular rate and rhythm without murmurs, rubs, or gallops Lungs: clear to auscultation bilaterally with normal work of breathing on 3 L [place] Abdomen: soft, non-tender, no masses or organomegaly palpable, normoactive bowel sounds Skin: warm, dry, no rashes or lesions, cap refill < 2 seconds Extremities: warm and well perfused, normal tone Back: mildly tender at lower right lumbar sparing central or left lumbar, no ecchymosis or rash appreciated  Laboratory:  Recent Labs Lab 10/25/15 1022 10/25/15 2132 10/26/15 0347  WBC 9.2 5.9 5.9  HGB 10.2* 9.3* 9.2*  HCT 35.6* 33.5* 33.2*  PLT 282 256 264    Recent Labs Lab 10/25/15 1022 10/25/15 2132 10/26/15 0347  NA 137 135 136  K 2.7* 3.7 3.6  CL 89* 92* 90*  CO2 36* 34* 36*  BUN 16 14 13   CREATININE  1.33* 1.22 1.10  CALCIUM 8.9 8.3* 8.5*  GLUCOSE 95 196* 168*   BNP:  111 (previous 57.7 on 4/17) UA:  Neg Troponin:  0.04>0.03>0.04 (h/o mild elevation 0.04 7/17)  Imaging/Diagnostic Tests: CXR: 10/25/15 FINDINGS: Cardiac shadow is within normal limits. Diffuse interstitial changes are again identified but stable from the prior exam consistent with underlying scarring. No focal infiltrate or sizable effusion is seen. Compression deformities are again noted in the mid and lower thoracic spine. Some of these have progressed in the interval from the prior exam. Correlation with point tenderness is recommended.  IMPRESSION: Chronic interstitial changes. Compression deformities of the thoracic spine which have progressed in the interval from the prior exam. Correlation to point tenderness is recommended.  Xray of Pelvis: 10/25/15 FINDINGS: No fracture or dislocation is  seen. Bilateral hip joint spaces are preserved. Visualized bony pelvis appears intact. Mild degenerative changes of the lower lumbar spine.  IMPRESSION: No fracture or dislocation is seen.  DG Lumbar Spine 2-3 Views: 10/26/15 IMPRESSION: 1. T12 prominent compression fracture noted. This is new from CT of 08/19/2015. T9 compression fracture cannot be excluded. 2. Diffuse osteopenia degenerative change. 3. Moderate gastric distention.   Wendee Beavers, DO 10/27/2015, 7:22 AM PGY-1, [facility] Family Medicine FPTS Intern pager: [phone], text pages welcome

## 2015-10-30 NOTE — Discharge Summary (Signed)
Family Medicine Teaching [facility] Discharge Summary  Patient name: [name] Morningddie Fasching Jr. Medical record number: [id number] Date of birth: 01/20/1941 Age: 75 y.o. Gender: male Date of Admission: 10/25/2015  Date of Discharge: 10/27/15 Admitting Physician: Nestor RampSara L Neal, MD  Primary Care Provider: Egbert GaribaldiMillsaps, KIMBERLY M, NP Consultants: None  Indication for Hospitalization: COPD Exacerbation  Discharge Diagnoses/Problem List:  COPD with O2 Requirment T12 Compression Fracture HFpEF HTN H/o Falls Anemia  Disposition: Home, refused HH PT  Discharge Condition: Stable, improved  Discharge Exam:  General: well nourished, well developed, in no acute distress with non-toxic appearance HEENT: normocephalic, atraumatic, moist mucous membranes Neck: supple, non-tender without lymphadenopathy CV: regular rate and rhythm without murmurs, rubs, or gallops Lungs: clear to auscultation bilaterally with normal work of breathing on 3 L [place] Abdomen: soft, non-tender, no masses or organomegaly palpable, normoactive bowel sounds Skin: warm, dry, no rashes or lesions, cap refill < 2 seconds Extremities: warm and well perfused, normal tone Back: mildly tender at lower right lumbar sparing central or left lumbar, no ecchymosis or rash appreciated  Brief Hospital Course:  [name] Hillis a 75 y.o.male who presented with generalized weakness and shortness of breath of 1 day duration. PMH is significant for COPD (on 2-3L O2 at home), HTN, HFpEF, hx DVT.  Patient presented to ED on 9/18 with acute SOB and generalized weakness in the context of what appeared to be COPD vs CHF exacerbation. He also was noted to have 2 unwitnessed falls. PE was consider based upon history of DVT but was unlikely given a low Wells score. Troponin was neg and EKG did not show signs of acute infarct. PNA was unlikely given lack of leukocytosis and being afebrile. BNP was elevated above baseline at 111 and he did appear to be in  fluid overload. He did have an increased oxygen requirement but was weaned to 3 L [place] at his baseline. Patient was given 5 days of Levaquin and a prednisone burst 9/18-9/22. He was diuresed with IV Lasix and had appropriate response. Xray of pelvis was neg but lumbar spine showed newly diagnosed T12 compression fracture. Patient was able to ambulate without pain and was stable upon discharge. PT provided patient a new walker and rec HH PT but patient refused. Patient previously saw pulmonologist 1.5 years ago but not since, so pulm f/u was scheduled upon d/c. It was recommended to decrease xanax to 0.25 mg to prevent future falls.  Issues for Follow Up:  1. COPD exacerbation f/u s/p 5 days abx with steroids 2. Make sure patient sees pulmonology f/u with [place] on 9/28 3. Increased Lasix to 40 mg BID, adjust as needed, and consider restarting HCTZ 4. Decrease Xanax use to 0.25 due to h/o falls 5. Rec anemia workup, iron studies support iron def anemia, B12 and folate normal, consider iron supplement, no h/o rectal bleeding  Significant Procedures: None  Significant Labs and Imaging:   Recent Labs Lab 10/25/15 2132 10/26/15 0347 10/26/15 0854 10/27/15 0614  WBC 5.9 5.9  --  8.6  HGB 9.3* 9.2*  --  9.8*  HCT 33.5* 33.2* 30.5* 35.8*  PLT 256 264  --  312    Recent Labs Lab 10/25/15 1022 10/25/15 2132 10/26/15 0347 10/27/15 0614  NA 137 135 136 139  K 2.7* 3.7 3.6 3.4*  CL 89* 92* 90* 90*  CO2 36* 34* 36* 39*  GLUCOSE 95 196* 168* 103*  BUN 16 14 13 14   CREATININE 1.33* 1.22 1.10 1.05  CALCIUM  8.9 8.3* 8.5* 9.5   BNP:  111 (previous 57.7 on 4/17) UA:  Neg Troponin:  0.04>0.03>0.04 (h/o mild elevation 0.04 7/17)  CXR:10/25/15 FINDINGS: Cardiac shadow is within normal limits. Diffuse interstitial changes are again identified but stable from the prior exam consistent with underlying scarring. No focal infiltrate or sizable effusion is seen. Compression deformities are again  noted in the mid and lower thoracic spine. Some of these have progressed in the interval from the prior exam. Correlation with point tenderness is recommended.  IMPRESSION: Chronic interstitial changes. Compression deformities of the thoracic spine which have progressed in the interval from the prior exam. Correlation to point tenderness is recommended.  Xray of Pelvis:10/25/15 FINDINGS: No fracture or dislocation is seen. Bilateral hip joint spaces are preserved. Visualized bony pelvis appears intact. Mild degenerative changes of the lower lumbar spine.  IMPRESSION: No fracture or dislocation is seen.  DG Lumbar Spine 2-3 Views: 10/26/15 IMPRESSION: 1. T12 prominent compression fracture noted. This is new from CT of 08/19/2015. T9 compression fracture cannot be excluded. 2. Diffuse osteopenia degenerative change. 3. Moderate gastric distention.  Results/Tests Pending at Time of Discharge: None  Discharge Medications:    Medication List    STOP taking these medications   BAYER BACK & BODY PO   GOODY HEADACHE PO   hydrochlorothiazide 25 MG tablet Commonly known as:  HYDRODIURIL   oxyCODONE-acetaminophen 5-325 MG tablet Commonly known as:  PERCOCET/ROXICET     TAKE these medications   albuterol 0.63 MG/3ML nebulizer solution Commonly known as:  ACCUNEB Take 1 ampule by nebulization every 6 (six) hours as needed for wheezing.   albuterol 108 (90 Base) MCG/ACT inhaler Commonly known as:  PROVENTIL HFA;VENTOLIN HFA Inhale 2 puffs into the lungs every 6 (six) hours as needed for wheezing or shortness of breath.   ALPRAZolam 0.5 MG tablet Commonly known as:  XANAX Take 0.5 tablets (0.25 mg total) by mouth 4 (four) times daily as needed for anxiety. What changed:  how much to take   ANTACID PO Take 1 tablet by mouth every 4 (four) hours as needed (for acid reflux or heartburn).   aspirin 81 MG EC tablet Take 1 tablet (81 mg total) by mouth daily.    atorvastatin 40 MG tablet Commonly known as:  LIPITOR Take 1 tablet (40 mg total) by mouth daily at 6 PM.   dextromethorphan-guaiFENesin 30-600 MG 12hr tablet Commonly known as:  MUCINEX DM Take 1 tablet by mouth 2 (two) times daily.   furosemide 40 MG tablet Commonly known as:  LASIX Take 1 tablet (40 mg total) by mouth 2 (two) times daily. What changed:  when to take this   ipratropium 0.02 % nebulizer solution Commonly known as:  ATROVENT Inhale 0.5 mg into the lungs every 8 (eight) hours as needed for wheezing or shortness of breath.   ipratropium-albuterol 0.5-2.5 (3) MG/3ML Soln Commonly known as:  DUONEB Take 3 mLs by nebulization every 6 (six) hours as needed. What changed:  reasons to take this   IRON CR PO Take 100 mg by mouth 2 (two) times a week.   levofloxacin 750 MG tablet Commonly known as:  LEVAQUIN Take 1 tablet (750 mg total) by mouth daily.   mometasone-formoterol 200-5 MCG/ACT Aero Commonly known as:  DULERA Inhale 2 puffs into the lungs 2 (two) times daily.   ondansetron 8 MG tablet Commonly known as:  ZOFRAN Take 8 mg by mouth every 8 (eight) hours as needed for nausea or vomiting.  pantoprazole 40 MG tablet Commonly known as:  PROTONIX Take 1 tablet (40 mg total) by mouth 2 (two) times daily.   polyethylene glycol packet Commonly known as:  MIRALAX / GLYCOLAX Take 17 g by mouth daily as needed for mild constipation.   potassium chloride SA 20 MEQ tablet Commonly known as:  K-DUR,KLOR-CON Take 2 tablets (40 mEq total) by mouth daily.   predniSONE 20 MG tablet Commonly known as:  DELTASONE Take 2 tablets (40 mg total) by mouth daily with breakfast.   sodium chloride 0.65 % Soln nasal spray Commonly known as:  OCEAN Place 1 spray into the nose as needed for congestion (Dry nasal passages).   SPIRIVA HANDIHALER 18 MCG inhalation capsule Generic drug:  tiotropium Place 1 puff into inhaler and inhale daily as needed for wheezing.    vitamin E 100 UNIT capsule Take 100 Units by mouth daily as needed. supplement       Discharge Instructions: Please refer to Patient Instructions section of EMR for full details.  Patient was counseled important signs and symptoms that should prompt return to medical care, changes in medications, dietary instructions, activity restrictions, and follow up appointments.   Follow-Up Appointments: Follow-up Information    Millsaps, Joelene Millin, NP. Call on 10/27/2015.   Why:  Please call and schedule hospital follow up in 1 week Contact information: [facility] [address] [place] [address] [phone]        [facility]. Go on 11/04/2015.   Specialty:  Pulmonology Why:  Appointment at 9:30 AM Contact information: [address] [place] [address] [phone]          Wendee Beavers, DO 10/30/2015, 10:15 AM PGY-1, [facility] Family [facility]

## 2015-11-03 ENCOUNTER — Observation Stay (HOSPITAL_BASED_OUTPATIENT_CLINIC_OR_DEPARTMENT_OTHER)
Admission: EM | Admit: 2015-11-03 | Discharge: 2015-11-06 | Disposition: A | Discharge status: Skilled Nursing Facility | Payer: Medicare Other | Source: Home / Self Care | Attending: Emergency Medicine | Admitting: Emergency Medicine

## 2015-11-03 ENCOUNTER — Emergency Department (HOSPITAL_COMMUNITY): Payer: Medicare Other

## 2015-11-03 ENCOUNTER — Encounter (HOSPITAL_COMMUNITY): Payer: Self-pay

## 2015-11-03 DIAGNOSIS — E785 Hyperlipidemia, unspecified: Secondary | ICD-10-CM

## 2015-11-03 DIAGNOSIS — R140 Abdominal distension (gaseous): Secondary | ICD-10-CM | POA: Insufficient documentation

## 2015-11-03 DIAGNOSIS — R110 Nausea: Secondary | ICD-10-CM | POA: Insufficient documentation

## 2015-11-03 DIAGNOSIS — Z820 Family history of epilepsy and other diseases of the nervous system: Secondary | ICD-10-CM

## 2015-11-03 DIAGNOSIS — I110 Hypertensive heart disease with heart failure: Secondary | ICD-10-CM | POA: Insufficient documentation

## 2015-11-03 DIAGNOSIS — Z87891 Personal history of nicotine dependence: Secondary | ICD-10-CM

## 2015-11-03 DIAGNOSIS — Z888 Allergy status to other drugs, medicaments and biological substances status: Secondary | ICD-10-CM

## 2015-11-03 DIAGNOSIS — Z885 Allergy status to narcotic agent status: Secondary | ICD-10-CM | POA: Insufficient documentation

## 2015-11-03 DIAGNOSIS — J189 Pneumonia, unspecified organism: Secondary | ICD-10-CM | POA: Diagnosis not present

## 2015-11-03 DIAGNOSIS — I493 Ventricular premature depolarization: Secondary | ICD-10-CM | POA: Insufficient documentation

## 2015-11-03 DIAGNOSIS — R296 Repeated falls: Secondary | ICD-10-CM

## 2015-11-03 DIAGNOSIS — I5032 Chronic diastolic (congestive) heart failure: Secondary | ICD-10-CM

## 2015-11-03 DIAGNOSIS — R531 Weakness: Secondary | ICD-10-CM

## 2015-11-03 DIAGNOSIS — D509 Iron deficiency anemia, unspecified: Secondary | ICD-10-CM | POA: Insufficient documentation

## 2015-11-03 DIAGNOSIS — I700 Atherosclerosis of aorta: Secondary | ICD-10-CM | POA: Insufficient documentation

## 2015-11-03 DIAGNOSIS — R5383 Other fatigue: Secondary | ICD-10-CM

## 2015-11-03 DIAGNOSIS — Z720 Tobacco use: Secondary | ICD-10-CM | POA: Diagnosis present

## 2015-11-03 DIAGNOSIS — R918 Other nonspecific abnormal finding of lung field: Secondary | ICD-10-CM | POA: Insufficient documentation

## 2015-11-03 DIAGNOSIS — M8580 Other specified disorders of bone density and structure, unspecified site: Secondary | ICD-10-CM | POA: Insufficient documentation

## 2015-11-03 DIAGNOSIS — J449 Chronic obstructive pulmonary disease, unspecified: Secondary | ICD-10-CM | POA: Diagnosis present

## 2015-11-03 DIAGNOSIS — R000 Tachycardia, unspecified: Secondary | ICD-10-CM | POA: Insufficient documentation

## 2015-11-03 DIAGNOSIS — Z7982 Long term (current) use of aspirin: Secondary | ICD-10-CM | POA: Insufficient documentation

## 2015-11-03 DIAGNOSIS — N179 Acute kidney failure, unspecified: Secondary | ICD-10-CM | POA: Diagnosis not present

## 2015-11-03 DIAGNOSIS — Z880 Allergy status to penicillin: Secondary | ICD-10-CM

## 2015-11-03 DIAGNOSIS — J4489 Other specified chronic obstructive pulmonary disease: Secondary | ICD-10-CM | POA: Diagnosis present

## 2015-11-03 DIAGNOSIS — Z79899 Other long term (current) drug therapy: Secondary | ICD-10-CM | POA: Insufficient documentation

## 2015-11-03 DIAGNOSIS — R0602 Shortness of breath: Secondary | ICD-10-CM

## 2015-11-03 DIAGNOSIS — G43909 Migraine, unspecified, not intractable, without status migrainosus: Secondary | ICD-10-CM | POA: Insufficient documentation

## 2015-11-03 DIAGNOSIS — I10 Essential (primary) hypertension: Secondary | ICD-10-CM | POA: Diagnosis not present

## 2015-11-03 DIAGNOSIS — Z9981 Dependence on supplemental oxygen: Secondary | ICD-10-CM

## 2015-11-03 DIAGNOSIS — K219 Gastro-esophageal reflux disease without esophagitis: Secondary | ICD-10-CM | POA: Diagnosis present

## 2015-11-03 DIAGNOSIS — E876 Hypokalemia: Secondary | ICD-10-CM

## 2015-11-03 DIAGNOSIS — Z803 Family history of malignant neoplasm of breast: Secondary | ICD-10-CM

## 2015-11-03 DIAGNOSIS — E871 Hypo-osmolality and hyponatremia: Secondary | ICD-10-CM | POA: Insufficient documentation

## 2015-11-03 DIAGNOSIS — J441 Chronic obstructive pulmonary disease with (acute) exacerbation: Secondary | ICD-10-CM

## 2015-11-03 DIAGNOSIS — R0989 Other specified symptoms and signs involving the circulatory and respiratory systems: Secondary | ICD-10-CM | POA: Insufficient documentation

## 2015-11-03 DIAGNOSIS — Z86718 Personal history of other venous thrombosis and embolism: Secondary | ICD-10-CM

## 2015-11-03 DIAGNOSIS — Z91048 Other nonmedicinal substance allergy status: Secondary | ICD-10-CM | POA: Insufficient documentation

## 2015-11-03 DIAGNOSIS — Z7951 Long term (current) use of inhaled steroids: Secondary | ICD-10-CM

## 2015-11-03 LAB — COMPREHENSIVE METABOLIC PANEL
ALBUMIN: 3.8 g/dL (ref 3.5–5.0)
ALK PHOS: 84 U/L (ref 38–126)
ALT: 18 U/L (ref 17–63)
ANION GAP: 13 (ref 5–15)
AST: 27 U/L (ref 15–41)
BILIRUBIN TOTAL: 0.5 mg/dL (ref 0.3–1.2)
BUN: 39 mg/dL — ABNORMAL HIGH (ref 6–20)
CALCIUM: 9.8 mg/dL (ref 8.9–10.3)
CO2: 42 mmol/L — ABNORMAL HIGH (ref 22–32)
CREATININE: 1.57 mg/dL — AB (ref 0.61–1.24)
Chloride: 79 mmol/L — ABNORMAL LOW (ref 101–111)
GFR calc non Af Amer: 42 mL/min — ABNORMAL LOW (ref >60)
GFR, EST AFRICAN AMERICAN: 48 mL/min — AB (ref >60)
Glucose, Bld: 126 mg/dL — ABNORMAL HIGH (ref 65–99)
Potassium: 2.3 mmol/L — CL (ref 3.5–5.1)
SODIUM: 134 mmol/L — AB (ref 135–145)
TOTAL PROTEIN: 7 g/dL (ref 6.5–8.1)

## 2015-11-03 LAB — CBC WITH DIFFERENTIAL/PLATELET
BASOS ABS: 0 10*3/uL (ref 0.0–0.1)
BASOS PCT: 0 %
EOS ABS: 0 10*3/uL (ref 0.0–0.7)
Eosinophils Relative: 0 %
HEMATOCRIT: 36.7 % — AB (ref 39.0–52.0)
HEMOGLOBIN: 10.7 g/dL — AB (ref 13.0–17.0)
Lymphocytes Relative: 6 %
Lymphs Abs: 0.7 10*3/uL (ref 0.7–4.0)
MCH: 25.4 pg — ABNORMAL LOW (ref 26.0–34.0)
MCHC: 29.2 g/dL — ABNORMAL LOW (ref 30.0–36.0)
MCV: 87 fL (ref 78.0–100.0)
MONO ABS: 0.9 10*3/uL (ref 0.1–1.0)
Monocytes Relative: 9 %
NEUTROS ABS: 8.9 10*3/uL — AB (ref 1.7–7.7)
NEUTROS PCT: 85 %
Platelets: 352 10*3/uL (ref 150–400)
RBC: 4.22 MIL/uL (ref 4.22–5.81)
RDW: 17.1 % — AB (ref 11.5–15.5)
WBC: 10.5 10*3/uL (ref 4.0–10.5)

## 2015-11-03 LAB — TROPONIN I
TROPONIN I: 0.06 ng/mL — AB (ref <0.03)
Troponin I: 0.06 ng/mL (ref <0.03)

## 2015-11-03 LAB — PHOSPHORUS: PHOSPHORUS: 1.5 mg/dL — AB (ref 2.5–4.6)

## 2015-11-03 LAB — MAGNESIUM: MAGNESIUM: 2.4 mg/dL (ref 1.7–2.4)

## 2015-11-03 LAB — BRAIN NATRIURETIC PEPTIDE: B NATRIURETIC PEPTIDE 5: 162.9 pg/mL — AB (ref 0.0–100.0)

## 2015-11-03 MED ORDER — SODIUM CHLORIDE 0.9 % IV SOLN
INTRAVENOUS | Status: AC
  Administered 2015-11-03: 16:00:00 via INTRAVENOUS

## 2015-11-03 MED ORDER — TRAMADOL HCL 50 MG PO TABS
50.0000 mg | ORAL_TABLET | Freq: Four times a day (QID) | ORAL | Status: DC | PRN
  Administered 2015-11-03 – 2015-11-06 (×6): 50 mg via ORAL
  Filled 2015-11-03 (×6): qty 1

## 2015-11-03 MED ORDER — IPRATROPIUM-ALBUTEROL 0.5-2.5 (3) MG/3ML IN SOLN
3.0000 mL | Freq: Four times a day (QID) | RESPIRATORY_TRACT | Status: DC | PRN
  Administered 2015-11-05: 3 mL via RESPIRATORY_TRACT
  Filled 2015-11-03: qty 3

## 2015-11-03 MED ORDER — ACETAMINOPHEN 650 MG RE SUPP: 650.0000 mg | Freq: Four times a day (QID) | RECTAL | Status: DC | PRN

## 2015-11-03 MED ORDER — PANTOPRAZOLE SODIUM 40 MG PO TBEC
40.0000 mg | DELAYED_RELEASE_TABLET | Freq: Two times a day (BID) | ORAL | Status: DC
  Administered 2015-11-03 – 2015-11-06 (×6): 40 mg via ORAL
  Filled 2015-11-03 (×6): qty 1

## 2015-11-03 MED ORDER — TIOTROPIUM BROMIDE MONOHYDRATE 18 MCG IN CAPS
18.0000 ug | ORAL_CAPSULE | Freq: Every day | RESPIRATORY_TRACT | Status: DC
  Administered 2015-11-04 – 2015-11-05 (×2): 18 ug via RESPIRATORY_TRACT
  Filled 2015-11-03: qty 5

## 2015-11-03 MED ORDER — ASPIRIN EC 81 MG PO TBEC
81.0000 mg | DELAYED_RELEASE_TABLET | Freq: Every day | ORAL | Status: DC
  Administered 2015-11-03 – 2015-11-06 (×4): 81 mg via ORAL
  Filled 2015-11-03 (×4): qty 1

## 2015-11-03 MED ORDER — ALPRAZOLAM 0.5 MG PO TABS
0.5000 mg | ORAL_TABLET | Freq: Three times a day (TID) | ORAL | Status: DC | PRN
  Administered 2015-11-03 – 2015-11-05 (×6): 0.5 mg via ORAL
  Filled 2015-11-03 (×6): qty 1

## 2015-11-03 MED ORDER — ORAL CARE MOUTH RINSE
15.0000 mL | Freq: Two times a day (BID) | OROMUCOSAL | Status: DC
  Administered 2015-11-04 – 2015-11-05 (×4): 15 mL via OROMUCOSAL

## 2015-11-03 MED ORDER — ATORVASTATIN CALCIUM 40 MG PO TABS
40.0000 mg | ORAL_TABLET | Freq: Every day | ORAL | Status: DC
  Administered 2015-11-03 – 2015-11-05 (×3): 40 mg via ORAL
  Filled 2015-11-03 (×3): qty 1

## 2015-11-03 MED ORDER — SALINE SPRAY 0.65 % NA SOLN
1.0000 | NASAL | Status: DC | PRN
  Filled 2015-11-03: qty 44

## 2015-11-03 MED ORDER — ONDANSETRON HCL 4 MG/2ML IJ SOLN: 4.0000 mg | Freq: Four times a day (QID) | INTRAMUSCULAR | Status: DC | PRN

## 2015-11-03 MED ORDER — DM-GUAIFENESIN ER 30-600 MG PO TB12: 1.0000 | ORAL_TABLET | Freq: Two times a day (BID) | ORAL | Status: DC | PRN

## 2015-11-03 MED ORDER — ONDANSETRON HCL 4 MG/2ML IJ SOLN
4.0000 mg | Freq: Once | INTRAMUSCULAR | Status: AC
  Administered 2015-11-03: 4 mg via INTRAVENOUS
  Filled 2015-11-03: qty 2

## 2015-11-03 MED ORDER — SODIUM CHLORIDE 0.9 % IV BOLUS (SEPSIS)
500.0000 mL | Freq: Once | INTRAVENOUS | Status: AC
  Administered 2015-11-03: 500 mL via INTRAVENOUS

## 2015-11-03 MED ORDER — POLYETHYLENE GLYCOL 3350 17 G PO PACK: 17.0000 g | PACK | Freq: Every day | ORAL | Status: DC | PRN

## 2015-11-03 MED ORDER — TIOTROPIUM BROMIDE MONOHYDRATE 18 MCG IN CAPS
18.0000 ug | ORAL_CAPSULE | Freq: Every day | RESPIRATORY_TRACT | Status: DC
  Filled 2015-11-03: qty 5

## 2015-11-03 MED ORDER — ACETAMINOPHEN 325 MG PO TABS
650.0000 mg | ORAL_TABLET | Freq: Four times a day (QID) | ORAL | Status: DC | PRN
Start: 2015-11-03 — End: 2015-11-06
  Administered 2015-11-03 – 2015-11-06 (×4): 650 mg via ORAL
  Filled 2015-11-03 (×4): qty 2

## 2015-11-03 MED ORDER — MOMETASONE FURO-FORMOTEROL FUM 200-5 MCG/ACT IN AERO
2.0000 | INHALATION_SPRAY | Freq: Two times a day (BID) | RESPIRATORY_TRACT | Status: DC
  Administered 2015-11-03 – 2015-11-05 (×5): 2 via RESPIRATORY_TRACT
  Filled 2015-11-03: qty 8.8

## 2015-11-03 MED ORDER — SODIUM CHLORIDE 0.9 % IV BOLUS (SEPSIS)
1000.0000 mL | Freq: Once | INTRAVENOUS | Status: AC
  Administered 2015-11-03: 1000 mL via INTRAVENOUS

## 2015-11-03 MED ORDER — POTASSIUM CHLORIDE CRYS ER 20 MEQ PO TBCR
40.0000 meq | EXTENDED_RELEASE_TABLET | Freq: Once | ORAL | Status: AC
  Administered 2015-11-03: 40 meq via ORAL
  Filled 2015-11-03: qty 2

## 2015-11-03 MED ORDER — ONDANSETRON HCL 4 MG PO TABS
4.0000 mg | ORAL_TABLET | Freq: Four times a day (QID) | ORAL | Status: DC | PRN
  Administered 2015-11-05: 4 mg via ORAL
  Filled 2015-11-03: qty 1

## 2015-11-03 MED ORDER — HEPARIN SODIUM (PORCINE) 5000 UNIT/ML IJ SOLN
5000.0000 [IU] | Freq: Three times a day (TID) | INTRAMUSCULAR | Status: DC
  Administered 2015-11-03 – 2015-11-06 (×8): 5000 [IU] via SUBCUTANEOUS
  Filled 2015-11-03 (×9): qty 1

## 2015-11-03 MED ORDER — FERROUS SULFATE 325 (65 FE) MG PO TABS
325.0000 mg | ORAL_TABLET | ORAL | Status: DC
  Administered 2015-11-03 – 2015-11-05 (×2): 325 mg via ORAL
  Filled 2015-11-03 (×2): qty 1

## 2015-11-03 MED ORDER — POTASSIUM CHLORIDE 10 MEQ/100ML IV SOLN
10.0000 meq | INTRAVENOUS | Status: AC
  Administered 2015-11-03 (×5): 10 meq via INTRAVENOUS
  Filled 2015-11-03 (×5): qty 100

## 2015-11-03 NOTE — ED Triage Notes (Signed)
[facility]  Here today with c/o generalized weakness and anorexia. He states he has fallen "a lot recently", however is unable to tell me his most recent fall and has no specific c/o of pain. He is oriented x 4 and capably uses his cell phone to receive and read a text from his daughter-in-law.

## 2015-11-03 NOTE — ED Notes (Signed)
MD at bedside. 

## 2015-11-03 NOTE — H&P (Signed)
History and Physical  [name] [name]. UJW:[id number] DOB: 02/11/1940 DOA: 11/03/2015  Referring physician: Jeraldine Loots, MD PCP: [name], NP   Chief Complaint: weakness  HPI: [name] [name]. is a 75 y.o. male with oxygen dependent COPD, chronic SOB, CHF, presents to the emergency department with generalized weakness for the past several days prior to admission. He reports severe fatigue and multiple falls at home. His family has been very concerned about him not eating and drinking well. He reports that he also has had anorexia and nausea but no fever vomiting or diarrhea. He reports that he has been taking his medications recently treated for a COPD exacerbation with Levaquin but reports he has difficulty taking potassium because the tablets are so large. He does use oxygen 24/7. He came to the ER because he was concerned about the progression of symptoms. In the ED he was noted to be weak and dehydrated with hypokalemia and hyponatremia. He was started on IV fluid hydration and admission was requested.  Review of Systems: All systems reviewed and apart from history of presenting illness, are negative.  Past Medical History:  Diagnosis Date  . Acute respiratory failure with hypoxia (HCC) 01/2014  . Chronic bronchitis    "get it q yr" (07/17/2013)  . Chronic diastolic CHF (congestive heart failure) (HCC)   . COPD (chronic obstructive pulmonary disease) (HCC)   . Daily headache   . DVT (deep venous thrombosis) (HCC) 1990's   "?LLE"  . GERD (gastroesophageal reflux disease)    "related to RX I take" (07/17/2013)  . Hypertension   . Migraine    "all my life; no really bad migraines in 8-9 since work stress is gone" (07/17/2013)  . On home oxygen therapy    "2L 24/7" (08/19/2015)  . Pneumonia    "several times"   Past Surgical History:  Procedure Laterality Date  . CATARACT EXTRACTION W/ INTRAOCULAR LENS IMPLANT Right 05/05/2015  . INGUINAL HERNIA REPAIR Left ~ 1990  .  TONSILLECTOMY  1949   Social History:  reports that he quit smoking about 17 years ago. His smoking use included Cigarettes. He has a 10.00 pack-year smoking history. He has never used smokeless tobacco. He reports that he does not drink alcohol or use drugs.   Allergies  Allergen Reactions  . Adhesive [Tape]     Tears skin, prefers paper tape  . Advair Diskus [Fluticasone-Salmeterol] Other (See Comments)    Blurred vision  . Brovana [Arformoterol]     Ineffective--exacerbated coughing/congestion  . Codeine Other (See Comments)    Hallucinations, blurred vision  . Ferrous Sulfate Other (See Comments)    Stomach pain  . Other Swelling    Bicillin injection in 1961--swelling in lower extremities and joints, and whelps up arm.  Has taken penicillin since w/o reaction.  . Prednisone Other (See Comments)    Stomach pain    Family History  Problem Relation Age of Onset  . Breast cancer Mother   . Alzheimer's disease Father    Prior to Admission medications   Medication Sig Start Date End Date Taking? Authorizing Provider  albuterol (ACCUNEB) 0.63 MG/3ML nebulizer solution Take 1 ampule by nebulization every 6 (six) hours as needed for wheezing.   Yes Historical Provider, MD  albuterol (PROVENTIL HFA;VENTOLIN HFA) 108 (90 BASE) MCG/ACT inhaler Inhale 2 puffs into the lungs every 6 (six) hours as needed for wheezing or shortness of breath. 04/13/14  Yes Storm Frisk, MD  ALPRAZolam Prudy Feeler) 0.5 MG  tablet Take 0.5 tablets (0.25 mg total) by mouth 4 (four) times daily as needed for anxiety. Patient taking differently: Take 0.5 mg by mouth 4 (four) times daily as needed for anxiety.  10/27/15  Yes Wendee Beaversavid J McMullen, DO  aspirin EC 81 MG EC tablet Take 1 tablet (81 mg total) by mouth daily. 10/28/15  Yes Wendee Beaversavid J McMullen, DO  atorvastatin (LIPITOR) 40 MG tablet Take 1 tablet (40 mg total) by mouth daily at 6 PM. 10/27/15  Yes Wendee Beaversavid J McMullen, DO  Calcium Carbonate Antacid (ANTACID PO) Take 1  tablet by mouth every 4 (four) hours as needed (for acid reflux or heartburn).    Yes Historical Provider, MD  dextromethorphan-guaiFENesin (MUCINEX DM) 30-600 MG 12hr tablet Take 1 tablet by mouth 2 (two) times daily. Patient taking differently: Take 1 tablet by mouth 2 (two) times daily as needed for cough.  05/10/15  Yes Ruben ImJeremy Ford, MD  Ferrous Sulfate (IRON) 142 (45 Fe) MG TBCR Take 1 tablet by mouth 3 (three) times a week.   Yes Historical Provider, MD  furosemide (LASIX) 40 MG tablet Take 1 tablet (40 mg total) by mouth 2 (two) times daily. 10/27/15  Yes Wendee Beaversavid J McMullen, DO  hydrochlorothiazide (HYDRODIURIL) 25 MG tablet Take 25 mg by mouth daily. 10/04/15  Yes Historical Provider, MD  ipratropium (ATROVENT) 0.02 % nebulizer solution Inhale 0.5 mg into the lungs every 8 (eight) hours as needed for wheezing or shortness of breath.  07/28/15  Yes Historical Provider, MD  ipratropium-albuterol (DUONEB) 0.5-2.5 (3) MG/3ML SOLN Take 3 mLs by nebulization every 6 (six) hours as needed. Patient taking differently: Take 3 mLs by nebulization every 6 (six) hours as needed (for wheezing or shortness of breath).  05/12/14  Yes Albertine GratesFang Xu, MD  levofloxacin (LEVAQUIN) 750 MG tablet Take 1 tablet (750 mg total) by mouth daily. 10/28/15  Yes Elmon Elseavid J McMullen, DO  mometasone-formoterol (DULERA) 200-5 MCG/ACT AERO Inhale 2 puffs into the lungs 2 (two) times daily.   Yes Historical Provider, MD  pantoprazole (PROTONIX) 40 MG tablet Take 1 tablet (40 mg total) by mouth 2 (two) times daily. 11/01/13  Yes Vassie Lollarlos Madera, MD  polyethylene glycol [facility](MIRALAX / GLYCOLAX) packet Take 17 g by mouth daily as needed for mild constipation. 08/23/15  Yes Zannie CovePreetha Joseph, MD  sodium chloride (OCEAN) 0.65 % SOLN nasal spray Place 1 spray into the nose as needed for congestion (Dry nasal passages).   Yes Historical Provider, MD  SPIRIVA HANDIHALER 18 MCG inhalation capsule Place 1 puff into inhaler and inhale daily as needed for wheezing.  09/07/15  Yes Historical Provider, MD  traMADol (ULTRAM) 50 MG tablet Take 50 mg by mouth daily as needed for moderate pain.  10/31/15  Yes Historical Provider, MD  vitamin E 100 UNIT capsule Take 100 Units by mouth daily as needed. supplement   Yes Historical Provider, MD  ondansetron (ZOFRAN) 8 MG tablet Take 8 mg by mouth every 8 (eight) hours as needed for nausea or vomiting.  08/19/15   Historical Provider, MD  potassium chloride SA (K-DUR,KLOR-CON) 20 MEQ tablet Take 2 tablets (40 mEq total) by mouth daily. Patient not taking: Reported on 11/03/2015 08/23/15   Zannie CovePreetha Joseph, MD  predniSONE (DELTASONE) 20 MG tablet Take 2 tablets (40 mg total) by mouth daily with breakfast. Patient not taking: Reported on 11/03/2015 10/28/15   Wendee Beaversavid J McMullen, DO   Physical Exam: Vitals:   11/03/15 0959 11/03/15 1030  BP: 127/62 133/75  Pulse: 97  100  Resp: 21 19  Temp: 98.4 F (36.9 C)   TempSrc: Oral   SpO2: 94% 96%     General exam: Moderately built and nourished patient, Chronically ill-appearing, poorly groomed, foul cat smell on skin and clothing.  Head, eyes and ENT: Nontraumatic and normocephalic. Pupils equally reacting to light and accommodation. Oral mucosa dry,pasty.  Neck: Supple. No JVD, carotid bruit or thyromegaly.  Lymphatics: No lymphadenopathy.  Respiratory system: poor air movement,diffuse basilar expiratory wheezes. No increased work of breathing.  Cardiovascular system: S1 and S2 heard, mild tachycardia. No JVD, murmurs, gallops, clicks or pedal edema.  Gastrointestinal system: Abdomen is obese, nondistended, soft and nontender. Normal bowel sounds heard. No organomegaly or masses appreciated.  Central nervous system: Alert and oriented. No focal neurological deficits.  Extremities: Symmetric 5 x 5 power. Peripheral pulses symmetrically felt.   Skin: No rashes or acute findings.  Musculoskeletal system: Negative exam.  Psychiatry: Pleasant and cooperative.   Labs on  Admission:  Basic Metabolic Panel:  Recent Labs Lab 11/03/15 1050  NA 134*  K 2.3*  CL 79*  CO2 42*  GLUCOSE 126*  BUN 39*  CREATININE 1.57*  CALCIUM 9.8   Liver Function Tests:  Recent Labs Lab 11/03/15 1050  AST 27  ALT 18  ALKPHOS 84  BILITOT 0.5  PROT 7.0  ALBUMIN 3.8   No results for input(s): LIPASE, AMYLASE in the last 168 hours. No results for input(s): AMMONIA in the last 168 hours. CBC:  Recent Labs Lab 11/03/15 1050  WBC 10.5  NEUTROABS 8.9*  HGB 10.7*  HCT 36.7*  MCV 87.0  PLT 352   Cardiac Enzymes:  Recent Labs Lab 11/03/15 1050  TROPONINI 0.06*    BNP (last 3 results) No results for input(s): PROBNP in the last 8760 hours. CBG: No results for input(s): GLUCAP in the last 168 hours.  Radiological Exams on Admission: Dg Chest 2 View  Result Date: 11/03/2015 CLINICAL DATA:  Shortness of breath.  Multiple recent falls EXAM: CHEST  2 VIEW COMPARISON:  October 25, 2015 and May 06, 2015 Findings coal Scarring is noted bilaterally, most notably in the right mid lung and left base regions. There is somewhat diffuse interstitial thickening which is stable. There is no frank edema or consolidation. Heart size and pulmonary vascularity are normal. There is atherosclerotic calcification in the aorta. No adenopathy. Anterior wedging of several vertebral bodies stable. There is mild new anterior wedging of the T6 vertebral body. IMPRESSION: Mild anterior wedging of the T6 vertebral body, new from most recent study. Anterior wedging of other thoracic vertebral bodies is stable. Anterior wedging is most marked at T12. There is chronic scarring and interstitial thickening, stable. No edema or consolidation. Stable cardiac silhouette. Electronically Signed   By: Bretta Bang III M.D.   On: 11/03/2015 11:50   EKG: Independently reviewed.  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 Dehydration with acute renal injury-prerenal azotemia will be treated with IV fluid hydration. Follow electrolytes. Encourage oral fluid consumption. Follow clinically. 2. COPD-oxygen requiring-continue oxygen nasal cannula, neb treatments ordered as needed, resume home respiratory medications. Does not appear to be in acute exacerbation at this time. 3. GERD-resume proton pump inhibitor therapy. 4.  Hypokalemia-secondary to diuretics-holding diuretics at this time secondary to dehydration, replace orally and IV. The patient was ordered to receive 6 runs of K IVPB. In addition, 40 mEq oral potassium ordered. Check magnesium. Follow electrolytes. 5. Tobacco abuse-Pt says he is not smoking at this time. 6. Hyponatremia secondary to dehydration-treating with IV normal saline, repeat BMP in the morning. 7. Generalized weakness-secondary to dehydration and hypokalemia. Expect some improvement with correcting electrolytes and hydrating. Consult PT/OT for evaluation and management recommendations.    DVT Prophylaxis: heparin, SCD Code Status: FULL  Family Communication: daughter at bedside  Disposition Plan: TBD   Standley Dakins, MD [facility] Pager [phone]  If 7PM-7AM, please contact night-coverage [email or web address] Password [email or web address] 11/03/2015, 1:17 PM

## 2015-11-03 NOTE — ED Provider Notes (Signed)
[facility]-EMERGENCY DEPT Provider Note   CSN: [id number] Arrival date & time: 11/03/15  4098     History   Chief Complaint Chief Complaint  Patient presents with  . Weakness    HPI [name] [name]. is a 75 y.o. male.  HPI Patient presents with concern of generalized weakness, dyspnea, fatigue, and multiple falls. Patient was hospitalized with ago, at our affiliated facility. He notes that since returning home he has had persistent symptoms, including anorexia, nausea, but no new fever, vomiting, diarrhea. Patient takes all medication as directed, including antibiotics prescribed on recent hospitalization. He acknowledges history of CHF, COPD He uses oxygen 24/7. Today he presents due to progression of his symptoms, but not with concern over fall, pain, confusion, disorientation.  Past Medical History:  Diagnosis Date  . Acute respiratory failure with hypoxia (HCC) 01/2014  . Chronic bronchitis    "get it q yr" (07/17/2013)  . Chronic diastolic CHF (congestive heart failure) (HCC)   . COPD (chronic obstructive pulmonary disease) (HCC)   . Daily headache   . DVT (deep venous thrombosis) (HCC) 1990's   "?LLE"  . GERD (gastroesophageal reflux disease)    "related to RX I take" (07/17/2013)  . Hypertension   . Migraine    "all my life; no really bad migraines in 8-9 since work stress is gone" (07/17/2013)  . On home oxygen therapy    "2L 24/7" (08/19/2015)  . Pneumonia    "several times"    Patient Active Problem List   Diagnosis Date Noted  . Falls   . Shortness of breath 10/25/2015  . Abdominal pain 08/19/2015  . Elevated troponin 08/19/2015  . Chest pressure 08/19/2015  . Diastolic CHF, acute on chronic (HCC)   . Troponin level elevated   . B12 deficiency 05/12/2015  . Iron deficiency anemia   . Abdominal distension   . COPD exacerbation (HCC) 05/06/2015  . Influenza B 05/06/2015  . Cataract of right eye 05/06/2015  . COPD (chronic obstructive pulmonary disease)  (HCC) 05/09/2014  . Acute on chronic respiratory failure with hypoxia (HCC) 05/09/2014  . Influenza A H1N1 infection 05/09/2014  . AKI (acute kidney injury) (HCC) 05/09/2014  . Lobar pneumonia due to unspecified organism 05/05/2014  . Tobacco abuse 01/19/2014  . History of DVT (deep vein thrombosis)   . On home oxygen therapy   . Hypokalemia 10/29/2013  . GERD 12/25/2008  . Essential hypertension 11/09/2008  . 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 CATARACT EXTRACTION W/ INTRAOCULAR LENS IMPLANT Right 05/05/2015  . INGUINAL HERNIA REPAIR Left ~ 1990  . TONSILLECTOMY  1949       Home Medications    Prior to Admission medications   Medication Sig Start Date End Date Taking? Authorizing Provider  albuterol (ACCUNEB) 0.63 MG/3ML nebulizer solution Take 1 ampule by nebulization every 6 (six) hours as needed for wheezing.    Historical Provider, MD  albuterol (PROVENTIL HFA;VENTOLIN HFA) 108 (90 BASE) MCG/ACT inhaler Inhale 2 puffs into the lungs every 6 (six) hours as needed for wheezing or shortness of breath. 04/13/14   Storm Frisk, MD  ALPRAZolam Prudy Feeler) 0.5 MG tablet Take 0.5 tablets (0.25 mg total) by mouth 4 (four) times daily as needed for anxiety. 10/27/15   Wendee Beavers, DO  aspirin EC 81 MG EC tablet Take 1 tablet (81 mg total) by mouth daily. 10/28/15   Wendee Beavers,  DO  atorvastatin (LIPITOR) 40 MG tablet Take 1 tablet (40 mg total) by mouth daily at 6 PM. 10/27/15   Wendee Beavers, DO  Calcium Carbonate Antacid (ANTACID PO) Take 1 tablet by mouth every 4 (four) hours as needed (for acid reflux or heartburn).     Historical Provider, MD  dextromethorphan-guaiFENesin (MUCINEX DM) 30-600 MG 12hr tablet Take 1 tablet by mouth 2 (two) times daily. 05/10/15   Ruben Im, MD  furosemide (LASIX) 40 MG tablet Take 1 tablet (40 mg total) by mouth 2 (two) times daily. 10/27/15    Wendee Beavers, DO  ipratropium (ATROVENT) 0.02 % nebulizer solution Inhale 0.5 mg into the lungs every 8 (eight) hours as needed for wheezing or shortness of breath.  07/28/15   Historical Provider, MD  ipratropium-albuterol (DUONEB) 0.5-2.5 (3) MG/3ML SOLN Take 3 mLs by nebulization every 6 (six) hours as needed. Patient taking differently: Take 3 mLs by nebulization every 6 (six) hours as needed (for wheezing or shortness of breath).  05/12/14   Albertine Grates, MD  IRON CR PO Take 100 mg by mouth 2 (two) times a week.     Historical Provider, MD  levofloxacin (LEVAQUIN) 750 MG tablet Take 1 tablet (750 mg total) by mouth daily. 10/28/15   Wendee Beavers, DO  mometasone-formoterol (DULERA) 200-5 MCG/ACT AERO Inhale 2 puffs into the lungs 2 (two) times daily.    Historical Provider, MD  ondansetron (ZOFRAN) 8 MG tablet Take 8 mg by mouth every 8 (eight) hours as needed for nausea or vomiting.  08/19/15   Historical Provider, MD  pantoprazole (PROTONIX) 40 MG tablet Take 1 tablet (40 mg total) by mouth 2 (two) times daily. 11/01/13   Vassie Loll, MD  polyethylene glycol [facility] / Ethelene Hal) packet Take 17 g by mouth daily as needed for mild constipation. 08/23/15   Zannie Cove, MD  potassium chloride SA (K-DUR,KLOR-CON) 20 MEQ tablet Take 2 tablets (40 mEq total) by mouth daily. 08/23/15   Zannie Cove, MD  predniSONE (DELTASONE) 20 MG tablet Take 2 tablets (40 mg total) by mouth daily with breakfast. 10/28/15   Wendee Beavers, DO  sodium chloride (OCEAN) 0.65 % SOLN nasal spray Place 1 spray into the nose as needed for congestion (Dry nasal passages).    Historical Provider, MD  SPIRIVA HANDIHALER 18 MCG inhalation capsule Place 1 puff into inhaler and inhale daily as needed for wheezing. 09/07/15   Historical Provider, MD  vitamin E 100 UNIT capsule Take 100 Units by mouth daily as needed. supplement    Historical Provider, MD    Family History Family History  Problem Relation Age of Onset  .  Breast cancer Mother   . Alzheimer's disease Father     Social History Social History  Substance Use Topics  . Smoking status: Former Smoker    Packs/day: 1.00    Years: 10.00    Types: Cigarettes    Quit date: 02/06/1998  . Smokeless tobacco: Never Used  . Alcohol use No     Allergies   Advair diskus [fluticasone-salmeterol]; Brovana [arformoterol]; Codeine; Ferrous sulfate; Other; and Prednisone   Review of Systems Review of Systems  Constitutional:       Per HPI, otherwise negative  HENT:       Per HPI, otherwise negative  Respiratory:       Per HPI, otherwise negative  Cardiovascular:       Per HPI, otherwise negative  Gastrointestinal: Positive for nausea. Negative for vomiting.  Endocrine:       Negative aside from HPI  Genitourinary:       Neg aside from HPI   Musculoskeletal:       Per HPI, otherwise negative  Skin: Negative.   Neurological: Positive for weakness. Negative for syncope.  Hematological: Bruises/bleeds easily.     Physical Exam Updated Vital Signs BP 133/75   Pulse 100   Temp 98.4 F (36.9 C) (Oral)   Resp 19   SpO2 96%   Physical Exam  Constitutional: He is oriented to person, place, and time. No distress.  Ill-appearing elderly male awake, alert, speaking clearly  HENT:  Head: Normocephalic and atraumatic.  Eyes: Conjunctivae and EOM are normal.  Cardiovascular: Normal rate and regular rhythm.   Pulmonary/Chest: No stridor. Tachypnea noted. He has decreased breath sounds. He has wheezes.  Abdominal: He exhibits no distension.  Musculoskeletal: He exhibits no edema.  Neurological: He is alert and oriented to person, place, and time.  Skin: Skin is warm and dry.  Psychiatric: He has a normal mood and affect.  Nursing note and vitals reviewed.    ED Treatments / Results  Labs (all labs ordered are listed, but only abnormal results are displayed) Labs Reviewed  COMPREHENSIVE METABOLIC PANEL - Abnormal; Notable for the  following:       Result Value   Sodium 134 (*)    Potassium 2.3 (*)    Chloride 79 (*)    CO2 42 (*)    Glucose, Bld 126 (*)    BUN 39 (*)    Creatinine, Ser 1.57 (*)    GFR calc non Af Amer 42 (*)    GFR calc Af Amer 48 (*)    All other components within normal limits  CBC WITH DIFFERENTIAL/PLATELET - Abnormal; Notable for the following:    Hemoglobin 10.7 (*)    HCT 36.7 (*)    MCH 25.4 (*)    MCHC 29.2 (*)    RDW 17.1 (*)    Neutro Abs 8.9 (*)    All other components within normal limits  TROPONIN I - Abnormal; Notable for the following:    Troponin I 0.06 (*)    All other components within normal limits  BRAIN NATRIURETIC PEPTIDE - Abnormal; Notable for the following:    B Natriuretic Peptide 162.9 (*)    All other components within normal limits    EKG  EKG Interpretation  Date/Time:  Wednesday November 03 2015 10:49:29 EDT Ventricular Rate:  82 PR Interval:    QRS Duration: 128 QT Interval:  346 QTC Calculation: 404 R Axis:   49 Text Interpretation:  Sinus tachycardia Multiform ventricular premature complexes IVCD, consider atypical RBBB Baseline wander in lead(s) V1 Artifact No significant change since last tracing Abnormal ekg Confirmed by Gerhard MunchLOCKWOOD, AmeLie Hollars  MD ([id number]) on 11/03/2015 11:29:51 AM     On oxygen patient has saturation 97%, 2 L, abnormal   Chart review notable for discharge one week ago, with the below working plan  Issues for Follow Up:  1. COPD exacerbation f/u s/p 5 days abx with steroids 2. Make sure patient sees pulmonology f/u with [facility] on 9/28 3. Increased Lasix to 40 mg BID, adjust as needed, and consider restarting HCTZ 4. Decrease Xanax use to 0.25 due to h/o falls 5. Rec anemia workup, iron studies support iron def anemia, B12 and folate normal, consider iron supplement, no h/o rectal bleeding   Radiology Dg Chest 2 View  Result Date: 11/03/2015 CLINICAL DATA:  Shortness of breath.  Multiple recent falls EXAM: CHEST  2 VIEW  COMPARISON:  October 25, 2015 and May 06, 2015 Findings coal Scarring is noted bilaterally, most notably in the right mid lung and left base regions. There is somewhat diffuse interstitial thickening which is stable. There is no frank edema or consolidation. Heart size and pulmonary vascularity are normal. There is atherosclerotic calcification in the aorta. No adenopathy. Anterior wedging of several vertebral bodies stable. There is mild new anterior wedging of the T6 vertebral body. IMPRESSION: Mild anterior wedging of the T6 vertebral body, new from most recent study. Anterior wedging of other thoracic vertebral bodies is stable. Anterior wedging is most marked at T12. There is chronic scarring and interstitial thickening, stable. No edema or consolidation. Stable cardiac silhouette. Electronically Signed   By: Bretta Bang III M.D.   On: 11/03/2015 11:50    Procedures Procedures (including critical care time)  Medications Ordered in ED Medications  sodium chloride 0.9 % bolus 1,000 mL (not administered)  potassium chloride SA (K-DUR,KLOR-CON) CR tablet 40 mEq (not administered)  potassium chloride 10 mEq in 100 mL IVPB (not administered)  sodium chloride 0.9 % bolus 500 mL (0 mLs Intravenous Stopped 11/03/15 1158)     Initial Impression / Assessment and Plan / ED Course  I have reviewed the triage vital signs and the nursing notes.  Pertinent labs & imaging results that were available during my care of the patient were reviewed by me and considered in my medical decision making (see chart for details).  Clinical Course    Initial labs notable for elevated creatinine, hypokalemia. Both new since discharge one week ago. He received fluids empirically, now will continue to receive additional fluid resuscitation, oral and IV potassium supplementation.    Final Clinical Impressions(s) / ED Diagnoses  Patient with multiple medical issues including COPD, CHF presents one week after  recent hospital duration, now with concern for ongoing weakness. History the patient is an acute kidney injury, hypokalemia. Patient required both IV and oral supplementation of his potassium. Patient was admitted for further evaluation and management.   Gerhard Munch, MD 11/03/15 1229

## 2015-11-03 NOTE — ED Notes (Signed)
Bed: WA03 Expected date:  Expected time:  Means of arrival:  Comments: EMS-weakness 

## 2015-11-04 ENCOUNTER — Observation Stay (HOSPITAL_COMMUNITY): Payer: Medicare Other

## 2015-11-04 ENCOUNTER — Inpatient Hospital Stay: Payer: Medicare Other | Admitting: Acute Care

## 2015-11-04 DIAGNOSIS — R531 Weakness: Secondary | ICD-10-CM

## 2015-11-04 DIAGNOSIS — D509 Iron deficiency anemia, unspecified: Secondary | ICD-10-CM | POA: Diagnosis not present

## 2015-11-04 DIAGNOSIS — E785 Hyperlipidemia, unspecified: Secondary | ICD-10-CM | POA: Diagnosis not present

## 2015-11-04 DIAGNOSIS — I5032 Chronic diastolic (congestive) heart failure: Secondary | ICD-10-CM | POA: Diagnosis not present

## 2015-11-04 DIAGNOSIS — I10 Essential (primary) hypertension: Secondary | ICD-10-CM | POA: Diagnosis not present

## 2015-11-04 DIAGNOSIS — E876 Hypokalemia: Secondary | ICD-10-CM | POA: Diagnosis not present

## 2015-11-04 DIAGNOSIS — J449 Chronic obstructive pulmonary disease, unspecified: Secondary | ICD-10-CM | POA: Diagnosis not present

## 2015-11-04 LAB — CBC WITH DIFFERENTIAL/PLATELET
Basophils Absolute: 0 10*3/uL (ref 0.0–0.1)
Basophils Relative: 0 %
EOS ABS: 0.1 10*3/uL (ref 0.0–0.7)
Eosinophils Relative: 1 %
HEMATOCRIT: 30.7 % — AB (ref 39.0–52.0)
HEMOGLOBIN: 9.1 g/dL — AB (ref 13.0–17.0)
LYMPHS ABS: 0.7 10*3/uL (ref 0.7–4.0)
Lymphocytes Relative: 9 %
MCH: 25.2 pg — AB (ref 26.0–34.0)
MCHC: 29.6 g/dL — AB (ref 30.0–36.0)
MCV: 85 fL (ref 78.0–100.0)
MONOS PCT: 7 %
Monocytes Absolute: 0.5 10*3/uL (ref 0.1–1.0)
NEUTROS ABS: 6.7 10*3/uL (ref 1.7–7.7)
NEUTROS PCT: 83 %
Platelets: 302 10*3/uL (ref 150–400)
RBC: 3.61 MIL/uL — AB (ref 4.22–5.81)
RDW: 17.1 % — ABNORMAL HIGH (ref 11.5–15.5)
WBC: 8.1 10*3/uL (ref 4.0–10.5)

## 2015-11-04 LAB — TROPONIN I
Troponin I: 0.04 ng/mL (ref <0.03)
Troponin I: 0.05 ng/mL (ref <0.03)

## 2015-11-04 LAB — BASIC METABOLIC PANEL
ANION GAP: 7 (ref 5–15)
BUN: 19 mg/dL (ref 6–20)
CHLORIDE: 90 mmol/L — AB (ref 101–111)
CO2: 38 mmol/L — AB (ref 22–32)
CREATININE: 0.9 mg/dL (ref 0.61–1.24)
Calcium: 8.5 mg/dL — ABNORMAL LOW (ref 8.9–10.3)
GFR calc non Af Amer: >60 mL/min (ref >60)
Glucose, Bld: 85 mg/dL (ref 65–99)
POTASSIUM: 2.8 mmol/L — AB (ref 3.5–5.1)
SODIUM: 135 mmol/L (ref 135–145)

## 2015-11-04 MED ORDER — POTASSIUM CHLORIDE CRYS ER 20 MEQ PO TBCR
40.0000 meq | EXTENDED_RELEASE_TABLET | Freq: Once | ORAL | Status: AC
  Administered 2015-11-04: 40 meq via ORAL
  Filled 2015-11-04: qty 2

## 2015-11-04 NOTE — Care Management Obs Status (Signed)
MEDICARE OBSERVATION STATUS NOTIFICATION   Patient Details  Name: [name] Morningddie Cappelli Jr. MRN: [id number] Date of Birth: 09-Mar-1940   Medicare Observation Status Notification Given:  Yes    Geni BersMcGibboney, Raequan Vanschaick, RN 11/04/2015, 1:42 PM

## 2015-11-04 NOTE — Progress Notes (Addendum)
PT Cancellation Note  Patient Details Name: [name] Morningddie Walls Jr. MRN: [id number] DOB: 1940/09/28   Cancelled Treatment:    Reason Eval/Treat Not Completed: Attempted PT eval. Pt declined to participate at this time. Will check back as schedule allows. Thanks.  2nd attempt for PT eval this afternoon-pt declined to participate. Will try again on tomorrow.   Rebeca AlertJannie Mahogony Gilchrest, MPT Pager: [phone]

## 2015-11-04 NOTE — Progress Notes (Signed)
OT Cancellation Note  Patient Details Name: [name] Morningddie Siefert Jr. MRN: [id number] DOB: 10/26/1940   Cancelled Treatment:    Reason Eval/Treat Not Completed: Attempted OT eval. Pt declined to participate at this time, wanted to rest/sleep. Will check back as schedule allows. Thank you.  Jazarah Capili A 11/04/2015, 11:08 AM

## 2015-11-04 NOTE — Progress Notes (Signed)
LCSW consulted for SNF and possible placement. Patient seen on 9/20 admission by CM and pt refused home health. Patient is refusing at this time to participate with therapy per notes. In order for patient to be referred to SNF, he must be participating and have a skilled need due to insurance authorization.  LCSW will follow acutely and assist with needs.  Deretha EmoryHannah Dominique Calvey LCSW, MSW Clinical Social Work: Optician, dispensingystem Wide [employer] Coverage for :  [employer]  [phone]

## 2015-11-04 NOTE — Progress Notes (Signed)
Patient ID: [name] [name]., male   DOB: 09-Jun-1940, 75 y.o.   MRN: [id number]  PROGRESS NOTE    [name] [name].  ZDG:[id number] DOB: 12-Dec-1940 DOA: 11/03/2015  PCP: [name], NP   Brief Narrative:  75 -year-old male with past medical history significant for COPD, oxygen dependent on 3 L nasal cannula oxygen support at home, tobacco use. Patient presented to [facility] with weakness and fatigue over past few days prior to this admission. Patient also not eating very well. Patient was recently in hospital for COPD exacerbation, discharged 10/27/2015.  On admission, patient was hemodynamically stable. Blood work was notable for hemoglobin of 10.7, potassium 2.3, creatinine 1.57 and troponin level 0.06. He was admitted for further evaluation.   Assessment & Plan:   Active Problems: Generalized weakness and fatigue - Unclear etiology, possibly COPD - Obtain physical therapy evaluation  Hypokalemia - Patient is on Lasix at home which likely has contributed to low potassium - Potassium supplemented - Follow-up BMP tomorrow [name]   COPD - Stable respiratory status - Continue Spiriva and Dulera   Iron deficiency anemia - Continue iron supplementation  Dyslipidemia - Continue atorvastatin 40 mg at bedtime   DVT prophylaxis: Heparin subcutaneous Code Status: full code  Family Communication: No family at the bedside Disposition Plan: Needs physical therapy evaluation for safe discharge plan; this pt is teaching service Fam med pt so will call them and transfer pt to their service    Consultants:   None   Procedures:   None   Antimicrobials:   None  Subjective: No overnight events.  Objective: Vitals:   11/03/15 2057 11/03/15 2243 11/04/15 0537 11/04/15 0915  BP: 120/76  128/80   Pulse: 88  83   Resp: 18  18   Temp: 98 F (36.7 C)  98.9 F (37.2 C)   TempSrc: Oral  Oral   SpO2: 97% 95% 97% 96%  Weight:   74.8 kg (164 lb 14.4 oz)     Height:        Intake/Output Summary (Last 24 hours) at 11/04/15 1045 Last data filed at 11/04/15 0942  Gross per 24 hour  Intake           2877.5 ml  Output              800 ml  Net           2077.5 ml   Filed Weights   11/03/15 1518 11/04/15 0537  Weight: 72.8 kg (160 lb 6.4 oz) 74.8 kg (164 lb 14.4 oz)    Examination:  General exam: Appears calm and comfortable  Respiratory system: Coarse breath sounds, no wheezing  Cardiovascular system: S1 & S2 heard, Rate controlled  Gastrointestinal system: Abdomen is nondistended, soft and nontender. No organomegaly or masses felt. Normal bowel sounds heard. Central nervous system: No focal neurological deficits. Extremities: Symmetric 5 x 5 power. No LE swelling  Skin: UE Ecchymosis Psychiatry: Judgement and insight appear normal. Mood & affect appropriate.   Data Reviewed: I have personally reviewed following labs and imaging studies  CBC:  Recent Labs Lab 11/03/15 1050 11/04/15 0456  WBC 10.5 8.1  NEUTROABS 8.9* 6.7  HGB 10.7* 9.1*  HCT 36.7* 30.7*  MCV 87.0 85.0  PLT 352 302   Basic Metabolic Panel:  Recent Labs Lab 11/03/15 1050 11/03/15 1722 11/04/15 0456  NA 134*  --  135  K 2.3*  --  2.8*  CL 79*  --  90*  CO2 42*  --  38*  GLUCOSE 126*  --  85  BUN 39*  --  19  CREATININE 1.57*  --  0.90  CALCIUM 9.8  --  8.5*  MG  --  2.4  --   PHOS  --  1.5*  --    GFR: Estimated Creatinine Clearance: 69.7 mL/min (by C-G formula based on SCr of 0.9 mg/dL). Liver Function Tests:  Recent Labs Lab 11/03/15 1050  AST 27  ALT 18  ALKPHOS 84  BILITOT 0.5  PROT 7.0  ALBUMIN 3.8   No results for input(s): LIPASE, AMYLASE in the last 168 hours. No results for input(s): AMMONIA in the last 168 hours. Coagulation Profile: No results for input(s): INR, PROTIME in the last 168 hours. Cardiac Enzymes:  Recent Labs Lab 11/03/15 1050 11/03/15 1722 11/03/15 2349 11/04/15 0456  TROPONINI 0.06* 0.06* 0.04* 0.05*    BNP (last 3 results) No results for input(s): PROBNP in the last 8760 hours. HbA1C: No results for input(s): HGBA1C in the last 72 hours. CBG: No results for input(s): GLUCAP in the last 168 hours. Lipid Profile: No results for input(s): CHOL, HDL, LDLCALC, TRIG, CHOLHDL, LDLDIRECT in the last 72 hours. Thyroid Function Tests: No results for input(s): TSH, T4TOTAL, FREET4, T3FREE, THYROIDAB in the last 72 hours. Anemia Panel: No results for input(s): VITAMINB12, FOLATE, FERRITIN, TIBC, IRON, RETICCTPCT in the last 72 hours. Urine analysis:    Component Value Date/Time   COLORURINE YELLOW 10/25/2015 1210   APPEARANCEUR CLEAR 10/25/2015 1210   LABSPEC 1.013 10/25/2015 1210   PHURINE 7.5 10/25/2015 1210   GLUCOSEU NEGATIVE 10/25/2015 1210   HGBUR NEGATIVE 10/25/2015 1210   BILIRUBINUR NEGATIVE 10/25/2015 1210   KETONESUR NEGATIVE 10/25/2015 1210   PROTEINUR NEGATIVE 10/25/2015 1210   UROBILINOGEN 0.2 10/29/2013 2011   NITRITE NEGATIVE 10/25/2015 1210   LEUKOCYTESUR NEGATIVE 10/25/2015 1210   Sepsis Labs: @LABRCNTIP (procalcitonin:4,lacticidven:4)   )No results found for this or any previous visit (from the past 240 hour(s)).    Radiology Studies: Dg Chest 2 View  Result Date: 11/03/2015 CLINICAL DATA:  Shortness of breath.  Multiple recent falls EXAM: CHEST  2 VIEW COMPARISON:  October 25, 2015 and May 06, 2015 Findings coal Scarring is noted bilaterally, most notably in the right mid lung and left base regions. There is somewhat diffuse interstitial thickening which is stable. There is no frank edema or consolidation. Heart size and pulmonary vascularity are normal. There is atherosclerotic calcification in the aorta. No adenopathy. Anterior wedging of several vertebral bodies stable. There is mild new anterior wedging of the T6 vertebral body. IMPRESSION: Mild anterior wedging of the T6 vertebral body, new from most recent study. Anterior wedging of other thoracic  vertebral bodies is stable. Anterior wedging is most marked at T12. There is chronic scarring and interstitial thickening, stable. No edema or consolidation. Stable cardiac silhouette. Electronically Signed   By: Bretta BangWilliam  Woodruff III M.D.   On: 11/03/2015 11:50   Portable Chest 1 View  Result Date: 11/04/2015 CLINICAL DATA:  Weakness and shortness of breath EXAM: PORTABLE CHEST 1 VIEW COMPARISON:  PA and lateral chest x-ray of November 03, 2015 FINDINGS: The lungs are adequately inflated. The interstitial markings are increased bilaterally. There is no alveolar infiltrate or pleural effusion. The cardiac silhouette is mildly enlarged though stable. The pulmonary vascularity is prominent centrally. There are bibasilar densities consistent with atelectasis or early infiltrate. IMPRESSION: COPD with pulmonary fibrotic changes. Superimposed bibasilar atelectasis or pneumonia. Mild pulmonary vascular  congestion suggests low-grade CHF. Electronically Signed   By: David  [place] M.D.   On: 11/04/2015 07:21     Scheduled Meds: . sodium chloride   Intravenous STAT  . aspirin EC  81 mg Oral Daily  . atorvastatin  40 mg Oral q1800  . ferrous sulfate  325 mg Oral Once per day on Mon Wed Fri  . heparin  5,000 Units Subcutaneous Q8H  . mouth rinse  15 mL Mouth Rinse BID  . mometasone-formoterol  2 puff Inhalation BID  . pantoprazole  40 mg Oral BID  . tiotropium  18 mcg Inhalation Daily   Continuous Infusions:    LOS: 0 days    Time spent: 15 minutes  Greater than 50% of the time spent on counseling and coordinating the care.   Manson Passey, MD [facility] Pager [phone]  If 7PM-7AM, please contact night-coverage [email or web address] Password [email or web address] 11/04/2015, 10:45 AM

## 2015-11-05 DIAGNOSIS — D509 Iron deficiency anemia, unspecified: Secondary | ICD-10-CM | POA: Diagnosis not present

## 2015-11-05 DIAGNOSIS — E785 Hyperlipidemia, unspecified: Secondary | ICD-10-CM | POA: Diagnosis not present

## 2015-11-05 DIAGNOSIS — E876 Hypokalemia: Secondary | ICD-10-CM | POA: Diagnosis not present

## 2015-11-05 LAB — BASIC METABOLIC PANEL
Anion gap: 9 (ref 5–15)
BUN: 14 mg/dL (ref 6–20)
CALCIUM: 8.9 mg/dL (ref 8.9–10.3)
CO2: 34 mmol/L — AB (ref 22–32)
Chloride: 93 mmol/L — ABNORMAL LOW (ref 101–111)
Creatinine, Ser: 0.68 mg/dL (ref 0.61–1.24)
GFR calc Af Amer: >60 mL/min (ref >60)
GLUCOSE: 94 mg/dL (ref 65–99)
Potassium: 3.1 mmol/L — ABNORMAL LOW (ref 3.5–5.1)
Sodium: 136 mmol/L (ref 135–145)

## 2015-11-05 LAB — MAGNESIUM: Magnesium: 1.8 mg/dL (ref 1.7–2.4)

## 2015-11-05 MED ORDER — POTASSIUM CHLORIDE CRYS ER 20 MEQ PO TBCR
40.0000 meq | EXTENDED_RELEASE_TABLET | Freq: Once | ORAL | Status: AC
  Administered 2015-11-05: 40 meq via ORAL
  Filled 2015-11-05: qty 2

## 2015-11-05 NOTE — Clinical Social Work Note (Signed)
Clinical Social Work Assessment  Patient Details  Name: [name] [name]. MRN: [id number] Date of Birth: 28-Apr-1940  Date of referral:  11/05/15               Reason for consult:  Facility Placement                Permission sought to share information with:  Case Manager, Customer service manager, Family Supports Permission granted to share information::  Yes, Verbal Permission Granted  Name::        Agency::  SNF in [place]  Relationship::  daughter in Financial trader Information:     Housing/Transportation Living arrangements for the past 2 months:  [place] of Information:  Patient, Medical Team, Case Manager Patient Interpreter Needed:  None Criminal Activity/Legal Involvement Pertinent to Current Situation/Hospitalization:  No - Comment as needed Significant Relationships:  Other Family Members, Adult Children Lives with:  Self Do you feel safe going back to the place where you live?  No Need for family participation in patient care:  Yes (Comment)  Care giving concerns:  Patient reports he is not doing well today and struggling at home to care for himself. He lives alone and is able to typically complete his ADLs, but in the last month he has slowly declined. He struggles getting to the bathroom on his own. Patient reports he has some family to help, but not 24/7.   Social Worker assessment / plan:  LCSW met with patient at the bedside, explained role and services while in hospital.  Explained different venues of care for discharge and specifically nursing home placement in which he was receptive too.  He reports he has never been to a facility in the past and has no preference.  He is able to make his own decisions but does give permission to speak with family.  He is educated about the process and LCSW will follow up with bed offers. Patient agreeable and began breathing treatment so LCSW excused herself. MD notified of disposition along with  CM.  Employment status:  Retired Nurse, adult PT Recommendations:  [place] / Referral to community resources:  [place]  Patient/Family's Response to care:  Agreeable to plan  Patient/Family's Understanding of and Emotional Response to Diagnosis, Current Treatment, and Prognosis:  Patient very self aware of his limitations in adequately taking care of himself at home safely.  He is agreeable to SNF.  Emotional Assessment Appearance:  Appears stated age Attitude/Demeanor/Rapport:  Other (calm and cooperative) Affect (typically observed):  Accepting, Adaptable Orientation:  Oriented to Self, Oriented to Place, Oriented to  Time, Oriented to Situation Alcohol / Substance use:  Not Applicable Psych involvement (Current and /or in the community):  No (Comment)  Discharge Needs  Concerns to be addressed:  No discharge needs identified Readmission within the last 30 days:  Yes Current discharge risk:  None Barriers to Discharge:  Continued Medical Work up   [place]illy [place], LCSW 11/05/2015, 9:41 AM

## 2015-11-05 NOTE — Progress Notes (Signed)
Occupational Therapy Evaluation Patient Details Name: [name] [name]. MRN: [id number] DOB: [birth year] Today's Date: 11/05/2015    History of Present Illness 75 -year-old male with past medical history significant for COPD, oxygen dependent on 3 L nasal cannula oxygen support at home, tobacco use and admitted for generalized weakness and faigue.  Pt with recent admission for COPD exacerbation   Clinical Impression   Patient presents to OT with decreased ADL independence and safety due to the deficits listed below. He will benefit from skilled OT to maximize function and to facilitate discharge to the venue listed below. OT will follow.    Follow Up Recommendations  SNF    Equipment Recommendations  Other (comment) (tbd next venue of care)    Recommendations for Other Services       Precautions / Restrictions Precautions Precautions: Fall Precaution Comments: O2 dependent Restrictions Weight Bearing Restrictions: No      Mobility Bed Mobility Overal bed mobility: Needs Assistance Bed Mobility: Supine to Sit;Sit to Supine     Supine to sit: Supervision;HOB elevated Sit to supine: Supervision;HOB elevated   General bed mobility comments: increased time and effort due to flank pain  Transfers Overall transfer level: Needs assistance Equipment used: Rolling walker (2 wheeled) Transfers: Sit to/from Stand Sit to Stand: Min assist;+2 safety/equipment         General transfer comment: verbal cues for safe technique, SpO2 dropped to 84% on 3L O2 with only standing (also reports SOB and dizziness), increased to 4L O2 and performed pursed lip breathing with cues and SPO2 up to 90% prior to initiating ambulation; BP upon standing 122/74 mmHg    Balance Overall balance assessment: History of Falls (3 falls in the past month)                                          ADL Overall ADL's : Needs assistance/impaired Eating/Feeding: Set up;Bed level                Upper Body Dressing : Sitting;Moderate assistance   Lower Body Dressing: Maximal assistance;Sit to/from stand   Toilet Transfer: Minimal Chartered loss adjuster Details (indicate cue type and reason): setup to use urinal         Functional mobility during ADLs: Minimal assistance;+2 for safety/equipment;Rolling walker General ADL Comments: Educated on pursed lip breathing. O2 sats drop with all mobility. Also pt c/o L flank/back pain from falls. O2 dropped to 80s on 4 L O2, back up to 90s after seated for several minutes focusing on pursed lip breathing.      Vision     Perception     Praxis      Pertinent Vitals/Pain Pain Assessment: Faces Pain Score:  (pt doesn't rate numerically) Faces Pain Scale: Hurts whole lot Pain Location: L flank/back (daughter states bruising from falls) Pain Descriptors / Indicators: Grimacing;Sore Pain Intervention(s): Limited activity within patient's tolerance;Monitored during session (daughter to call RN for meds)     Hand Dominance Right   Extremity/Trunk Assessment Upper Extremity Assessment Upper Extremity Assessment: Generalized weakness   Lower Extremity Assessment Lower Extremity Assessment: Defer to PT evaluation       Communication Communication Communication: No difficulties   Cognition Arousal/Alertness: Awake/alert Behavior During Therapy: WFL for tasks assessed/performed   Area of Impairment: Safety/judgement         Safety/Judgement: Decreased awareness of safety;Decreased awareness of  deficits     General Comments: pt with poor judgement (requiring more assist, not using RW) however daughter reports this is baseline   General Comments       Exercises       Shoulder Instructions      Home Living Family/patient expects to be discharged to:: Private residence Living Arrangements: Alone Available Help at Discharge: Family;Available PRN/intermittently Type of Home:  House Home Access: Stairs to enter Entergy CorporationEntrance Stairs-Number of Steps: 3 Entrance Stairs-Rails: Left Home Layout: One level     Bathroom Shower/Tub: Producer, television/film/videoWalk-in shower   Bathroom Toilet: Standard     Home Equipment: Emergency planning/management officerhower seat;Walker - 4 wheels;Other (comment)   Additional Comments: 3L home O2      Prior Functioning/Environment Level of Independence: Independent        Comments: daughter reports pt does not use RW in home due to space (she is suggestive of hoarding), typically has daughter checking in and assisting as needed.        OT Problem List: Impaired balance (sitting and/or standing);Cardiopulmonary status limiting activity;Decreased activity tolerance;Decreased knowledge of use of DME or AE;Decreased safety awareness   OT Treatment/Interventions: Self-care/ADL training;DME and/or AE instruction;Therapeutic activities;Patient/family education;Energy conservation    OT Goals(Current goals can be found in the care plan section) Acute Rehab OT Goals Patient Stated Goal: decreased pain OT Goal Formulation: With patient Time For Goal Achievement: 11/19/15 Potential to Achieve Goals: Good ADL Goals Pt Will Perform Upper Body Dressing: with set-up;sitting Pt Will Perform Lower Body Dressing: with supervision;sit to/from stand Pt Will Transfer to Toilet: with supervision;bedside commode Pt Will Perform Toileting - Clothing Manipulation and hygiene: with supervision;sit to/from stand Additional ADL Goal #1: Patient will utilize 3 energy conservation techniques without reminders during ADL tasks.  OT Frequency: Min 2X/week   Barriers to D/C: Decreased caregiver support          Co-evaluation PT/OT/SLP Co-Evaluation/Treatment: Yes Reason for Co-Treatment: For patient/therapist safety PT goals addressed during session: Mobility/safety with mobility OT goals addressed during session: ADL's and self-care      End of Session Equipment Utilized During Treatment: Rolling  walker;Oxygen Nurse Communication: Mobility status  Activity Tolerance: Patient tolerated treatment well Patient left: in bed;with call bell/phone within reach;with family/visitor present   Time: 1020-1050 OT Time Calculation (min): 30 min Charges:  OT General Charges $OT Visit: 1 Procedure OT Evaluation $OT Eval Moderate Complexity: 1 Procedure G-Codes: OT G-codes **NOT FOR INPATIENT CLASS** Functional Assessment Tool Used: clinical judgement Functional Limitation: Self care Self Care Current Status ([id number]): At least 40 percent but less than 60 percent impaired, limited or restricted Self Care Goal Status ([id number]): At least 1 percent but less than 20 percent impaired, limited or restricted  Orin Eberwein A 11/05/2015, 12:25 PM

## 2015-11-05 NOTE — Progress Notes (Addendum)
Patient ID: [name] Bondar., male   DOB: July 09, 1940, 75 y.o.   MRN: [id number]  PROGRESS NOTE    [name] Morning.  KGM:[id number] DOB: 18-Jan-1941 DOA: 11/03/2015  PCP: [name], NP   Brief Narrative:  27 -year-old male with past medical history significant for COPD, oxygen dependent on 3 L nasal cannula oxygen support at home, tobacco use. Patient presented to [facility] with weakness and fatigue over past few days prior to this admission. Patient also not eating very well. Patient was recently in hospital for COPD exacerbation, discharged 10/27/2015.  On admission, patient was hemodynamically stable. Blood work was notable for hemoglobin of 10.7, potassium 2.3, creatinine 1.57 and troponin level 0.06. He was admitted for further evaluation.   Assessment & Plan:   Active Problems: Generalized weakness and fatigue - Unclear etiology, possibly COPD - PT eval pending   Hypokalemia - Patient is on Lasix at home which likely has contributed to low potassium - Continue to supplement potassium - Check BMP in am   COPD - Stable respiratory status - Continue Spiriva and Dulera  Iron deficiency anemia - Continue iron supplementation  Dyslipidemia - Continue atorvastatin 40 mg at bedtime   DVT prophylaxis: Heparin subcutaneous Code Status: full code  Family Communication: Daughter at the bedside Disposition Plan: To SNF in am   Consultants:   None   Procedures:   None   Antimicrobials:   None  Subjective: No overnight events.  Objective: Vitals:   11/04/15 1451 11/04/15 2105 11/05/15 0506 11/05/15 0919  BP: 118/64  121/81   Pulse: (!) 102  (!) 103   Resp: 20  18   Temp: 98.7 F (37.1 C)  98.4 F (36.9 C)   TempSrc: Oral  Oral   SpO2: 94% 96% 94% 91%  Weight:      Height:        Intake/Output Summary (Last 24 hours) at 11/05/15 1030 Last data filed at 11/05/15 0508  Gross per 24 hour  Intake              560 ml  Output               475 ml  Net               85 ml   Filed Weights   11/03/15 1518 11/04/15 0537  Weight: 72.8 kg (160 lb 6.4 oz) 74.8 kg (164 lb 14.4 oz)    Examination:  General exam: No distress  Respiratory system: No rhonchi, no wheezing  Cardiovascular system: S1 & S2 heard, RRR Gastrointestinal system: (+) BS, non tender  Central nervous system: Nonfocal  Extremities: No LE swelling  Skin: UE Ecchymosis Psychiatry: Normal mood and behavior   Data Reviewed: I have personally reviewed following labs and imaging studies  CBC:  Recent Labs Lab 11/03/15 1050 11/04/15 0456  WBC 10.5 8.1  NEUTROABS 8.9* 6.7  HGB 10.7* 9.1*  HCT 36.7* 30.7*  MCV 87.0 85.0  PLT 352 302   Basic Metabolic Panel:  Recent Labs Lab 11/03/15 1050 11/03/15 1722 11/04/15 0456 11/05/15 0512  NA 134*  --  135 136  K 2.3*  --  2.8* 3.1*  CL 79*  --  90* 93*  CO2 42*  --  38* 34*  GLUCOSE 126*  --  85 94  BUN 39*  --  19 14  CREATININE 1.57*  --  0.90 0.68  CALCIUM 9.8  --  8.5* 8.9  MG  --  2.4  --  1.8  PHOS  --  1.5*  --   --    GFR: Estimated Creatinine Clearance: 78.4 mL/min (by C-G formula based on SCr of 0.68 mg/dL). Liver Function Tests:  Recent Labs Lab 11/03/15 1050  AST 27  ALT 18  ALKPHOS 84  BILITOT 0.5  PROT 7.0  ALBUMIN 3.8   No results for input(s): LIPASE, AMYLASE in the last 168 hours. No results for input(s): AMMONIA in the last 168 hours. Coagulation Profile: No results for input(s): INR, PROTIME in the last 168 hours. Cardiac Enzymes:  Recent Labs Lab 11/03/15 1050 11/03/15 1722 11/03/15 2349 11/04/15 0456  TROPONINI 0.06* 0.06* 0.04* 0.05*   BNP (last 3 results) No results for input(s): PROBNP in the last 8760 hours. HbA1C: No results for input(s): HGBA1C in the last 72 hours. CBG: No results for input(s): GLUCAP in the last 168 hours. Lipid Profile: No results for input(s): CHOL, HDL, LDLCALC, TRIG, CHOLHDL, LDLDIRECT in the last 72 hours. Thyroid  Function Tests: No results for input(s): TSH, T4TOTAL, FREET4, T3FREE, THYROIDAB in the last 72 hours. Anemia Panel: No results for input(s): VITAMINB12, FOLATE, FERRITIN, TIBC, IRON, RETICCTPCT in the last 72 hours. Urine analysis:    Component Value Date/Time   COLORURINE YELLOW 10/25/2015 1210   APPEARANCEUR CLEAR 10/25/2015 1210   LABSPEC 1.013 10/25/2015 1210   PHURINE 7.5 10/25/2015 1210   GLUCOSEU NEGATIVE 10/25/2015 1210   HGBUR NEGATIVE 10/25/2015 1210   BILIRUBINUR NEGATIVE 10/25/2015 1210   KETONESUR NEGATIVE 10/25/2015 1210   PROTEINUR NEGATIVE 10/25/2015 1210   UROBILINOGEN 0.2 10/29/2013 2011   NITRITE NEGATIVE 10/25/2015 1210   LEUKOCYTESUR NEGATIVE 10/25/2015 1210   Sepsis Labs: @LABRCNTIP (procalcitonin:4,lacticidven:4)   )No results found for this or any previous visit (from the past 240 hour(s)).    Radiology Studies: Dg Chest 2 View  Result Date: 11/03/2015 CLINICAL DATA:  Shortness of breath.  Multiple recent falls EXAM: CHEST  2 VIEW COMPARISON:  October 25, 2015 and May 06, 2015 Findings coal Scarring is noted bilaterally, most notably in the right mid lung and left base regions. There is somewhat diffuse interstitial thickening which is stable. There is no frank edema or consolidation. Heart size and pulmonary vascularity are normal. There is atherosclerotic calcification in the aorta. No adenopathy. Anterior wedging of several vertebral bodies stable. There is mild new anterior wedging of the T6 vertebral body. IMPRESSION: Mild anterior wedging of the T6 vertebral body, new from most recent study. Anterior wedging of other thoracic vertebral bodies is stable. Anterior wedging is most marked at T12. There is chronic scarring and interstitial thickening, stable. No edema or consolidation. Stable cardiac silhouette. Electronically Signed   By: Bretta Bang III M.D.   On: 11/03/2015 11:50   Portable Chest 1 View  Result Date: 11/04/2015 CLINICAL DATA:   Weakness and shortness of breath EXAM: PORTABLE CHEST 1 VIEW COMPARISON:  PA and lateral chest x-ray of November 03, 2015 FINDINGS: The lungs are adequately inflated. The interstitial markings are increased bilaterally. There is no alveolar infiltrate or pleural effusion. The cardiac silhouette is mildly enlarged though stable. The pulmonary vascularity is prominent centrally. There are bibasilar densities consistent with atelectasis or early infiltrate. IMPRESSION: COPD with pulmonary fibrotic changes. Superimposed bibasilar atelectasis or pneumonia. Mild pulmonary vascular congestion suggests low-grade CHF. Electronically Signed   By: David  [place] M.D.   On: 11/04/2015 07:21     Scheduled Meds: . aspirin EC  81 mg Oral Daily  . atorvastatin  40 mg Oral q1800  . ferrous sulfate  325 mg Oral Once per day on Mon Wed Fri  . heparin  5,000 Units Subcutaneous Q8H  . mouth rinse  15 mL Mouth Rinse BID  . mometasone-formoterol  2 puff Inhalation BID  . pantoprazole  40 mg Oral BID  . potassium chloride  40 mEq Oral Once  . tiotropium  18 mcg Inhalation Daily   Continuous Infusions:    LOS: 0 days    Time spent: 15 minutes  Greater than 50% of the time spent on counseling and coordinating the care.   Manson PasseyEVINE, Trinaty Bundrick, MD [facility] Pager [phone]  If 7PM-7AM, please contact night-coverage [email or web address] Password [email or web address] 11/05/2015, 10:30 AM

## 2015-11-05 NOTE — Evaluation (Signed)
Physical Therapy Evaluation Patient Details Name: [name] [name]. MRN: [id number] DOB: [birth year] Today's Date: 11/05/2015   History of Present Illness  75 -year-old male with past medical history significant for COPD, oxygen dependent on 3 L nasal cannula oxygen support at home, tobacco use and admitted for generalized weakness and faigue.  Pt with recent admission for COPD exacerbation  Clinical Impression  Pt admitted with above diagnosis. Pt currently with functional limitations due to the deficits listed below (see PT Problem List).  Pt will benefit from skilled PT to increase their independence and safety with mobility to allow discharge to the venue listed below.  Pt requiring increased time to perform mobility due to pain and SOB.  Pt required increased supplemental oxygen with mobility (see below).  Pt would benefit from SNF upon d/c.     Follow Up Recommendations SNF;Supervision for mobility/OOB    Equipment Recommendations  None recommended by PT    Recommendations for Other Services       Precautions / Restrictions Precautions Precautions: Fall Precaution Comments: O2 dependent Restrictions Weight Bearing Restrictions: No      Mobility  Bed Mobility Overal bed mobility: Needs Assistance Bed Mobility: Supine to Sit;Sit to Supine     Supine to sit: Supervision;HOB elevated Sit to supine: Supervision;HOB elevated   General bed mobility comments: increased time and effort due to flank pain  Transfers Overall transfer level: Needs assistance Equipment used: Rolling walker (2 wheeled) Transfers: Sit to/from Stand Sit to Stand: Min assist;+2 safety/equipment         General transfer comment: verbal cues for safe technique, SpO2 dropped to 84% on 3L O2 with only standing (also reports SOB and dizziness), increased to 4L O2 and performed pursed lip breathing with cues and SPO2 up to 90% prior to initiating ambulation; BP upon standing 122/74  mmHg  Ambulation/Gait Ambulation/Gait assistance: Min assist;+2 safety/equipment Ambulation Distance (Feet): 20 Feet Assistive device: Rolling walker (2 wheeled) Gait Pattern/deviations: Step-through pattern;Decreased stride length;Trunk flexed   Gait velocity interpretation: Below normal speed for age/gender General Gait Details: verbal cues for RW positioning and posture, very slow speed, SpO2 82% on 4L O2 and HR 121 bpm upon return to sitting on bed (improved back to 90% before returning to 3L 02)  Stairs            Wheelchair Mobility    Modified Rankin (Stroke Patients Only)       Balance Overall balance assessment: History of Falls (3 falls in the past month)                                           Pertinent Vitals/Pain Pain Assessment: Faces Pain Score:  (pt doesn't rate numerically) Faces Pain Scale: Hurts whole lot Pain Location: L flank/back (daughter states bruising from falls) Pain Descriptors / Indicators: Grimacing;Sore Pain Intervention(s): Limited activity within patient's tolerance;Monitored during session (daughter to call RN for meds)    Home Living Family/patient expects to be discharged to:: Private residence Living Arrangements: Alone Available Help at Discharge: Family;Available PRN/intermittently Type of Home: House Home Access: Stairs to enter Entrance Stairs-Rails: Left Entrance Stairs-Number of Steps: 3 Home Layout: One level Home Equipment: Emergency planning/management officer - 4 wheels;Other (comment) Additional Comments: 3L home O2    Prior Function Level of Independence: Independent         Comments: daughter reports pt does not use RW  in home due to space (she is suggestive of hoarding), typically has daughter checking in and assisting as needed.     Hand Dominance   Dominant Hand: Right    Extremity/Trunk Assessment   Upper Extremity Assessment: Generalized weakness           Lower Extremity Assessment:  Generalized weakness         Communication   Communication: No difficulties  Cognition Arousal/Alertness: Awake/alert Behavior During Therapy: WFL for tasks assessed/performed   Area of Impairment: Safety/judgement         Safety/Judgement: Decreased awareness of safety;Decreased awareness of deficits     General Comments: pt with poor judgement (requiring more assist, not using RW) however daughter reports this is baseline    General Comments General comments (skin integrity, edema, etc.): bruising and skin tears noted    Exercises     Assessment/Plan    PT Assessment Patient needs continued PT services  PT Problem List Decreased strength;Decreased activity tolerance;Decreased balance;Decreased mobility;Decreased safety awareness;Cardiopulmonary status limiting activity;Decreased knowledge of use of DME          PT Treatment Interventions DME instruction;Gait training;Stair training;Functional mobility training;Therapeutic activities;Therapeutic exercise;Balance training;Patient/family education    PT Goals (Current goals can be found in the Care Plan section)  Acute Rehab PT Goals Patient Stated Goal: decreased pain PT Goal Formulation: With patient Time For Goal Achievement: 11/12/15 Potential to Achieve Goals: Good    Frequency Min 3X/week   Barriers to discharge        Co-evaluation PT/OT/SLP Co-Evaluation/Treatment: Yes Reason for Co-Treatment: For patient/therapist safety PT goals addressed during session: Mobility/safety with mobility OT goals addressed during session: ADL's and self-care       End of Session Equipment Utilized During Treatment: Oxygen;Gait belt Activity Tolerance: Patient limited by fatigue;Patient limited by pain Patient left: in bed;with call bell/phone within reach;with bed alarm set;with family/visitor present Nurse Communication: Mobility status    Functional Assessment Tool Used: clinical judgement Functional Limitation:  Mobility: Walking and moving around Mobility: Walking and Moving Around Current Status ([id number](G8978): At least 20 percent but less than 40 percent impaired, limited or restricted Mobility: Walking and Moving Around Goal Status [phone](G8979): At least 1 percent but less than 20 percent impaired, limited or restricted    Time: 1019-1050 PT Time Calculation (min) (ACUTE ONLY): 31 min   Charges:   PT Evaluation $PT Eval Moderate Complexity: 1 Procedure     PT G Codes:   PT G-Codes **NOT FOR INPATIENT CLASS** Functional Assessment Tool Used: clinical judgement Functional Limitation: Mobility: Walking and moving around Mobility: Walking and Moving Around Current Status ([id number](G8978): At least 20 percent but less than 40 percent impaired, limited or restricted Mobility: Walking and Moving Around Goal Status [phone](G8979): At least 1 percent but less than 20 percent impaired, limited or restricted    Littie Chiem,KATHrine E 11/05/2015, 12:50 PM Zenovia JarredKati Chadric Kimberley, PT, DPT 11/05/2015 Pager: [phone]

## 2015-11-05 NOTE — Progress Notes (Signed)
LCSW spoke with daughter in law in reference to patient going to SNF. She is the main contact for patient: Tresa EndoKelly [phone]  Tresa EndoKelly is in agreement to SNF placement and has helped patient choose [facility]. She is available to sign patient into facility this weekend if he is medically stable for discharge.  LCSW made contact with facility and made them aware of patient's choice in facility.  Will have weekend follow up with disposition once patient is medically stable.  Deretha EmoryHannah Kong Packett LCSW, MSW Clinical Social Work: Optician, dispensingystem Wide Float Coverage for :  [employer] [phone]

## 2015-11-05 NOTE — Clinical Social Work Placement (Signed)
   CLINICAL SOCIAL WORK PLACEMENT  NOTE  Date:  11/05/2015  Patient Details  Name: Janie Morningddie Noviello Jr. MRN: [id number] Date of Birth: May 26, 1940  Clinical Social Work is seeking post-discharge placement for this patient at the Skilled  Nursing Facility level of care (*CSW will initial, date and re-position this form in  chart as items are completed):  Yes   Patient/family provided with [place] Clinical Social Work Department's list of facilities offering this level of care within the geographic area requested by the patient (or if unable, by the patient's family).  Yes   Patient/family informed of their freedom to choose among providers that offer the needed level of care, that participate in Medicare, Medicaid or managed care program needed by the patient, have an available bed and are willing to accept the patient.  Yes   Patient/family informed of 's ownership interest in [facility] and [facility], as well as of the fact that they are under no obligation to receive care at these facilities.  PASRR submitted to EDS on 11/05/15     PASRR number received on 11/05/15     Existing PASRR number confirmed on       FL2 transmitted to all facilities in geographic area requested by pt/family on 11/05/15     FL2 transmitted to all facilities within larger geographic area on       Patient informed that his/her managed care company has contracts with or will negotiate with certain facilities, including the following:            Patient/family informed of bed offers received.  Patient chooses bed at       Physician recommends and patient chooses bed at      Patient to be transferred to   on  .  Patient to be transferred to facility by       Patient family notified on   of transfer.  Name of family member notified:        PHYSICIAN Please sign FL2     Additional Comment:    _______________________________________________ Raye Sorrowoble, Kamaiya Antilla N, LCSW 11/05/2015,  9:53 AM

## 2015-11-05 NOTE — NC FL2 (Signed)
[place] MEDICAID FL2 LEVEL OF CARE SCREENING TOOL     IDENTIFICATION  Patient Name: [name] Morningddie Kisling Jr. Birthdate: [birth year] Sex: male Admission Date (Current Location): 11/03/2015  [facility] [place]   Fac[facility] and Address:  [facility],  [address], [place] [id number]      Provider Number: [id number]  Attending Physician Name and Address:  Alison MurrayAlma M Devine, MD  Relative Name and Phone Number:       Current Level of Care: Hospital Recommended Level of Care: Skilled Nursing Facility Prior Approval Number:    Date Approved/Denied:   PASRR Number:   [id number]  Discharge Plan: SNF    Current Diagnoses: Patient Active Problem List   Diagnosis Date Noted  . Acute kidney injury (HCC) 11/03/2015  . Hyponatremia 11/03/2015  . Falls   . Shortness of breath 10/25/2015  . Abdominal pain 08/19/2015  . Elevated troponin 08/19/2015  . Chest pressure 08/19/2015  . Diastolic CHF, acute on chronic (HCC)   . Troponin level elevated   . B12 deficiency 05/12/2015  . Iron deficiency anemia   . Abdominal distension   . COPD exacerbation (HCC) 05/06/2015  . Influenza B 05/06/2015  . Cataract of right eye 05/06/2015  . COPD (chronic obstructive pulmonary disease) (HCC) 05/09/2014  . Acute on chronic respiratory failure with hypoxia (HCC) 05/09/2014  . Influenza A H1N1 infection 05/09/2014  . AKI (acute kidney injury) (HCC) 05/09/2014  . Dehydration 05/09/2014  . Lobar pneumonia due to unspecified organism 05/05/2014  . Tobacco abuse 01/19/2014  . History of DVT (deep vein thrombosis)   . On home oxygen therapy   . Hypokalemia 10/29/2013  . GERD 12/25/2008  . Essential hypertension 11/09/2008  . 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 11/09/2008    Orientation RESPIRATION BLADDER Height & Weight     Self, Time, Situation, Place  O2 (3L) Continent Weight:  (patient refused) Height:  5\' 8"  (172.7 cm)   BEHAVIORAL SYMPTOMS/MOOD NEUROLOGICAL BOWEL NUTRITION STATUS      Continent Diet (regular)  AMBULATORY STATUS COMMUNICATION OF NEEDS Skin   Extensive Assist Verbally Normal                       Personal Care Assistance Level of Assistance  Bathing, Feeding, Dressing Bathing Assistance: Limited assistance Feeding assistance: Independent Dressing Assistance: Independent     Functional Limitations Info  Sight, Hearing, Speech Sight Info: Adequate Hearing Info: Adequate Speech Info: Adequate    SPECIAL CARE FACTORS FREQUENCY  PT (By licensed PT), OT (By licensed OT)     PT Frequency: 5x OT Frequency: 5x            Contractures Contractures Info: Not present    Additional Factors Info  Code Status, Allergies Code Status Info: Full Code Allergies Info: Adhesive Tape, Advair Diskus Fluticasone-salmeterol, Brovana Arformoterol, Codeine, Ferrous Sulfate, Other, Prednisone           Current Medications (11/05/2015):  This is the current hospital active medication list Current Facility-Administered Medications  Medication Dose Route Frequency Provider Last Rate Last Dose  . acetaminophen (TYLENOL) tablet 650 mg  650 mg Oral Q6H PRN [place] Cyndie MullL Johnson, MD   650 mg at 11/04/15 1413   Or  . acetaminophen (TYLENOL) suppository 650 mg  650 mg Rectal Q6H PRN [place] Cyndie MullL Johnson, MD      . ALPRAZolam Prudy Feeler(XANAX) tablet 0.5 mg  0.5 mg Oral TID PRN Clanford L Laural BenesJohnson,  MD   0.5 mg at 11/05/15 0054  . aspirin EC tablet 81 mg  81 mg Oral Daily Cl[place] Cyndie Mull, MD   81 mg at 11/04/15 0933  . atorvastatin (LIPITOR) tablet 40 mg  40 mg Oral q1800 Clanford Cyndie Mull, MD   40 mg at 11/04/15 1722  . dextromethorphan-guaiFENesin (MUCINEX DM) 30-600 MG per 12 hr tablet 1 tablet  1 tablet Oral BID PRN Clanford L Johnson, MD      . ferrous sulfate tablet 325 mg  325 mg Oral Once per day on Mon Wed Fri Cleora Fleet, MD   325 mg at 11/03/15 1637  . heparin injection 5,000 Units  5,000  Units Subcutaneous Q8H Cl[place] Cyndie Mull, MD   5,000 Units at 11/04/15 2200  . ipratropium-albuterol (DUONEB) 0.5-2.5 (3) MG/3ML nebulizer solution 3 mL  3 mL Nebulization Q6H PRN [place] Cyndie Mull, MD   3 mL at 11/05/15 0925  . MEDLINE mouth rinse  15 mL Mouth Rinse BID Clanford L Johnson, MD   15 mL at 11/04/15 2200  . mometasone-formoterol (DULERA) 200-5 MCG/ACT inhaler 2 puff  2 puff Inhalation BID Cl[place] Cyndie Mull, MD   2 puff at 11/05/15 9604  . ondansetron (ZOFRAN) tablet 4 mg  4 mg Oral Q6H PRN [place] Cyndie Mull, MD       Or  . ondansetron (ZOFRAN) injection 4 mg  4 mg Intravenous Q6H PRN Clanford L Johnson, MD      . pantoprazole (PROTONIX) EC tablet 40 mg  40 mg Oral BID Cl[place] Cyndie Mull, MD   40 mg at 11/04/15 2200  . polyethylene glycol (MIRALAX / GLYCOLAX) packet 17 g  17 g Oral Daily PRN Clanford L Johnson, MD      . potassium chloride SA (K-DUR,KLOR-CON) CR tablet 40 mEq  40 mEq Oral Once Alison Murray, MD      . sodium chloride (OCEAN) 0.65 % nasal spray 1 spray  1 spray Nasal PRN [place] Cyndie Mull, MD      . tiotropium (SPIRIVA) inhalation capsule 18 mcg  18 mcg Inhalation Daily Cl[place] Cyndie Mull, MD   18 mcg at 11/05/15 5409  . traMADol (ULTRAM) tablet 50 mg  50 mg Oral Q6H PRN [place] Cyndie Mull, MD   50 mg at 11/05/15 0054     Discharge Medications: Please see discharge summary for a list of discharge medications.  Relevant Imaging Results:  Relevant Lab Results:   Additional Information SSN:  [id number]  Raye Sorrow, [place]

## 2015-11-05 NOTE — Progress Notes (Signed)
ABN GIVEN TO PT.  HE WILL DISCHARGE IN AM TO [facility].

## 2015-11-06 DIAGNOSIS — E876 Hypokalemia: Secondary | ICD-10-CM | POA: Diagnosis not present

## 2015-11-06 DIAGNOSIS — I10 Essential (primary) hypertension: Secondary | ICD-10-CM | POA: Diagnosis not present

## 2015-11-06 DIAGNOSIS — I5032 Chronic diastolic (congestive) heart failure: Secondary | ICD-10-CM | POA: Diagnosis not present

## 2015-11-06 MED ORDER — POTASSIUM CHLORIDE CRYS ER 20 MEQ PO TBCR
40.0000 meq | EXTENDED_RELEASE_TABLET | Freq: Once | ORAL | Status: AC
  Administered 2015-11-06: 40 meq via ORAL
  Filled 2015-11-06: qty 2

## 2015-11-06 MED ORDER — ACETAMINOPHEN 325 MG PO TABS: 650.0000 mg | ORAL_TABLET | Freq: Four times a day (QID) | ORAL | 0 refills | Status: AC | PRN

## 2015-11-06 MED ORDER — TRAMADOL HCL 50 MG PO TABS: 50.0000 mg | ORAL_TABLET | Freq: Every day | ORAL | 0 refills | Status: DC | PRN

## 2015-11-06 MED ORDER — ALPRAZOLAM 0.5 MG PO TABS: 0.5000 mg | ORAL_TABLET | Freq: Four times a day (QID) | ORAL | 0 refills | Status: DC | PRN

## 2015-11-06 MED ORDER — FUROSEMIDE 40 MG PO TABS: 40.0000 mg | ORAL_TABLET | Freq: Every day | ORAL | 0 refills | Status: AC

## 2015-11-06 MED ORDER — ALBUTEROL SULFATE (2.5 MG/3ML) 0.083% IN NEBU
2.5000 mg | INHALATION_SOLUTION | Freq: Three times a day (TID) | RESPIRATORY_TRACT | Status: DC
  Administered 2015-11-06: 2.5 mg via RESPIRATORY_TRACT
  Filled 2015-11-06 (×2): qty 3

## 2015-11-06 MED ORDER — POTASSIUM CHLORIDE CRYS ER 10 MEQ PO TBCR: 10.0000 meq | EXTENDED_RELEASE_TABLET | Freq: Every day | ORAL | 0 refills | Status: AC

## 2015-11-06 NOTE — Discharge Summary (Addendum)
Physician Discharge Summary  [name] ZOX:[id number] DOB: 1941-02-03 DOA: 11/03/2015  PCP: [name], NP  Admit date: 11/03/2015 Discharge date: 11/06/2015  Recommendations for Outpatient Follow-up:  1. 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 Patient presented to [facility] with weakness and fatigue over past few days prior to this admission. Patient also not eating very well. Patient was recently in hospital for COPD exacerbation, discharged 10/27/2015.  On admission, patient was hemodynamically stable. Blood work was notable for hemoglobin of 10.7, potassium 2.3, creatinine 1.57 and troponin level 0.06. He was admitted for further evaluation.   Hospital Course:    Assessment & Plan:   Active Problems: Generalized weakness and fatigue - Unclear etiology, possibly COPD - PT eval - SNF placement   Hypokalemia - Patient is on Lasix at home which likely has contributed to low potassium - Continue to supplement potassium  COPD - Stable respiratory status - Continue Spiriva and Dulera  Chronic diastolic CHF - Continue lasix on discharge once a day regimen   Essential hypertension - Continue lasix   Iron deficiency anemia - Continue iron supplementation  Dyslipidemia - Continue atorvastatin 40 mg at bedtime   DVT prophylaxis: Heparin subcutaneous Code Status:  full code  Family Communication: Daughter at the bedside   Consultants:   None   Procedures:   None   Antimicrobials:   None   Signed:  Manson Passey, MD  [facility] 11/06/2015, 9:37 AM  Pager #: [phone]  Time spent in minutes: less than 30 minutes  Discharge Exam: Vitals:   11/06/15 0044 11/06/15 0530  BP: (!) 117/59 (!) 143/68  Pulse: 95 98  Resp:  18  Temp: 99 F (37.2 C) 98.3 F (36.8 C)   Vitals:   11/05/15 2054 11/05/15 2135 11/06/15 0044 11/06/15 0530  BP: 116/69  (!) 117/59 (!) 143/68  Pulse: 75  95 98  Resp: 20   18  Temp: 99.4 F (37.4 C)  99 F (37.2 C) 98.3 F (36.8 C)  TempSrc: Oral  Oral Oral  SpO2: 97% 93% 94% 100%  Weight:    74.9 kg (165 lb 2 oz)  Height:        General: Pt is alert, follows commands appropriately, not in acute distress Cardiovascular: Regular rate and rhythm, S1/S2 + Respiratory: Clear to auscultation bilaterally, no wheezing, no crackles, no rhonchi Abdominal: Soft, non tender, non distended, bowel sounds +, no guarding Extremities: no edema,  pulses palpable bilaterally DP and PT Neuro: Grossly nonfocal  Discharge Instructions  Discharge Instructions    Call MD for:  persistant nausea and vomiting    Complete by:  As directed    Call MD for:  redness, tenderness, or signs of infection (pain, swelling, redness, odor or green/yellow discharge around incision site)    Complete by:  As directed    Call MD for:  severe uncontrolled pain    Complete by:  As directed    Diet - low sodium heart healthy    Complete by:  As directed    Increase activity slowly    Complete by:  As directed        Medication List    STOP taking these medications   hydrochlorothiazide 25 MG tablet Commonly known as:  HYDRODIURIL   ipratropium 0.02 % nebulizer solution Commonly known as:  ATROVENT   levofloxacin 750 MG tablet Commonly known as:  LEVAQUIN   predniSONE 20 MG tablet Commonly known as:   DELTASONE     TAKE these medications   acetaminophen 325 MG tablet Commonly known as:  TYLENOL Take 2 tablets (650 mg total) by mouth every 6 (six) hours as needed for mild pain (or Fever >/= 101).   albuterol 0.63 MG/3ML nebulizer solution Commonly known as:  ACCUNEB Take 1 ampule by nebulization every 6 (six) hours as needed for wheezing.   albuterol 108 (90 Base) MCG/ACT inhaler Commonly known as:  PROVENTIL HFA;VENTOLIN HFA Inhale 2 puffs into the lungs every 6 (six) hours as needed for wheezing or shortness of breath.   ALPRAZolam 0.5 MG tablet Commonly known as:  XANAX Take 1 tablet (0.5 mg total) by mouth 4 (four) times daily as needed for anxiety.   ANTACID PO Take 1 tablet by mouth every 4 (four) hours as needed (for acid reflux or heartburn).   aspirin 81 MG EC tablet Take 1 tablet (81 mg total) by mouth daily.   atorvastatin 40 MG tablet Commonly known as:  LIPITOR Take 1 tablet (40 mg total) by mouth daily at 6 PM.   dextromethorphan-guaiFENesin 30-600 MG 12hr tablet Commonly known as:  MUCINEX DM Take 1 tablet by mouth 2 (two) times daily. What changed:  when to take this  reasons to take this   furosemide 40 MG tablet Commonly known as:  LASIX Take 1 tablet (40 mg total) by mouth daily. What changed:  when to take this   ipratropium-albuterol 0.5-2.5 (3) MG/3ML Soln Commonly known as:  DUONEB Take 3 mLs by nebulization every 6 (six) hours as needed. What changed:  reasons to take this   Iron 142 (45 Fe) MG Tbcr Take 1 tablet by mouth 3 (three) times a week.   mometasone-formoterol 200-5 MCG/ACT Aero Commonly known as:  DULERA Inhale 2 puffs into the lungs 2 (two) times daily.   ondansetron 8 MG tablet Commonly known as:  ZOFRAN Take 8 mg by mouth every 8 (eight) hours as needed for nausea or vomiting.   pantoprazole 40 MG tablet Commonly known as:  PROTONIX Take 1 tablet (40 mg total) by mouth 2 (two) times daily.   polyethylene glycol  packet Commonly known as:  MIRALAX / GLYCOLAX Take 17 g by mouth daily as needed for mild constipation.   potassium chloride 10 MEQ tablet Commonly known as:  K-DUR,KLOR-CON Take 1 tablet (10 mEq total) by mouth daily. What changed:  medication strength  how much to take   sodium chloride 0.65 % Soln nasal spray Commonly known as:  OCEAN Place 1 spray into the nose as needed for congestion (Dry nasal passages).   SPIRIVA HANDIHALER 18 MCG inhalation capsule Generic drug:  tiotropium Place 1 puff into inhaler and inhale daily as needed for wheezing.   traMADol 50 MG tablet Commonly known as:  ULTRAM Take 1 tablet (50 mg total) by mouth daily as needed for moderate pain.   vitamin  E 100 UNIT capsule Take 100 Units by mouth daily as needed. supplement      Follow-up Information    [place], [name], NP. Schedule an appointment as soon as possible for a visit in 1 week(s).   Contact information: [facility] [address] [place] [id number] [phone]            The results of significant diagnostics from this hospitalization (including imaging, microbiology, ancillary and laboratory) are listed below for reference.    Significant Diagnostic Studies: Dg Chest 2 View  Result Date: 11/03/2015 CLINICAL DATA:  Shortness of breath.  Multiple recent falls EXAM: CHEST  2 VIEW COMPARISON:  October 25, 2015 and May 06, 2015 Findings coal Scarring is noted bilaterally, most notably in the right mid lung and left base regions. There is somewhat diffuse interstitial thickening which is stable. There is no frank edema or consolidation. Heart size and pulmonary vascularity are normal. There is atherosclerotic calcification in the aorta. No adenopathy. Anterior wedging of several vertebral bodies stable. There is mild new anterior wedging of the T6 vertebral body. IMPRESSION: Mild anterior wedging of the T6 vertebral body, new from most recent study.  Anterior wedging of other thoracic vertebral bodies is stable. Anterior wedging is most marked at T12. There is chronic scarring and interstitial thickening, stable. No edema or consolidation. Stable cardiac silhouette. Electronically Signed   By: Bretta BangWilliam  Woodruff III M.D.   On: 11/03/2015 11:50   Dg Chest 2 View  Result Date: 10/25/2015 CLINICAL DATA:  Shortness of Breath EXAM: CHEST  2 VIEW COMPARISON:  05/06/2015 FINDINGS: Cardiac shadow is within normal limits. Diffuse interstitial changes are again identified but stable from the prior exam consistent with underlying scarring. No focal infiltrate or sizable effusion is seen. Compression deformities are again noted in the mid and lower thoracic spine. Some of these have progressed in the interval from the prior exam. Correlation with point tenderness is recommended. IMPRESSION: Chronic interstitial changes. Compression deformities of the thoracic spine which have progressed in the interval from the prior exam. Correlation to point tenderness is recommended. Electronically Signed   By: Alcide CleverMark  Lukens M.D.   On: 10/25/2015 15:46   Dg Lumbar Spine 2-3 Views  Result Date: 10/26/2015 CLINICAL DATA:  Fall. EXAM: LUMBAR SPINE - 2-3 VIEW COMPARISON:  CT 08/19/2015. FINDINGS: T12 compression fracture is noted. Compression fractures prominent. This appears new from prior CT of 08/19/2015. Diffuse osteopenia and degenerative change. Moderate gastric distention noted. IMPRESSION: 1. T12 prominent compression fracture noted. This is new from CT of 08/19/2015. T9 compression fracture cannot be excluded. 2. Diffuse osteopenia degenerative change. 3. Moderate gastric distention. Electronically Signed   By: Maisie Fushomas  Register   On: 10/26/2015 12:15   Dg Pelvis 1-2 Views  Result Date: 10/25/2015 CLINICAL DATA:  Multiple falls, bilateral pelvic tenderness EXAM: PELVIS - 1-2 VIEW COMPARISON:  CT abdomen pelvis dated 08/19/2015 FINDINGS: No fracture or dislocation is seen.  Bilateral hip joint spaces are preserved. Visualized bony pelvis appears intact. Mild degenerative changes of the lower lumbar spine. IMPRESSION: No fracture or dislocation is seen. Electronically Signed   By: Charline BillsSriyesh  Krishnan M.D.   On: 10/25/2015 13:41   Portable Chest 1 View  Result Date: 11/04/2015 CLINICAL DATA:  Weakness and shortness of breath EXAM: PORTABLE CHEST 1 VIEW COMPARISON:  PA and lateral chest x-ray of November 03, 2015 FINDINGS: The lungs are adequately inflated. The interstitial markings are increased bilaterally. There is no alveolar infiltrate or  pleural effusion. The cardiac silhouette is mildly enlarged though stable. The pulmonary vascularity is prominent centrally. There are bibasilar densities consistent with atelectasis or early infiltrate. IMPRESSION: COPD with pulmonary fibrotic changes. Superimposed bibasilar atelectasis or pneumonia. Mild pulmonary vascular congestion suggests low-grade CHF. Electronically Signed   By: David  [place] M.D.   On: 11/04/2015 07:21    Microbiology: No results found for this or any previous visit (from the past 240 hour(s)).   Labs: Basic Metabolic Panel:  Recent Labs Lab 11/03/15 1050 11/03/15 1722 11/04/15 0456 11/05/15 0512  NA 134*  --  135 136  K 2.3*  --  2.8* 3.1*  CL 79*  --  90* 93*  CO2 42*  --  38* 34*  GLUCOSE 126*  --  85 94  BUN 39*  --  19 14  CREATININE 1.57*  --  0.90 0.68  CALCIUM 9.8  --  8.5* 8.9  MG  --  2.4  --  1.8  PHOS  --  1.5*  --   --    Liver Function Tests:  Recent Labs Lab 11/03/15 1050  AST 27  ALT 18  ALKPHOS 84  BILITOT 0.5  PROT 7.0  ALBUMIN 3.8   No results for input(s): LIPASE, AMYLASE in the last 168 hours. No results for input(s): AMMONIA in the last 168 hours. CBC:  Recent Labs Lab 11/03/15 1050 11/04/15 0456  WBC 10.5 8.1  NEUTROABS 8.9* 6.7  HGB 10.7* 9.1*  HCT 36.7* 30.7*  MCV 87.0 85.0  PLT 352 302   Cardiac Enzymes:  Recent Labs Lab 11/03/15 1050  11/03/15 1722 11/03/15 2349 11/04/15 0456  TROPONINI 0.06* 0.06* 0.04* 0.05*   BNP: BNP (last 3 results)  Recent Labs  08/19/15 1708 10/25/15 1022 11/03/15 1050  BNP 98.3 111.1* 162.9*    ProBNP (last 3 results) No results for input(s): PROBNP in the last 8760 hours.  CBG: No results for input(s): GLUCAP in the last 168 hours.

## 2015-11-06 NOTE — Clinical Social Work Placement (Signed)
   CLINICAL SOCIAL WORK PLACEMENT  NOTE  Date:  11/06/2015  Patient Details  Name: [name] Morningddie Gabler Jr. MRN: [id number] Date of Birth: May 04, 1940  Clinical Social Work is seeking post-discharge placement for this patient at the Skilled  Nursing Facility level of care (*CSW will initial, date and re-position this form in  chart as items are completed):  Yes   Patient/family provided with [facility] Clinical Social Work Department's list of facilities offering this level of care within the geographic area requested by the patient (or if unable, by the patient's family).  Yes   Patient/family informed of their freedom to choose among providers that offer the needed level of care, that participate in Medicare, Medicaid or managed care program needed by the patient, have an available bed and are willing to accept the patient.  Yes   Patient/family informed of [facility] ownership interest [facility] and [facility], as well as of the fact that they are under no obligation to receive care at these facilities.  PASRR submitted to EDS on 11/05/15     PASRR number received on 11/05/15     Existing PASRR number confirmed on       FL2 transmitted to all facilities in geographic area requested by pt/family on 11/05/15     FL2 transmitted to all facilities within larger geographic area on       Patient informed that his/her managed care company has contracts with or will negotiate with certain facilities, including the following:        Yes   Patient/family informed of bed offers received.  [facility]     Physician recommends and patient chooses bed at      [facility]  Patient to be transferred to facility by FAMILY     Patient family notified on 11/06/15 of transfer.  Name of family member notified:  DAUGHTER     PHYSICIAN Please sign FL2     Additional Comment:   Pt /  daughter are in agreement with d/c to [facility] today. Daughter requesting to transport and will provide oxygen for transport. D/C summary sent to SNF for review. Scripts included in d/c packet. # for report provided to nsg. NSG will provide d/c packet to daughter.   _______________________________________________ Royetta AsalHaidinger, Helaina Stefano Lee, LCSW  [phone] 11/06/2015, 1:35 PM

## 2015-11-06 NOTE — Care Management Note (Signed)
Case Management Note  Patient Details  Name: [name] Morningddie Allocca Jr. MRN: [id number] Date of Birth: 09/29/1940  Subjective/Objective:   HTN, COPD                 Action/Plan: Discharge Planning: AVS reviewed: Chart reviewed. Scheduled dc to SNF 11/06/2015. CSW following for SNF placement.    Expected Discharge Date:  11/06/2015           Expected Discharge Plan:  Skilled Nursing Facility  In-House Referral:  Clinical Social Work  Discharge planning Services  CM Consult  Post Acute Care Choice:  NA Choice offered to:  NA  DME Arranged:  N/A DME Agency:  NA  HH Arranged:  NA HH Agency:  NA  Status of Service:  Completed, signed off  If discussed at [employer] Length of Stay Meetings, dates discussed:    Additional Comments:  Elliot CousinShavis, Constance Hackenberg Ellen, RN 11/06/2015, 10:17 AM

## 2015-11-06 NOTE — Discharge Instructions (Signed)

## 2015-11-07 ENCOUNTER — Encounter (HOSPITAL_COMMUNITY): Payer: Self-pay | Admitting: Emergency Medicine

## 2015-11-07 ENCOUNTER — Inpatient Hospital Stay (HOSPITAL_COMMUNITY)
Admission: EM | Admit: 2015-11-07 | Discharge: 2015-11-10 | DRG: 193 | Disposition: A | Discharge status: Skilled Nursing Facility | Payer: Medicare Other | Attending: Internal Medicine | Admitting: Internal Medicine

## 2015-11-07 ENCOUNTER — Emergency Department (HOSPITAL_COMMUNITY): Payer: Medicare Other

## 2015-11-07 DIAGNOSIS — J9621 Acute and chronic respiratory failure with hypoxia: Secondary | ICD-10-CM | POA: Diagnosis present

## 2015-11-07 DIAGNOSIS — J441 Chronic obstructive pulmonary disease with (acute) exacerbation: Secondary | ICD-10-CM | POA: Diagnosis present

## 2015-11-07 DIAGNOSIS — D509 Iron deficiency anemia, unspecified: Secondary | ICD-10-CM | POA: Diagnosis present

## 2015-11-07 DIAGNOSIS — Z7951 Long term (current) use of inhaled steroids: Secondary | ICD-10-CM

## 2015-11-07 DIAGNOSIS — J440 Chronic obstructive pulmonary disease with acute lower respiratory infection: Secondary | ICD-10-CM | POA: Diagnosis present

## 2015-11-07 DIAGNOSIS — I10 Essential (primary) hypertension: Secondary | ICD-10-CM | POA: Diagnosis present

## 2015-11-07 DIAGNOSIS — I110 Hypertensive heart disease with heart failure: Secondary | ICD-10-CM | POA: Diagnosis present

## 2015-11-07 DIAGNOSIS — Y95 Nosocomial condition: Secondary | ICD-10-CM | POA: Diagnosis present

## 2015-11-07 DIAGNOSIS — Z888 Allergy status to other drugs, medicaments and biological substances status: Secondary | ICD-10-CM | POA: Diagnosis not present

## 2015-11-07 DIAGNOSIS — Z803 Family history of malignant neoplasm of breast: Secondary | ICD-10-CM

## 2015-11-07 DIAGNOSIS — Z961 Presence of intraocular lens: Secondary | ICD-10-CM | POA: Diagnosis present

## 2015-11-07 DIAGNOSIS — Z820 Family history of epilepsy and other diseases of the nervous system: Secondary | ICD-10-CM

## 2015-11-07 DIAGNOSIS — Z9981 Dependence on supplemental oxygen: Secondary | ICD-10-CM | POA: Diagnosis not present

## 2015-11-07 DIAGNOSIS — R000 Tachycardia, unspecified: Secondary | ICD-10-CM | POA: Diagnosis present

## 2015-11-07 DIAGNOSIS — Z23 Encounter for immunization: Secondary | ICD-10-CM | POA: Diagnosis not present

## 2015-11-07 DIAGNOSIS — Z86718 Personal history of other venous thrombosis and embolism: Secondary | ICD-10-CM

## 2015-11-07 DIAGNOSIS — J189 Pneumonia, unspecified organism: Principal | ICD-10-CM | POA: Diagnosis present

## 2015-11-07 DIAGNOSIS — Z7982 Long term (current) use of aspirin: Secondary | ICD-10-CM | POA: Diagnosis not present

## 2015-11-07 DIAGNOSIS — R0602 Shortness of breath: Secondary | ICD-10-CM | POA: Diagnosis present

## 2015-11-07 DIAGNOSIS — K219 Gastro-esophageal reflux disease without esophagitis: Secondary | ICD-10-CM

## 2015-11-07 DIAGNOSIS — D508 Other iron deficiency anemias: Secondary | ICD-10-CM | POA: Diagnosis not present

## 2015-11-07 DIAGNOSIS — Z881 Allergy status to other antibiotic agents status: Secondary | ICD-10-CM | POA: Diagnosis not present

## 2015-11-07 DIAGNOSIS — I5032 Chronic diastolic (congestive) heart failure: Secondary | ICD-10-CM | POA: Diagnosis present

## 2015-11-07 DIAGNOSIS — Z79899 Other long term (current) drug therapy: Secondary | ICD-10-CM | POA: Diagnosis not present

## 2015-11-07 DIAGNOSIS — D649 Anemia, unspecified: Secondary | ICD-10-CM

## 2015-11-07 LAB — COMPREHENSIVE METABOLIC PANEL
ALBUMIN: 2.9 g/dL — AB (ref 3.5–5.0)
ALT: 16 U/L — ABNORMAL LOW (ref 17–63)
AST: 23 U/L (ref 15–41)
Alkaline Phosphatase: 82 U/L (ref 38–126)
Anion gap: 7 (ref 5–15)
BUN: 12 mg/dL (ref 6–20)
CO2: 34 mmol/L — ABNORMAL HIGH (ref 22–32)
CREATININE: 0.74 mg/dL (ref 0.61–1.24)
Calcium: 9.1 mg/dL (ref 8.9–10.3)
Chloride: 97 mmol/L — ABNORMAL LOW (ref 101–111)
GFR calc Af Amer: >60 mL/min (ref >60)
GFR calc non Af Amer: >60 mL/min (ref >60)
Glucose, Bld: 108 mg/dL — ABNORMAL HIGH (ref 65–99)
Potassium: 4.2 mmol/L (ref 3.5–5.1)
Sodium: 138 mmol/L (ref 135–145)
TOTAL PROTEIN: 6 g/dL — AB (ref 6.5–8.1)
Total Bilirubin: 0.7 mg/dL (ref 0.3–1.2)

## 2015-11-07 LAB — CBC WITH DIFFERENTIAL/PLATELET
BASOS ABS: 0 10*3/uL (ref 0.0–0.1)
BASOS PCT: 0 %
EOS ABS: 0.1 10*3/uL (ref 0.0–0.7)
EOS PCT: 1 %
HCT: 35.7 % — ABNORMAL LOW (ref 39.0–52.0)
Hemoglobin: 10.3 g/dL — ABNORMAL LOW (ref 13.0–17.0)
LYMPHS PCT: 8 %
Lymphs Abs: 1 10*3/uL (ref 0.7–4.0)
MCH: 25.6 pg — ABNORMAL LOW (ref 26.0–34.0)
MCHC: 28.9 g/dL — ABNORMAL LOW (ref 30.0–36.0)
MCV: 88.8 fL (ref 78.0–100.0)
Monocytes Absolute: 0.9 10*3/uL (ref 0.1–1.0)
Monocytes Relative: 7 %
Neutro Abs: 10.6 10*3/uL — ABNORMAL HIGH (ref 1.7–7.7)
Neutrophils Relative %: 84 %
PLATELETS: 304 10*3/uL (ref 150–400)
RBC: 4.02 MIL/uL — AB (ref 4.22–5.81)
RDW: 17.5 % — ABNORMAL HIGH (ref 11.5–15.5)
WBC: 12.6 10*3/uL — AB (ref 4.0–10.5)

## 2015-11-07 LAB — I-STAT CG4 LACTIC ACID, ED: Lactic Acid, Venous: 1.62 mmol/L (ref 0.5–1.9)

## 2015-11-07 LAB — BRAIN NATRIURETIC PEPTIDE: B NATRIURETIC PEPTIDE 5: 168.8 pg/mL — AB (ref 0.0–100.0)

## 2015-11-07 LAB — MRSA PCR SCREENING: MRSA BY PCR: NEGATIVE

## 2015-11-07 LAB — I-STAT TROPONIN, ED: Troponin i, poc: 0.03 ng/mL (ref 0.00–0.08)

## 2015-11-07 LAB — APTT: APTT: 28 s (ref 24–36)

## 2015-11-07 LAB — PROTIME-INR
INR: 0.96
Prothrombin Time: 12.8 seconds (ref 11.4–15.2)

## 2015-11-07 LAB — PROCALCITONIN: Procalcitonin: <0.1 ng/mL

## 2015-11-07 LAB — LACTIC ACID, PLASMA: Lactic Acid, Venous: 1.2 mmol/L (ref 0.5–1.9)

## 2015-11-07 MED ORDER — DM-GUAIFENESIN ER 30-600 MG PO TB12: 1.0000 | ORAL_TABLET | Freq: Two times a day (BID) | ORAL | Status: DC | PRN

## 2015-11-07 MED ORDER — METHYLPREDNISOLONE SODIUM SUCC 125 MG IJ SOLR
60.0000 mg | Freq: Once | INTRAMUSCULAR | Status: AC
  Administered 2015-11-07: 60 mg via INTRAVENOUS
  Filled 2015-11-07: qty 2

## 2015-11-07 MED ORDER — CEFEPIME HCL 2 G IJ SOLR
2.0000 g | Freq: Once | INTRAMUSCULAR | Status: AC
  Administered 2015-11-07: 2 g via INTRAVENOUS
  Filled 2015-11-07: qty 2

## 2015-11-07 MED ORDER — SODIUM CHLORIDE 0.9% FLUSH: 3.0000 mL | INTRAVENOUS | Status: DC | PRN

## 2015-11-07 MED ORDER — DEXTROSE 5 % IV SOLN
1.0000 g | Freq: Three times a day (TID) | INTRAVENOUS | Status: DC
  Administered 2015-11-07 – 2015-11-10 (×10): 1 g via INTRAVENOUS
  Filled 2015-11-07 (×11): qty 1

## 2015-11-07 MED ORDER — ATORVASTATIN CALCIUM 40 MG PO TABS
40.0000 mg | ORAL_TABLET | Freq: Every day | ORAL | Status: DC
  Administered 2015-11-07 – 2015-11-09 (×3): 40 mg via ORAL
  Filled 2015-11-07 (×3): qty 1

## 2015-11-07 MED ORDER — IPRATROPIUM-ALBUTEROL 0.5-2.5 (3) MG/3ML IN SOLN
3.0000 mL | Freq: Once | RESPIRATORY_TRACT | Status: AC
  Administered 2015-11-07: 3 mL via RESPIRATORY_TRACT
  Filled 2015-11-07: qty 3

## 2015-11-07 MED ORDER — IPRATROPIUM-ALBUTEROL 0.5-2.5 (3) MG/3ML IN SOLN
3.0000 mL | Freq: Three times a day (TID) | RESPIRATORY_TRACT | Status: DC
  Administered 2015-11-08 – 2015-11-10 (×7): 3 mL via RESPIRATORY_TRACT
  Filled 2015-11-07 (×6): qty 3

## 2015-11-07 MED ORDER — ACETAMINOPHEN 325 MG PO TABS
650.0000 mg | ORAL_TABLET | Freq: Four times a day (QID) | ORAL | Status: DC | PRN
  Administered 2015-11-07 – 2015-11-09 (×4): 650 mg via ORAL
  Filled 2015-11-07 (×4): qty 2

## 2015-11-07 MED ORDER — VANCOMYCIN HCL IN DEXTROSE 1-5 GM/200ML-% IV SOLN
1000.0000 mg | Freq: Once | INTRAVENOUS | Status: AC
  Administered 2015-11-07: 1000 mg via INTRAVENOUS
  Filled 2015-11-07: qty 200

## 2015-11-07 MED ORDER — FERROUS SULFATE 325 (65 FE) MG PO TABS
325.0000 mg | ORAL_TABLET | ORAL | Status: DC
  Filled 2015-11-07 (×2): qty 1

## 2015-11-07 MED ORDER — TRAMADOL HCL 50 MG PO TABS
50.0000 mg | ORAL_TABLET | Freq: Every day | ORAL | Status: DC | PRN
  Administered 2015-11-07 – 2015-11-09 (×3): 50 mg via ORAL
  Filled 2015-11-07 (×4): qty 1

## 2015-11-07 MED ORDER — METHYLPREDNISOLONE SODIUM SUCC 125 MG IJ SOLR
60.0000 mg | Freq: Three times a day (TID) | INTRAMUSCULAR | Status: DC
  Administered 2015-11-07 – 2015-11-08 (×3): 60 mg via INTRAVENOUS
  Filled 2015-11-07 (×4): qty 2

## 2015-11-07 MED ORDER — POLYETHYLENE GLYCOL 3350 17 G PO PACK
17.0000 g | PACK | Freq: Every day | ORAL | Status: DC | PRN
  Administered 2015-11-08 – 2015-11-09 (×2): 17 g via ORAL
  Filled 2015-11-07 (×2): qty 1

## 2015-11-07 MED ORDER — IPRATROPIUM-ALBUTEROL 0.5-2.5 (3) MG/3ML IN SOLN: 3.0000 mL | Freq: Four times a day (QID) | RESPIRATORY_TRACT | Status: DC

## 2015-11-07 MED ORDER — ENOXAPARIN SODIUM 40 MG/0.4ML ~~LOC~~ SOLN
40.0000 mg | SUBCUTANEOUS | Status: DC
  Administered 2015-11-07 – 2015-11-10 (×4): 40 mg via SUBCUTANEOUS
  Filled 2015-11-07 (×4): qty 0.4

## 2015-11-07 MED ORDER — ASPIRIN EC 81 MG PO TBEC
81.0000 mg | DELAYED_RELEASE_TABLET | Freq: Every day | ORAL | Status: DC
Start: 2015-11-07 — End: 2015-11-10
  Administered 2015-11-07 – 2015-11-10 (×4): 81 mg via ORAL
  Filled 2015-11-07 (×4): qty 1

## 2015-11-07 MED ORDER — VANCOMYCIN HCL IN DEXTROSE 1-5 GM/200ML-% IV SOLN
1000.0000 mg | Freq: Two times a day (BID) | INTRAVENOUS | Status: DC
  Administered 2015-11-07 – 2015-11-08 (×2): 1000 mg via INTRAVENOUS
  Filled 2015-11-07 (×2): qty 200

## 2015-11-07 MED ORDER — IPRATROPIUM-ALBUTEROL 0.5-2.5 (3) MG/3ML IN SOLN
3.0000 mL | RESPIRATORY_TRACT | Status: DC | PRN
  Administered 2015-11-07 (×2): 3 mL via RESPIRATORY_TRACT
  Filled 2015-11-07 (×2): qty 3

## 2015-11-07 MED ORDER — INFLUENZA VAC SPLIT QUAD 0.5 ML IM SUSY
0.5000 mL | PREFILLED_SYRINGE | INTRAMUSCULAR | Status: AC
  Administered 2015-11-09: 0.5 mL via INTRAMUSCULAR
  Filled 2015-11-07: qty 0.5

## 2015-11-07 MED ORDER — ALPRAZOLAM 0.5 MG PO TABS
0.5000 mg | ORAL_TABLET | Freq: Four times a day (QID) | ORAL | Status: DC | PRN
  Administered 2015-11-07 – 2015-11-10 (×11): 0.5 mg via ORAL
  Filled 2015-11-07 (×12): qty 1

## 2015-11-07 MED ORDER — ALBUTEROL SULFATE (2.5 MG/3ML) 0.083% IN NEBU
2.5000 mg | INHALATION_SOLUTION | Freq: Once | RESPIRATORY_TRACT | Status: AC
  Administered 2015-11-07: 2.5 mg via RESPIRATORY_TRACT
  Filled 2015-11-07: qty 3

## 2015-11-07 MED ORDER — SALINE SPRAY 0.65 % NA SOLN
1.0000 | NASAL | Status: DC | PRN
  Filled 2015-11-07: qty 44

## 2015-11-07 MED ORDER — GUAIFENESIN ER 600 MG PO TB12: 1200.0000 mg | ORAL_TABLET | Freq: Two times a day (BID) | ORAL | Status: DC

## 2015-11-07 MED ORDER — MOMETASONE FURO-FORMOTEROL FUM 200-5 MCG/ACT IN AERO
2.0000 | INHALATION_SPRAY | Freq: Two times a day (BID) | RESPIRATORY_TRACT | Status: DC
  Administered 2015-11-07 – 2015-11-09 (×5): 2 via RESPIRATORY_TRACT
  Filled 2015-11-07: qty 8.8

## 2015-11-07 MED ORDER — PANTOPRAZOLE SODIUM 40 MG PO TBEC
40.0000 mg | DELAYED_RELEASE_TABLET | Freq: Two times a day (BID) | ORAL | Status: DC
Start: 2015-11-07 — End: 2015-11-10
  Administered 2015-11-07 – 2015-11-10 (×7): 40 mg via ORAL
  Filled 2015-11-07 (×7): qty 1

## 2015-11-07 MED ORDER — SODIUM CHLORIDE 0.9% FLUSH
3.0000 mL | Freq: Two times a day (BID) | INTRAVENOUS | Status: DC
  Administered 2015-11-07 – 2015-11-09 (×4): 3 mL via INTRAVENOUS

## 2015-11-07 MED ORDER — SODIUM CHLORIDE 0.9 % IV SOLN: 250.0000 mL | INTRAVENOUS | Status: DC | PRN

## 2015-11-07 NOTE — Progress Notes (Signed)
Occupational Therapy Evaluation Patient Details Name: [name] Morningddie Cothran Jr. MRN: [id number] DOB: 09/16/1940 Today's Date: 11/07/2015    History of Present Illness 75 y.o. male with medical history significant for chronic diastolic CHF, and anxiety who presents to the emergency department from his SNF with acute respiratory distress. Patient was just discharged from the hospital yesterday after a 3 day admission for management of fatigue, loss of appetite, and AKI   Clinical Impression   Patient just evaluated by OT on Friday, then d/c to SNF, then returned to hospital next day. OT recommendations are to return to SNF for rehab. OT will follow.    Follow Up Recommendations  SNF    Equipment Recommendations  Other (comment) (tbd next venue of care)    Recommendations for Other Services PT consult     Precautions / Restrictions Precautions Precautions: Fall Precaution Comments: O2 dependent Restrictions Weight Bearing Restrictions: No      Mobility Bed Mobility                  Transfers                      Balance                                            ADL Overall ADL's : Needs assistance/impaired Eating/Feeding: Set up;Bed level   Grooming: Wash/dry hands;Wash/dry face;Set up;Bed level                     Toilet Transfer Details (indicate cue type and reason): setup to use urinal         Functional mobility during ADLs:  (pt declined EOB/OOB on eval) General ADL Comments: Patient was just evaluated by OT on Friday, d/c to SNF, returned next day. On eval today, pt reports SOB and did not want to get EOB/OOB. Pt set up for urinal use and is able to do feeding/grooming in the bed. Will follow.     Vision     Perception     Praxis      Pertinent Vitals/Pain Pain Assessment: Faces Faces Pain Scale: Hurts a little bit Pain Location: back, L side Pain Descriptors / Indicators: Sore Pain Intervention(s): Limited  activity within patient's tolerance;Premedicated before session     Hand Dominance Right   Extremity/Trunk Assessment Upper Extremity Assessment Upper Extremity Assessment: Generalized weakness   Lower Extremity Assessment Lower Extremity Assessment: Defer to PT evaluation       Communication Communication Communication: No difficulties   Cognition Arousal/Alertness: Awake/alert Behavior During Therapy: WFL for tasks assessed/performed Overall Cognitive Status: Impaired/Different from baseline                 General Comments: pt with poor judgement (requiring more assist, not using RW) however daughter reports this is baseline   General Comments       Exercises       Shoulder Instructions      Home Living Family/patient expects to be discharged to:: Skilled nursing facility Living Arrangements: Alone Available Help at Discharge: Family;Available PRN/intermittently Type of Home: House Home Access: Stairs to enter Entergy CorporationEntrance Stairs-Number of Steps: 3 Entrance Stairs-Rails: Left Home Layout: One level     Bathroom Shower/Tub: Producer, television/film/videoWalk-in shower   Bathroom Toilet: Standard     Home Equipment: Emergency planning/management officerhower seat;Walker - 4 wheels;Other (comment)  Additional Comments: 3L home O2      Prior Functioning/Environment Level of Independence: Independent        Comments: daughter reports pt does not use RW in home due to space (she is suggestive of hoarding), typically has daughter checking in and assisting as needed.        OT Problem List: Impaired balance (sitting and/or standing);Cardiopulmonary status limiting activity;Decreased activity tolerance;Decreased knowledge of use of DME or AE;Decreased safety awareness   OT Treatment/Interventions: Self-care/ADL training;DME and/or AE instruction;Therapeutic activities;Patient/family education;Energy conservation    OT Goals(Current goals can be found in the care plan section) Acute Rehab OT Goals Patient Stated Goal:  none stated OT Goal Formulation: With patient Time For Goal Achievement: 11/21/15 Potential to Achieve Goals: Good ADL Goals Pt Will Perform Upper Body Bathing: with min assist Pt Will Perform Lower Body Bathing: with min assist;sit to/from stand Pt Will Perform Upper Body Dressing: with min assist;sitting Pt Will Perform Lower Body Dressing: with min assist;sit to/from stand Pt Will Transfer to Toilet: with min assist;bedside commode Pt Will Perform Toileting - Clothing Manipulation and hygiene: with min assist;sit to/from stand Additional ADL Goal #1: Patient will utilize 3 energy conservation techniques during BADL without reminders  OT Frequency: Min 2X/week   Barriers to D/C: Decreased caregiver support          Co-evaluation              End of Session Equipment Utilized During Treatment: Oxygen  Activity Tolerance: Patient limited by pain;Patient limited by fatigue Patient left: in bed;with call bell/phone within reach   Time: 0865-7846 OT Time Calculation (min): 10 min Charges:  OT General Charges $OT Visit: 1 Procedure OT Evaluation $OT Eval Low Complexity: 1 Procedure G-Codes:    Tenika Keeran A 15-Nov-2015, 9:28 AM

## 2015-11-07 NOTE — Evaluation (Signed)
Physical Therapy Evaluation Patient Details Name: [name] [name]. MRN: [id number] DOB: [birth year] Today's Date: 11/07/2015   History of Present Illness  75 y.o. male with medical history significant for chronic diastolic CHF, and anxiety who presents to the emergency department 11/07/15 from his SNF with acute respiratory distress. Patient was just discharged from the hospital 9/30.after a 3 day admission for management of fatigue, loss of appetite, and AKI  Clinical Impression      Follow Up Recommendations SNF;Supervision/Assistance - 24 hour    Equipment Recommendations  None recommended by PT    Recommendations for Other Services       Precautions / Restrictions Precautions Precautions: Fall Precaution Comments: O2 dependent      Mobility  Bed Mobility Overal bed mobility: Needs Assistance Bed Mobility: Supine to Sit;Sit to Supine     Supine to sit: Supervision;HOB elevated Sit to supine: Supervision;HOB elevated   General bed mobility comments: increased time and effort due t dyspnea  Transfers Overall transfer level: Needs assistance Equipment used:  (back of chair) Transfers: Sit to/from Stand Sit to Stand: Min assist;+2 safety/equipment         General transfer comment: stood holding onto back of chair. 4 side steps along the bed with mod steady assistance. Sats dropped  to 88% on 3.5 liters. Dyspnea 4/4 after return to supine.   Ambulation/Gait                Stairs            Wheelchair Mobility    Modified Rankin (Stroke Patients Only)       Balance Overall balance assessment: History of Falls;Needs assistance Sitting-balance support: Feet supported;No upper extremity supported Sitting balance-Leahy Scale: Fair     Standing balance support: During functional activity;Bilateral upper extremity supported Standing balance-Leahy Scale: Poor                               Pertinent Vitals/Pain Faces Pain Scale: Hurts  a little bit Pain Location: back Pain Descriptors / Indicators: Sore Pain Intervention(s): Limited activity within patient's tolerance;Monitored during session;Patient requesting pain meds-RN notified    Home Living Family/patient expects to be discharged to:: Skilled nursing facility Living Arrangements: Alone   Type of Home: House Home Access: Stairs to enter       Home Equipment: Emergency planning/management officer - 4 wheels;Other (comment)      Prior Function                 Hand Dominance        Extremity/Trunk Assessment   Upper Extremity Assessment: Defer to OT evaluation           Lower Extremity Assessment: Generalized weakness      Cervical / Trunk Assessment: Kyphotic  Communication      Cognition Arousal/Alertness: Awake/alert Behavior During Therapy: WFL for tasks assessed/performed Overall Cognitive Status: Impaired/Different from baseline Area of Impairment: Safety/judgement         Safety/Judgement: Decreased awareness of safety;Decreased awareness of deficits          General Comments      Exercises     Assessment/Plan    PT Assessment Patient needs continued PT services  PT Problem List Decreased strength;Decreased activity tolerance;Decreased mobility;Decreased balance;Cardiopulmonary status limiting activity;Decreased safety awareness          PT Treatment Interventions DME instruction;Gait training;Functional mobility training;Therapeutic activities;Therapeutic exercise;Balance training;Patient/family education    PT Goals (  Current goals can be found in the Care Plan section)  Acute Rehab PT Goals Patient Stated Goal: to get back to therapy PT Goal Formulation: With patient Time For Goal Achievement: 11/21/15 Potential to Achieve Goals: Good    Frequency Min 3X/week   Barriers to discharge        Co-evaluation               End of Session Equipment Utilized During Treatment: Oxygen Activity Tolerance: Patient  limited by fatigue;Treatment limited secondary to medical complications (Comment) Patient left: in bed;with call bell/phone within reach;with bed alarm set Nurse Communication: Mobility status;Patient requests pain meds         Time: [id number] PT Time Calculation (min) (ACUTE ONLY): 12 min   Charges:   PT Evaluation $PT Eval Low Complexity: 1 Procedure     PT G CodesSharen Heck:        Kamaya Keckler Elizabeth Judit Awad PT [phone]  11/07/2015, 3:24 PM

## 2015-11-07 NOTE — Progress Notes (Signed)
PROGRESS NOTE                                                                                                                                                                                                             Patient Demographics:    [name] [name], is a 75 y.o. male, DOB - 27-Jan-1941, JXB:[id number]  Admit date - 11/07/2015   Admitting Physician [name] Deutscher, MD  Outpatient Primary MD for the patient is Millsaps, [name] Millin, NP  LOS - 0   Chief Complaint  Patient presents with  . Shortness of Breath       Brief Narrative   76 year old male admitted earlier during the day by my colleague, charts, labs and imaging were reviewed, agent was discharged yesterday to SNF, presents with shortness of breath, cough, workup significant for HCAP.   Subjective:    [name] [name] today has, No headache, No chest pain, No abdominal pain - No Nausea, Reports cough, nonproductive. Dyspnea at baseline.  Assessment  & Plan :    Principal Problem:   HCAP (healthcare-associated pneumonia) Active Problems:   Essential hypertension   GERD   Acute on chronic respiratory failure with hypoxia (HCC)   COPD exacerbation (HCC)   Normocytic anemia   Chronic diastolic CHF (congestive heart failure) (HCC)   HCAP  - Presents with new productive cough, increased O2-requirement, new leukocytosis, and new infiltrates on CXR  - Lactic acid reassuring at 1.62 - Blood cultures obtained in ED, sputum culture and gram stain requested  - Empiric vancomycin and cefepime started in ED, will continue  - Check urine for strep pneumo and legionella antigens   Chronic respiratory failure - patient  reports he is on 3.5 L nasal cannula at baseline   COPD with acute exacerbation  - Has chronic O2 requirement; presents with acute exacerbation precipitated by the infection  - 125 mg Solu-Medrol given in ED, will continue at 60 mg q8h for now  - DuoNeb q4h, continue  Dulera; antibiotics as above  - Monitor with continuous pulse oximetry and titrate FiO2 to keep sat low-mid 90's   Chronic diastolic CHF  - Appears a little volume-up on admission  - TTE (08/20/15) with EF 55-60%, grade 1 diastolic dysfunction, no significant valvular disease  - Lasix held at time of admission given acute infection; resume as appropriate -  SLIV, follow daily weights and strict I/O's, fluid-restrict diet   Normocytic anemia  - Hgb 10.3 on admission and stable with no s/s of active bleeding  - Attributed to iron-deficiency, will continue supplementation    GERD - Stable, continue Protonix qD      Code Status : Full  Family Communication  : none at bedside  Disposition Plan  : back to SNF  Consults  :  none  Procedures  : None  DVT Prophylaxis  :  Lovenox  Lab Results  Component Value Date   PLT 304 11/07/2015    Antibiotics  :    Anti-infectives    Start     Dose/Rate Route Frequency Ordered Stop   11/07/15 1800  vancomycin (VANCOCIN) IVPB 1000 mg/200 mL premix     1,000 mg 200 mL/hr over 60 Minutes Intravenous Every 12 hours 11/07/15 0545     11/07/15 1400  ceFEPIme (MAXIPIME) 1 g in dextrose 5 % 50 mL IVPB     1 g 100 mL/hr over 30 Minutes Intravenous Every 8 hours 11/07/15 0545     11/07/15 0345  vancomycin (VANCOCIN) IVPB 1000 mg/200 mL premix     1,000 mg 200 mL/hr over 60 Minutes Intravenous  Once 11/07/15 0338 11/07/15 0517   11/07/15 0345  ceFEPIme (MAXIPIME) 2 g in dextrose 5 % 50 mL IVPB     2 g 100 mL/hr over 30 Minutes Intravenous  Once 11/07/15 0338 11/07/15 0432        Objective:   Vitals:   11/07/15 0430 11/07/15 0518 11/07/15 0830 11/07/15 0831  BP: 111/61 (!) 152/79    Pulse: 98 (!) 104    Resp: 23 (!) 28    Temp:  98.6 F (37 C)    TempSrc:  Axillary    SpO2: 94% 97% 93% 93%  Weight:  76.6 kg (168 lb 12.8 oz)    Height:  5' 7.5" (1.715 m)      Wt Readings from Last 3 Encounters:  11/07/15 76.6 kg (168 lb 12.8  oz)  11/06/15 74.9 kg (165 lb 2 oz)  10/27/15 77 kg (169 lb 12.1 oz)     Intake/Output Summary (Last 24 hours) at 11/07/15 1156 Last data filed at 11/07/15 0700  Gross per 24 hour  Intake              220 ml  Output                0 ml  Net              220 ml     Physical Exam  Awake Alert, Oriented X 3, Frail Supple Neck,No JVD,   Symmetrical Chest wall movement, Good air movement bilaterally, CTAB, no wheezing RRR,No Gallops,Rubs or new Murmurs, No Parasternal Heave +ve B.Sounds, Abd Soft, No tenderness,  No Cyanosis, Clubbing or edema, No new Rash or bruise      Data Review:    CBC  Recent Labs Lab 11/03/15 1050 11/04/15 0456 11/07/15 0243  WBC 10.5 8.1 12.6*  HGB 10.7* 9.1* 10.3*  HCT 36.7* 30.7* 35.7*  PLT 352 302 304  MCV 87.0 85.0 88.8  MCH 25.4* 25.2* 25.6*  MCHC 29.2* 29.6* 28.9*  RDW 17.1* 17.1* 17.5*  LYMPHSABS 0.7 0.7 1.0  MONOABS 0.9 0.5 0.9  EOSABS 0.0 0.1 0.1  BASOSABS 0.0 0.0 0.0    Chemistries   Recent Labs Lab 11/03/15 1050 11/03/15 1722 11/04/15 0456 11/05/15 0512 11/07/15 0243  NA 134*  --  135 136 138  K 2.3*  --  2.8* 3.1* 4.2  CL 79*  --  90* 93* 97*  CO2 42*  --  38* 34* 34*  GLUCOSE 126*  --  85 94 108*  BUN 39*  --  19 14 12   CREATININE 1.57*  --  0.90 0.68 0.74  CALCIUM 9.8  --  8.5* 8.9 9.1  MG  --  2.4  --  1.8  --   AST 27  --   --   --  23  ALT 18  --   --   --  16*  ALKPHOS 84  --   --   --  82  BILITOT 0.5  --   --   --  0.7   ------------------------------------------------------------------------------------------------------------------ No results for input(s): CHOL, HDL, LDLCALC, TRIG, CHOLHDL, LDLDIRECT in the last 72 hours.  Lab Results  Component Value Date   HGBA1C 5.9 (H) 08/20/2015   ------------------------------------------------------------------------------------------------------------------ No results for input(s): TSH, T4TOTAL, T3FREE, THYROIDAB in the last 72 hours.  Invalid input(s):  FREET3 ------------------------------------------------------------------------------------------------------------------ No results for input(s): VITAMINB12, FOLATE, FERRITIN, TIBC, IRON, RETICCTPCT in the last 72 hours.  Coagulation profile  Recent Labs Lab 11/07/15 0608  INR 0.96    No results for input(s): DDIMER in the last 72 hours.  Cardiac Enzymes  Recent Labs Lab 11/03/15 1722 11/03/15 2349 11/04/15 0456  TROPONINI 0.06* 0.04* 0.05*   ------------------------------------------------------------------------------------------------------------------    Component Value Date/Time   BNP 168.8 (H) 11/07/2015 0243    Inpatient Medications  Scheduled Meds: . aspirin EC  81 mg Oral Daily  . atorvastatin  40 mg Oral q1800  . ceFEPime (MAXIPIME) IV  1 g Intravenous Q8H  . enoxaparin (LOVENOX) injection  40 mg Subcutaneous Q24H  . [START ON 11/08/2015] ferrous sulfate  325 mg Oral Once per day on Mon Wed Fri  . [START ON 11/08/2015] Influenza vac split quadrivalent PF  0.5 mL Intramuscular Tomorrow-1000  . methylPREDNISolone (SOLU-MEDROL) injection  60 mg Intravenous Q8H  . mometasone-formoterol  2 puff Inhalation BID  . pantoprazole  40 mg Oral BID  . sodium chloride flush  3 mL Intravenous Q12H  . vancomycin  1,000 mg Intravenous Q12H   Continuous Infusions:  PRN Meds:.sodium chloride, acetaminophen, ALPRAZolam, dextromethorphan-guaiFENesin, ipratropium-albuterol, polyethylene glycol, sodium chloride, sodium chloride flush, traMADol  Micro Results Recent Results (from the past 240 hour(s))  MRSA PCR Screening     Status: None   Collection Time: 11/07/15  5:28 AM  Result Value Ref Range Status   MRSA by PCR NEGATIVE NEGATIVE Final    Comment:        The GeneXpert MRSA Assay (FDA approved for NASAL specimens only), is one component of a comprehensive MRSA colonization surveillance program. It is not intended to diagnose MRSA infection nor to guide or monitor  treatment for MRSA infections.     Radiology Reports Dg Chest 2 View  Result Date: 11/07/2015 CLINICAL DATA:  Shortness of breath starting at 0100 hours this morning. EXAM: CHEST  2 VIEW COMPARISON:  11/04/2015 FINDINGS: Normal heart size and pulmonary vascularity. Shallow inspiration. Small bilateral pleural effusions. Developing consolidation in the lung bases, greater on the left. Pulmonary fibrosis with peribronchial thickening suggesting chronic bronchitis. No pneumothorax. IMPRESSION: Small bilateral pleural effusions with increasing basilar atelectasis or consolidation. Changes may indicate pneumonia. Fibrosis and chronic bronchitic changes in the lungs. Electronically Signed   By: Burman Nieves M.D.   On: 11/07/2015 03:12  Dg Chest 2 View  Result Date: 11/03/2015 CLINICAL DATA:  Shortness of breath.  Multiple recent falls EXAM: CHEST  2 VIEW COMPARISON:  October 25, 2015 and May 06, 2015 Findings coal Scarring is noted bilaterally, most notably in the right mid lung and left base regions. There is somewhat diffuse interstitial thickening which is stable. There is no frank edema or consolidation. Heart size and pulmonary vascularity are normal. There is atherosclerotic calcification in the aorta. No adenopathy. Anterior wedging of several vertebral bodies stable. There is mild new anterior wedging of the T6 vertebral body. IMPRESSION: Mild anterior wedging of the T6 vertebral body, new from most recent study. Anterior wedging of other thoracic vertebral bodies is stable. Anterior wedging is most marked at T12. There is chronic scarring and interstitial thickening, stable. No edema or consolidation. Stable cardiac silhouette. Electronically Signed   By: Bretta Bang III M.D.   On: 11/03/2015 11:50   Dg Chest 2 View  Result Date: 10/25/2015 CLINICAL DATA:  Shortness of Breath EXAM: CHEST  2 VIEW COMPARISON:  05/06/2015 FINDINGS: Cardiac shadow is within normal limits. Diffuse  interstitial changes are again identified but stable from the prior exam consistent with underlying scarring. No focal infiltrate or sizable effusion is seen. Compression deformities are again noted in the mid and lower thoracic spine. Some of these have progressed in the interval from the prior exam. Correlation with point tenderness is recommended. IMPRESSION: Chronic interstitial changes. Compression deformities of the thoracic spine which have progressed in the interval from the prior exam. Correlation to point tenderness is recommended. Electronically Signed   By: Alcide Clever M.D.   On: 10/25/2015 15:46   Dg Lumbar Spine 2-3 Views  Result Date: 10/26/2015 CLINICAL DATA:  Fall. EXAM: LUMBAR SPINE - 2-3 VIEW COMPARISON:  CT 08/19/2015. FINDINGS: T12 compression fracture is noted. Compression fractures prominent. This appears new from prior CT of 08/19/2015. Diffuse osteopenia and degenerative change. Moderate gastric distention noted. IMPRESSION: 1. T12 prominent compression fracture noted. This is new from CT of 08/19/2015. T9 compression fracture cannot be excluded. 2. Diffuse osteopenia degenerative change. 3. Moderate gastric distention. Electronically Signed   By: Maisie Fus  Register   On: 10/26/2015 12:15   Dg Pelvis 1-2 Views  Result Date: 10/25/2015 CLINICAL DATA:  Multiple falls, bilateral pelvic tenderness EXAM: PELVIS - 1-2 VIEW COMPARISON:  CT abdomen pelvis dated 08/19/2015 FINDINGS: No fracture or dislocation is seen. Bilateral hip joint spaces are preserved. Visualized bony pelvis appears intact. Mild degenerative changes of the lower lumbar spine. IMPRESSION: No fracture or dislocation is seen. Electronically Signed   By: Charline Bills M.D.   On: 10/25/2015 13:41   Portable Chest 1 View  Result Date: 11/04/2015 CLINICAL DATA:  Weakness and shortness of breath EXAM: PORTABLE CHEST 1 VIEW COMPARISON:  PA and lateral chest x-ray of November 03, 2015 FINDINGS: The lungs are adequately  inflated. The interstitial markings are increased bilaterally. There is no alveolar infiltrate or pleural effusion. The cardiac silhouette is mildly enlarged though stable. The pulmonary vascularity is prominent centrally. There are bibasilar densities consistent with atelectasis or early infiltrate. IMPRESSION: COPD with pulmonary fibrotic changes. Superimposed bibasilar atelectasis or pneumonia. Mild pulmonary vascular congestion suggests low-grade CHF. Electronically Signed   By: David  [place] M.D.   On: 11/04/2015 07:21       Brandyn Lowrey M.D on 11/07/2015 at 11:56 AM  Between 7am to 7pm - Pager - [phone]  After 7pm go to [email or web address] - password [email or web address]  [facility] -  Office  [phone]

## 2015-11-07 NOTE — ED Provider Notes (Signed)
[facility]-EMERGENCY DEPT Provider Note   CSN: [id number] Arrival date & time: 11/07/15  0203  By signing my name below, I, Christy Sartorius, attest that this documentation has been prepared under the direction and in the presence of Gilda Crease, MD . Electronically Signed: Christy Sartorius, Scribe. 11/07/2015. 2:26 AM.  History   Chief Complaint Chief Complaint  Patient presents with  . Shortness of Breath    The history is provided by the patient and medical records. No language interpreter was used.   HPI Comments:  [name] [name]. is a 75 y.o. male with PmHx COPD and congestive heart failure brought in by ambulance who presents to the Emergency Department complaining of SOB, cough and wheezing onset 1000 yesterday.  Pt was discharged from the ED yesterday and diagnosed with COPD Exacerbation with hypokalemia.  No alleviating factors noted.  No additional injury or complaint.    Past Medical History:  Diagnosis Date  . Acute respiratory failure with hypoxia (HCC) 01/2014  . Chronic bronchitis    "get it q yr" (07/17/2013)  . Chronic diastolic CHF (congestive heart failure) (HCC)   . COPD (chronic obstructive pulmonary disease) (HCC)   . Daily headache   . DVT (deep venous thrombosis) (HCC) 1990's   "?LLE"  . GERD (gastroesophageal reflux disease)    "related to RX I take" (07/17/2013)  . Hypertension   . Migraine    "all my life; no really bad migraines in 8-9 since work stress is gone" (07/17/2013)  . On home oxygen therapy    "2L 24/7" (08/19/2015)  . Pneumonia    "several times"    Patient Active Problem List   Diagnosis Date Noted  . Acute kidney injury (HCC) 11/03/2015  . Hyponatremia 11/03/2015  . Falls   . Shortness of breath 10/25/2015  . Abdominal pain 08/19/2015  . Elevated troponin 08/19/2015  . Chest pressure 08/19/2015  . Diastolic CHF, acute on chronic (HCC)   . Troponin level elevated   . B12 deficiency 05/12/2015  . Iron deficiency anemia    . Abdominal distension   . COPD exacerbation (HCC) 05/06/2015  . Influenza B 05/06/2015  . Cataract of right eye 05/06/2015  . COPD (chronic obstructive pulmonary disease) (HCC) 05/09/2014  . Acute on chronic respiratory failure with hypoxia (HCC) 05/09/2014  . Influenza A H1N1 infection 05/09/2014  . AKI (acute kidney injury) (HCC) 05/09/2014  . Dehydration 05/09/2014  . Lobar pneumonia due to unspecified organism 05/05/2014  . Tobacco abuse 01/19/2014  . History of DVT (deep vein thrombosis)   . On home oxygen therapy   . Hypokalemia 10/29/2013  . GERD 12/25/2008  . Essential hypertension 11/09/2008  . 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 CATARACT EXTRACTION W/ INTRAOCULAR LENS IMPLANT Right 05/05/2015  . INGUINAL HERNIA REPAIR Left ~ 1990  . TONSILLECTOMY  1949       Home Medications    Prior to Admission medications   Medication Sig Start Date End Date Taking? Authorizing Provider  acetaminophen (TYLENOL) 325 MG tablet Take 2 tablets (650 mg total) by mouth every 6 (six) hours as needed for mild pain (or Fever >/= 101). 11/06/15  Yes Alison Murray, MD  albuterol (ACCUNEB) 0.63 MG/3ML nebulizer solution Take 1 ampule by nebulization every 6 (six) hours as needed for wheezing.   Yes Historical Provider, MD  albuterol (PROVENTIL HFA;VENTOLIN HFA) 108 (90 BASE) MCG/ACT inhaler Inhale  2 puffs into the lungs every 6 (six) hours as needed for wheezing or shortness of breath. 04/13/14  Yes Storm Frisk, MD  ALPRAZolam Prudy Feeler) 0.5 MG tablet Take 1 tablet (0.5 mg total) by mouth 4 (four) times daily as needed for anxiety. 11/06/15  Yes Alison Murray, MD  aspirin EC 81 MG EC tablet Take 1 tablet (81 mg total) by mouth daily. 10/28/15  Yes Wendee Beavers, DO  atorvastatin (LIPITOR) 40 MG tablet Take 1 tablet (40 mg total) by mouth daily at 6 PM. 10/27/15  Yes Wendee Beavers, DO    Calcium Carbonate Antacid (ANTACID PO) Take 1 tablet by mouth every 4 (four) hours as needed (for acid reflux or heartburn).    Yes Historical Provider, MD  dextromethorphan-guaiFENesin (MUCINEX DM) 30-600 MG 12hr tablet Take 1 tablet by mouth 2 (two) times daily. Patient taking differently: Take 1 tablet by mouth 2 (two) times daily as needed for cough.  05/10/15  Yes Ruben Im, MD  Ferrous Sulfate (IRON) 142 (45 Fe) MG TBCR Take 1 tablet by mouth 3 (three) times a week.   Yes Historical Provider, MD  furosemide (LASIX) 40 MG tablet Take 1 tablet (40 mg total) by mouth daily. 11/06/15  Yes Alison Murray, MD  ipratropium-albuterol (DUONEB) 0.5-2.5 (3) MG/3ML SOLN Take 3 mLs by nebulization every 6 (six) hours as needed. Patient taking differently: Take 3 mLs by nebulization every 6 (six) hours as needed (for wheezing or shortness of breath).  05/12/14  Yes Albertine Grates, MD  mometasone-formoterol [facility]) 200-5 MCG/ACT AERO Inhale 2 puffs into the lungs 2 (two) times daily.   Yes Historical Provider, MD  ondansetron (ZOFRAN) 8 MG tablet Take 8 mg by mouth every 8 (eight) hours as needed for nausea or vomiting.  08/19/15  Yes Historical Provider, MD  pantoprazole (PROTONIX) 40 MG tablet Take 1 tablet (40 mg total) by mouth 2 (two) times daily. 11/01/13  Yes Vassie Loll, MD  polyethylene glycol [facility] / GLYCOLAX) packet Take 17 g by mouth daily as needed for mild constipation. 08/23/15  Yes Zannie Cove, MD  sodium chloride (OCEAN) 0.65 % SOLN nasal spray Place 1 spray into the nose as needed for congestion (Dry nasal passages).   Yes Historical Provider, MD  SPIRIVA HANDIHALER 18 MCG inhalation capsule Place 1 puff into inhaler and inhale daily as needed for wheezing. 09/07/15  Yes Historical Provider, MD  traMADol (ULTRAM) 50 MG tablet Take 1 tablet (50 mg total) by mouth daily as needed for moderate pain. 11/06/15  Yes Alison Murray, MD  vitamin E 100 UNIT capsule Take 100 Units by mouth daily as needed.  supplement   Yes Historical Provider, MD  potassium chloride SA (K-DUR,KLOR-CON) 10 MEQ tablet Take 1 tablet (10 mEq total) by mouth daily. 11/06/15   Alison Murray, MD    Family History Family History  Problem Relation Age of Onset  . Breast cancer Mother   . Alzheimer's disease Father     Social History Social History  Substance Use Topics  . Smoking status: Former Smoker    Packs/day: 1.00    Years: 10.00    Types: Cigarettes    Quit date: 02/06/1998  . Smokeless tobacco: Never Used  . Alcohol use No     Allergies   Adhesive [tape]; Advair diskus [fluticasone-salmeterol]; Brovana [arformoterol]; Codeine; Ferrous sulfate; Other; and Prednisone   Review of Systems Review of Systems  Respiratory: Positive for shortness of breath.   All  other systems reviewed and are negative.    Physical Exam Updated Vital Signs BP 115/68   Pulse 107   Temp 99 F (37.2 C) (Oral)   Resp (!) 31   SpO2 100%   Physical Exam  Constitutional: He is oriented to person, place, and time. He appears well-developed and well-nourished. No distress.  HENT:  Head: Normocephalic and atraumatic.  Right Ear: Hearing normal.  Left Ear: Hearing normal.  Nose: Nose normal.  Mouth/Throat: Oropharynx is clear and moist and mucous membranes are normal.  Eyes: Conjunctivae and EOM are normal. Pupils are equal, round, and reactive to light.  Neck: Normal range of motion. Neck supple.  Cardiovascular: Regular rhythm, S1 normal and S2 normal.  Tachycardia present.  Exam reveals no gallop and no friction rub.   No murmur heard. Pulmonary/Chest: Effort normal. Tachypnea noted. No respiratory distress. He exhibits no tenderness.  Breath sounds decreased with slight wheezes at the bases.  Abdominal: Soft. Normal appearance and bowel sounds are normal. There is no hepatosplenomegaly. There is no tenderness. There is no rebound, no guarding, no tenderness at McBurney's point and negative Murphy's sign. No  hernia.  Musculoskeletal: Normal range of motion.  Neurological: He is alert and oriented to person, place, and time. He has normal strength. No cranial nerve deficit or sensory deficit. Coordination normal. GCS eye subscore is 4. GCS verbal subscore is 5. GCS motor subscore is 6.  Skin: Skin is warm, dry and intact. No rash noted. No cyanosis.  Psychiatric: He has a normal mood and affect. His speech is normal and behavior is normal. Thought content normal.  Nursing note and vitals reviewed.    ED Treatments / Results   DIAGNOSTIC STUDIES:  Oxygen Saturation is 100% on non-rebreather, NML by my interpretation.    COORDINATION OF CARE:  2:26 AM Discussed treatment plan with pt at bedside and pt agreed to plan.  Labs (all labs ordered are listed, but only abnormal results are displayed) Labs Reviewed  CBC WITH DIFFERENTIAL/PLATELET - Abnormal; Notable for the following:       Result Value   WBC 12.6 (*)    RBC 4.02 (*)    Hemoglobin 10.3 (*)    HCT 35.7 (*)    MCH 25.6 (*)    MCHC 28.9 (*)    RDW 17.5 (*)    Neutro Abs 10.6 (*)    All other components within normal limits  COMPREHENSIVE METABOLIC PANEL - Abnormal; Notable for the following:    Chloride 97 (*)    CO2 34 (*)    Glucose, Bld 108 (*)    Total Protein 6.0 (*)    Albumin 2.9 (*)    ALT 16 (*)    All other components within normal limits  BRAIN NATRIURETIC PEPTIDE - Abnormal; Notable for the following:    B Natriuretic Peptide 168.8 (*)    All other components within normal limits  CULTURE, BLOOD (ROUTINE X 2)  CULTURE, BLOOD (ROUTINE X 2)  I-STAT TROPOININ, ED  I-STAT CG4 LACTIC ACID, ED    EKG  EKG Interpretation  Date/Time:  Sunday November 07 2015 02:21:12 EDT Ventricular Rate:  109 PR Interval:    QRS Duration: 107 QT Interval:  331 QTC Calculation: 446 R Axis:   84 Text Interpretation:  Sinus tachycardia Supraventricular bigeminy Borderline right axis deviation Low voltage, precordial leads  RSR' in V1 or V2, probably normal variant No significant change since last tracing Confirmed by POLLINA  MD, CHRISTOPHER [phone]([id number]) on  11/07/2015 2:31:52 AM       Radiology Dg Chest 2 View  Result Date: 11/07/2015 CLINICAL DATA:  Shortness of breath starting at 0100 hours this morning. EXAM: CHEST  2 VIEW COMPARISON:  11/04/2015 FINDINGS: Normal heart size and pulmonary vascularity. Shallow inspiration. Small bilateral pleural effusions. Developing consolidation in the lung bases, greater on the left. Pulmonary fibrosis with peribronchial thickening suggesting chronic bronchitis. No pneumothorax. IMPRESSION: Small bilateral pleural effusions with increasing basilar atelectasis or consolidation. Changes may indicate pneumonia. Fibrosis and chronic bronchitic changes in the lungs. Electronically Signed   By: Burman Nieves M.D.   On: 11/07/2015 03:12    Procedures Procedures (including critical care time)  Medications Ordered in ED Medications - No data to display   Initial Impression / Assessment and Plan / ED Course  I have reviewed the triage vital signs and the nursing notes.  Pertinent labs & imaging results that were available during my care of the patient were reviewed by me and considered in my medical decision making (see chart for details).  Clinical Course    Patient presents to the emergency department for evaluation of difficulty breathing. Patient was hospitalized this week for dehydration and acute kidney injury. He reports going home yesterday. Since going home he has had increased cough, shortness of breath and wheezing. He does have a history of COPD. He does not appear to be volume overloaded. EKG and first troponin unremarkable. Patient's chest x-ray, however, does show evidence of bilateral lower lobe pneumonia, left greater than right. Patient will require hospitalization for further management. Initiated on broad-spectrum antibiotic coverage.  Final Clinical  Impressions(s) / ED Diagnoses   Final diagnoses:  HCAP (healthcare-associated pneumonia)    New Prescriptions New Prescriptions   No medications on file   I personally performed the services described in this documentation, which was scribed in my presence. The recorded information has been reviewed and is accurate.     Gilda Crease, MD 11/07/15 [phone]

## 2015-11-07 NOTE — Progress Notes (Signed)
Pharmacy Antibiotic Note  [name] [name]. is a 75 y.o. male admitted on 11/07/2015 with pneumonia.  Pharmacy has been consulted for Vancomycin, cefepime dosing.  Plan: Vancomycin 1gm IV every 12 hours.  Goal trough 15-20 mcg/mL.  Cefepime 1gm iv q8hr  Height: 5' 7.5" (171.5 cm) IBW/kg (Calculated) : 67.25  Temp (24hrs), Avg:98.8 F (37.1 C), Min:98.6 F (37 C), Max:99 F (37.2 C)   Recent Labs Lab 11/03/15 1050 11/04/15 0456 11/05/15 0512 11/07/15 0243 11/07/15 0354  WBC 10.5 8.1  --  12.6*  --   CREATININE 1.57* 0.90 0.68 0.74  --   LATICACIDVEN  --   --   --   --  1.62    Estimated Creatinine Clearance: 77.1 mL/min (by C-G formula based on SCr of 0.74 mg/dL).    Allergies  Allergen Reactions  . Adhesive [Tape]     Tears skin, prefers paper tape  . Advair Diskus [Fluticasone-Salmeterol] Other (See Comments)    Blurred vision  . Brovana [Arformoterol]     Ineffective--exacerbated coughing/congestion  . Codeine Other (See Comments)    Hallucinations, blurred vision  . Ferrous Sulfate Other (See Comments)    Stomach pain  . Other Swelling    Bicillin injection in 1961--swelling in lower extremities and joints, and whelps up arm.  Has taken penicillin since w/o reaction.  . Prednisone Other (See Comments)    Stomach pain    Antimicrobials this admission: Vancomycin 11/07/2015 >> Cefepime 11/07/2015 >>   Dose adjustments this admission: -  Microbiology results: pending  Thank you for allowing pharmacy to be a part of this patient's care.  [name] [name], [name] [name] 11/07/2015 5:46 AM

## 2015-11-07 NOTE — Progress Notes (Signed)
Resumed care of patient. Agree with previous assessment. Orders reviewed. Will continue to monitor. 

## 2015-11-07 NOTE — ED Notes (Signed)
Patient transported to X-ray 

## 2015-11-07 NOTE — ED Notes (Signed)
Bed: [id number] Expected date:  Expected time:  Means of arrival:  Comments: EMS 75 yo male SOB x 1 hour

## 2015-11-07 NOTE — H&P (Signed)
History and Physical    [name] [name]. ZOX:[id number] DOB: 05/29/1940 DOA: 11/07/2015  PCP: [name], NP   Patient coming from: SNF   Chief Complaint: SOB, hypoxia   HPI: [name] [name]. is a 75 y.o. male with medical history significant for chronic diastolic CHF, and anxiety who presents to the emergency department from his SNF with acute respiratory distress. Patient was just discharged from the hospital yesterday after a 3 day admission for management of fatigue, loss of appetite, and AKI. He reports development of a cough shortly after arriving back at the SNF and states that this has progressively worsened since that time. Cough has become productive of thick white sputum and the patient has been increasingly dyspneic. Patient was noted to be in acute respiratory distress at his nursing facility, a nebulized breathing treatment was administered, but the patient was seemingly unaffected. EMS was activated and he was placed on nonrebreather en route.  ED Course: Upon arrival to the ED, patient is found to be afebrile, saturating well on nonrebreather with 10 L/m, tachycardic in the 110s, and with vitals otherwise stable. EKG demonstrates sinus tachycardia with rate 109 and RSR'in V1 and V2. Chest x-ray is notable for small bilateral pleural effusions and increasing bibasilar atelectasis or consolidation, likely representing new pneumonia. CMP features a chronic elevation in serum bicarbonate 34 and albumin of 2.9. CBC is notable for a new leukocytosis to 12,600 and a stable normocytic anemia with hemoglobin of 10.3. Lactic acid is reassuring at 1.62, troponin is within normal limits, and BNP is mildly elevated 169. Blood cultures were obtained and the patient was treated with empiric vancomycin and cefepime. He was noted to have diminished breath sounds and wheezing on exam and this was treated with DuoNeb and 125 mg IV Solu-Medrol. Tachycardia improved and the patient enjoyed some  mild subjective improvement with the measures in the emergency department. He continues to be dyspneic at rest with increased supplemental oxygen requirement. He will be admitted to the telemetry unit for ongoing evaluation and management of acute on chronic hypoxic respiratory failure secondary to HCAP.   Review of Systems:  All other systems reviewed and apart from HPI, are negative.  Past Medical History:  Diagnosis Date  . Acute respiratory failure with hypoxia (HCC) 01/2014  . Chronic bronchitis    "get it q yr" (07/17/2013)  . Chronic diastolic CHF (congestive heart failure) (HCC)   . COPD (chronic obstructive pulmonary disease) (HCC)   . Daily headache   . DVT (deep venous thrombosis) (HCC) 1990's   "?LLE"  . GERD (gastroesophageal reflux disease)    "related to RX I take" (07/17/2013)  . Hypertension   . Migraine    "all my life; no really bad migraines in 8-9 since work stress is gone" (07/17/2013)  . On home oxygen therapy    "2L 24/7" (08/19/2015)  . Pneumonia    "several times"    Past Surgical History:  Procedure Laterality Date  . CATARACT EXTRACTION W/ INTRAOCULAR LENS IMPLANT Right 05/05/2015  . INGUINAL HERNIA REPAIR Left ~ 1990  . TONSILLECTOMY  1949     reports that he quit smoking about 17 years ago. His smoking use included Cigarettes. He has a 10.00 pack-year smoking history. He has never used smokeless tobacco. He reports that he does not drink alcohol or use drugs.  Allergies  Allergen Reactions  . Adhesive [Tape]     Tears skin, prefers paper tape  . Advair Diskus [Fluticasone-Salmeterol] Other (See  Comments)    Blurred vision  . Brovana [Arformoterol]     Ineffective--exacerbated coughing/congestion  . Codeine Other (See Comments)    Hallucinations, blurred vision  . Ferrous Sulfate Other (See Comments)    Stomach pain  . Other Swelling    Bicillin injection in 1961--swelling in lower extremities and joints, and whelps up arm.  Has taken penicillin  since w/o reaction.  . Prednisone Other (See Comments)    Stomach pain    Family History  Problem Relation Age of Onset  . Breast cancer Mother   . Alzheimer's disease Father      Prior to Admission medications   Medication Sig Start Date End Date Taking? Authorizing Provider  acetaminophen (TYLENOL) 325 MG tablet Take 2 tablets (650 mg total) by mouth every 6 (six) hours as needed for mild pain (or Fever >/= 101). 11/06/15  Yes Alison Murray, MD  albuterol (ACCUNEB) 0.63 MG/3ML nebulizer solution Take 1 ampule by nebulization every 6 (six) hours as needed for wheezing.   Yes Historical Provider, MD  albuterol (PROVENTIL HFA;VENTOLIN HFA) 108 (90 BASE) MCG/ACT inhaler Inhale 2 puffs into the lungs every 6 (six) hours as needed for wheezing or shortness of breath. 04/13/14  Yes Storm Frisk, MD  ALPRAZolam Prudy Feeler) 0.5 MG tablet Take 1 tablet (0.5 mg total) by mouth 4 (four) times daily as needed for anxiety. 11/06/15  Yes Alison Murray, MD  aspirin EC 81 MG EC tablet Take 1 tablet (81 mg total) by mouth daily. 10/28/15  Yes Wendee Beavers, DO  atorvastatin (LIPITOR) 40 MG tablet Take 1 tablet (40 mg total) by mouth daily at 6 PM. 10/27/15  Yes Wendee Beavers, DO  Calcium Carbonate Antacid (ANTACID PO) Take 1 tablet by mouth every 4 (four) hours as needed (for acid reflux or heartburn).    Yes Historical Provider, MD  dextromethorphan-guaiFENesin (MUCINEX DM) 30-600 MG 12hr tablet Take 1 tablet by mouth 2 (two) times daily. Patient taking differently: Take 1 tablet by mouth 2 (two) times daily as needed for cough.  05/10/15  Yes Ruben Im, MD  Ferrous Sulfate (IRON) 142 (45 Fe) MG TBCR Take 1 tablet by mouth 3 (three) times a week.   Yes Historical Provider, MD  furosemide (LASIX) 40 MG tablet Take 1 tablet (40 mg total) by mouth daily. 11/06/15  Yes Alison Murray, MD  ipratropium-albuterol (DUONEB) 0.5-2.5 (3) MG/3ML SOLN Take 3 mLs by nebulization every 6 (six) hours as needed. Patient  taking differently: Take 3 mLs by nebulization every 6 (six) hours as needed (for wheezing or shortness of breath).  05/12/14  Yes Albertine Grates, MD  mometasone-formoterol [facility]) 200-5 MCG/ACT AERO Inhale 2 puffs into the lungs 2 (two) times daily.   Yes Historical Provider, MD  ondansetron (ZOFRAN) 8 MG tablet Take 8 mg by mouth every 8 (eight) hours as needed for nausea or vomiting.  08/19/15  Yes Historical Provider, MD  pantoprazole (PROTONIX) 40 MG tablet Take 1 tablet (40 mg total) by mouth 2 (two) times daily. 11/01/13  Yes Vassie Loll, MD  polyethylene glycol [facility] / GLYCOLAX) packet Take 17 g by mouth daily as needed for mild constipation. 08/23/15  Yes Zannie Cove, MD  potassium chloride SA (K-DUR,KLOR-CON) 10 MEQ tablet Take 1 tablet (10 mEq total) by mouth daily. 11/06/15  Yes Alison Murray, MD  sodium chloride (OCEAN) 0.65 % SOLN nasal spray Place 1 spray into the nose as needed for congestion (Dry nasal passages).  Yes Historical Provider, MD  SPIRIVA HANDIHALER 18 MCG inhalation capsule Place 1 puff into inhaler and inhale daily as needed for wheezing. 09/07/15  Yes Historical Provider, MD  traMADol (ULTRAM) 50 MG tablet Take 1 tablet (50 mg total) by mouth daily as needed for moderate pain. 11/06/15  Yes Alison Murray, MD  vitamin E 100 UNIT capsule Take 100 Units by mouth daily as needed. supplement   Yes Historical Provider, MD    Physical Exam: Vitals:   11/07/15 0400 11/07/15 0415 11/07/15 0430 11/07/15 0518  BP: 116/68  111/61 (!) 152/79  Pulse: 104 106 98 (!) 104  Resp: 22 22 23  (!) 28  Temp:    98.6 F (37 C)  TempSrc:    Axillary  SpO2: 99% 96% 94% 97%  Height:    5' 7.5" (1.715 m)      Constitutional: In respiratory distress with tachypnea and accessory muscle recruitment; no pallor  Eyes: PERTLA, lids and conjunctivae normal ENMT: Mucous membranes are moist. Posterior pharynx clear of any exudate or lesions.   Neck: normal, supple, no masses, no  thyromegaly Respiratory: Diminished bilaterally with wheezes throughout and rhonchi at right base. Increased work of breathing.  Cardiovascular: Rate ~110 and regular. 1+ pretibial edema b/l. No significant JVD. Abdomen: No distension, no tenderness, no masses palpated. Bowel sounds normal.  Musculoskeletal: no clubbing / cyanosis. No joint deformity upper and lower extremities. Normal muscle tone.  Skin: no significant rashes, lesions, ulcers. Warm, dry, well-perfused. Neurologic: CN 2-12 grossly intact. Sensation intact, DTR normal. Strength 5/5 in all 4 limbs.  Psychiatric: Normal judgment and insight. Alert and oriented x 3. Normal mood and affect.     Labs on Admission: I have personally reviewed following labs and imaging studies  CBC:  Recent Labs Lab 11/03/15 1050 11/04/15 0456 11/07/15 0243  WBC 10.5 8.1 12.6*  NEUTROABS 8.9* 6.7 10.6*  HGB 10.7* 9.1* 10.3*  HCT 36.7* 30.7* 35.7*  MCV 87.0 85.0 88.8  PLT 352 302 304   Basic Metabolic Panel:  Recent Labs Lab 11/03/15 1050 11/03/15 1722 11/04/15 0456 11/05/15 0512 11/07/15 0243  NA 134*  --  135 136 138  K 2.3*  --  2.8* 3.1* 4.2  CL 79*  --  90* 93* 97*  CO2 42*  --  38* 34* 34*  GLUCOSE 126*  --  85 94 108*  BUN 39*  --  19 14 12   CREATININE 1.57*  --  0.90 0.68 0.74  CALCIUM 9.8  --  8.5* 8.9 9.1  MG  --  2.4  --  1.8  --   PHOS  --  1.5*  --   --   --    GFR: Estimated Creatinine Clearance: 77.1 mL/min (by C-G formula based on SCr of 0.74 mg/dL). Liver Function Tests:  Recent Labs Lab 11/03/15 1050 11/07/15 0243  AST 27 23  ALT 18 16*  ALKPHOS 84 82  BILITOT 0.5 0.7  PROT 7.0 6.0*  ALBUMIN 3.8 2.9*   No results for input(s): LIPASE, AMYLASE in the last 168 hours. No results for input(s): AMMONIA in the last 168 hours. Coagulation Profile: No results for input(s): INR, PROTIME in the last 168 hours. Cardiac Enzymes:  Recent Labs Lab 11/03/15 1050 11/03/15 1722 11/03/15 2349  11/04/15 0456  TROPONINI 0.06* 0.06* 0.04* 0.05*   BNP (last 3 results) No results for input(s): PROBNP in the last 8760 hours. HbA1C: No results for input(s): HGBA1C in the last 72 hours. CBG:  No results for input(s): GLUCAP in the last 168 hours. Lipid Profile: No results for input(s): CHOL, HDL, LDLCALC, TRIG, CHOLHDL, LDLDIRECT in the last 72 hours. Thyroid Function Tests: No results for input(s): TSH, T4TOTAL, FREET4, T3FREE, THYROIDAB in the last 72 hours. Anemia Panel: No results for input(s): VITAMINB12, FOLATE, FERRITIN, TIBC, IRON, RETICCTPCT in the last 72 hours. Urine analysis:    Component Value Date/Time   COLORURINE YELLOW 10/25/2015 1210   APPEARANCEUR CLEAR 10/25/2015 1210   LABSPEC 1.013 10/25/2015 1210   PHURINE 7.5 10/25/2015 1210   GLUCOSEU NEGATIVE 10/25/2015 1210   HGBUR NEGATIVE 10/25/2015 1210   BILIRUBINUR NEGATIVE 10/25/2015 1210   KETONESUR NEGATIVE 10/25/2015 1210   PROTEINUR NEGATIVE 10/25/2015 1210   UROBILINOGEN 0.2 10/29/2013 2011   NITRITE NEGATIVE 10/25/2015 1210   LEUKOCYTESUR NEGATIVE 10/25/2015 1210   Sepsis Labs: @LABRCNTIP (procalcitonin:4,lacticidven:4) )No results found for this or any previous visit (from the past 240 hour(s)).   Radiological Exams on Admission: Dg Chest 2 View  Result Date: 11/07/2015 CLINICAL DATA:  Shortness of breath starting at 0100 hours this morning. EXAM: CHEST  2 VIEW COMPARISON:  11/04/2015 FINDINGS: Normal heart size and pulmonary vascularity. Shallow inspiration. Small bilateral pleural effusions. Developing consolidation in the lung bases, greater on the left. Pulmonary fibrosis with peribronchial thickening suggesting chronic bronchitis. No pneumothorax. IMPRESSION: Small bilateral pleural effusions with increasing basilar atelectasis or consolidation. Changes may indicate pneumonia. Fibrosis and chronic bronchitic changes in the lungs. Electronically Signed   By: Burman NievesWilliam  Stevens M.D.   On: 11/07/2015  03:12    EKG: Independently reviewed. Sinus tachycardia (rate 109), RSR' in V1 and V2  Assessment/Plan  1. HCAP  - Presents with new productive cough, increased O2-requirement, new leukocytosis, and new infiltrates on CXR  - Lactic acid reassuring at 1.62 - Blood cultures obtained in ED, sputum culture and gram stain requested  - Empiric vancomycin and cefepime started in ED, will continue  - Check urine for strep pneumo and legionella antigens   2. COPD with acute exacerbation  - Has chronic O2 requirement; presents with acute exacerbation precipitated by the infection  - 125 mg Solu-Medrol given in ED, will continue at 60 mg q6h for now  - DuoNeb q4h, continue Dulera; antibiotics as above  - Monitor with continuous pulse oximetry and titrate FiO2 to keep sat low-mid 90's   3. Chronic diastolic CHF  - Appears a little volume-up on admission  - TTE (08/20/15) with EF 55-60%, grade 1 diastolic dysfunction, no significant valvular disease  - Lasix held at time of admission given acute infection; resume as appropriate - SLIV, follow daily weights and strict I/O's, fluid-restrict diet   4. Normocytic anemia  - Hgb 10.3 on admission and stable with no s/s of active bleeding  - Attributed to iron-deficiency, will continue supplementation    5. GERD - Stable, continue Protonix qD    DVT prophylaxis: sq Lovenox  Code Status: Full  Family Communication: Discussed with patient Disposition Plan: Admit to telemetry Consults called: None Admission status: Inpatient    Briscoe Deutscherimothy S Opyd, MD [facility] Pager [phone]  If 7PM-7AM, please contact night-coverage [email or web address] Password [email or web address]  11/07/2015, 5:47 AM

## 2015-11-07 NOTE — ED Triage Notes (Addendum)
Per EMS , [facility] with complaint of SOB which started at 0100 morning . Denied of chest pain nor cough,alert and oriented x4, pt. Pt. Stated  that he was just discharged from hospital yesterday for kidney problem. Has Hx of CHF and COPD. Received breathing tx in facility but  no relief. On non-rebreather upon arrival to ED, pt. With 100% O2 sat..Marland Kitchen

## 2015-11-08 LAB — CBC
HEMATOCRIT: 32.3 % — AB (ref 39.0–52.0)
Hemoglobin: 9.3 g/dL — ABNORMAL LOW (ref 13.0–17.0)
MCH: 25.6 pg — AB (ref 26.0–34.0)
MCHC: 28.8 g/dL — ABNORMAL LOW (ref 30.0–36.0)
MCV: 89 fL (ref 78.0–100.0)
PLATELETS: 302 10*3/uL (ref 150–400)
RBC: 3.63 MIL/uL — ABNORMAL LOW (ref 4.22–5.81)
RDW: 17.5 % — AB (ref 11.5–15.5)
WBC: 12.2 10*3/uL — AB (ref 4.0–10.5)

## 2015-11-08 LAB — BASIC METABOLIC PANEL
ANION GAP: 9 (ref 5–15)
BUN: 17 mg/dL (ref 6–20)
CALCIUM: 9.1 mg/dL (ref 8.9–10.3)
CO2: 30 mmol/L (ref 22–32)
Chloride: 100 mmol/L — ABNORMAL LOW (ref 101–111)
Creatinine, Ser: 0.77 mg/dL (ref 0.61–1.24)
GFR calc Af Amer: >60 mL/min (ref >60)
GFR calc non Af Amer: >60 mL/min (ref >60)
GLUCOSE: 146 mg/dL — AB (ref 65–99)
Potassium: 4.9 mmol/L (ref 3.5–5.1)
Sodium: 139 mmol/L (ref 135–145)

## 2015-11-08 LAB — STREP PNEUMONIAE URINARY ANTIGEN: Strep Pneumo Urinary Antigen: NEGATIVE

## 2015-11-08 MED ORDER — FUROSEMIDE 40 MG PO TABS
40.0000 mg | ORAL_TABLET | Freq: Every day | ORAL | Status: DC
  Administered 2015-11-08 – 2015-11-10 (×3): 40 mg via ORAL
  Filled 2015-11-08 (×3): qty 1

## 2015-11-08 MED ORDER — ENSURE ENLIVE PO LIQD
237.0000 mL | Freq: Two times a day (BID) | ORAL | Status: DC
  Administered 2015-11-08 – 2015-11-09 (×2): 237 mL via ORAL

## 2015-11-08 MED ORDER — METHYLPREDNISOLONE SODIUM SUCC 125 MG IJ SOLR
60.0000 mg | Freq: Two times a day (BID) | INTRAMUSCULAR | Status: AC
  Administered 2015-11-08 – 2015-11-09 (×3): 60 mg via INTRAVENOUS
  Filled 2015-11-08 (×3): qty 2

## 2015-11-08 NOTE — NC FL2 (Signed)
[place] MEDICAID FL2 LEVEL OF CARE SCREENING TOOL     IDENTIFICATION  Patient Name: [name] [name]. Birthdate: [birth year] Sex: male Admission Date (Current Location): 11/07/2015  [place] Number:  Producer, television/film/video and Address:  [facility],  [address], [place] [address]      Provider Number: [id number]  Attending Physician Name and Address:  Starleen Arms, MD  Relative Name and Phone Number:       Current Level of Care: Hospital Recommended Level of Care: Skilled Nursing Facility Prior Approval Number:    Date Approved/Denied:   PASRR Number:  [id number]  Discharge Plan: SNF    Current Diagnoses: Patient Active Problem List   Diagnosis Date Noted  . HCAP (healthcare-associated pneumonia) 11/07/2015  . Acute kidney injury (HCC) 11/03/2015  . Hyponatremia 11/03/2015  . Falls   . Shortness of breath 10/25/2015  . Abdominal pain 08/19/2015  . Elevated troponin 08/19/2015  . Chest pressure 08/19/2015  . Chronic diastolic CHF (congestive heart failure) (HCC)   . Troponin level elevated   . B12 deficiency 05/12/2015  . Iron deficiency anemia   . Abdominal distension   . COPD exacerbation (HCC) 05/06/2015  . Influenza B 05/06/2015  . Normocytic anemia 05/06/2015  . Cataract of right eye 05/06/2015  . COPD (chronic obstructive pulmonary disease) (HCC) 05/09/2014  . Acute on chronic respiratory failure with hypoxia (HCC) 05/09/2014  . Influenza A H1N1 infection 05/09/2014  . AKI (acute kidney injury) (HCC) 05/09/2014  . Dehydration 05/09/2014  . Lobar pneumonia due to unspecified organism 05/05/2014  . Tobacco abuse 01/19/2014  . History of DVT (deep vein thrombosis)   . On home oxygen therapy   . Hypokalemia 10/29/2013  . GERD 12/25/2008  . Essential hypertension 11/09/2008  . 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 11/09/2008    Orientation RESPIRATION BLADDER Height & Weight      Self, Time, Situation, Place  O2 (4L) Continent Weight: 170 lb 14.4 oz (77.5 kg) Height:  5' 7.5" (171.5 cm)  BEHAVIORAL SYMPTOMS/MOOD NEUROLOGICAL BOWEL NUTRITION STATUS      Continent Diet  AMBULATORY STATUS COMMUNICATION OF NEEDS Skin   Extensive Assist Verbally Normal                       Personal Care Assistance Level of Assistance  Bathing, Feeding, Dressing Bathing Assistance: Limited assistance Feeding assistance: Limited assistance Dressing Assistance: Limited assistance     Functional Limitations Info  Sight, Hearing, Speech Sight Info: Adequate Hearing Info: Adequate Speech Info: Adequate    SPECIAL CARE FACTORS FREQUENCY  PT (By licensed PT), OT (By licensed OT)     PT Frequency: 5 OT Frequency: 5            Contractures Contractures Info: Not present    Additional Factors Info  Code Status, Allergies Code Status Info: Ful Code Allergies Info: Adhesive Tape, Advair Diskus Fluticasone-salmeterol, Brovana Arformoterol, Codeine, Ferrous Sulfate, Other, Prednisone           Current Medications (11/08/2015):  This is the current hospital active medication list Current Facility-Administered Medications  Medication Dose Route Frequency Provider Last Rate Last Dose  . 0.9 %  sodium chloride infusion  250 mL Intravenous PRN Briscoe Deutscher, MD      . acetaminophen (TYLENOL) tablet 650 mg  650 mg Oral Q6H PRN Briscoe Deutscher, MD   650 mg at 11/07/15 1851  . ALPRAZolam Prudy Feeler)  tablet 0.5 mg  0.5 mg Oral QID PRN Briscoe Deutscherimothy S Opyd, MD   0.5 mg at 11/07/15 1851  . aspirin EC tablet 81 mg  81 mg Oral Daily Briscoe Deutscherimothy S Opyd, MD   81 mg at 11/07/15 1058  . atorvastatin (LIPITOR) tablet 40 mg  40 mg Oral q1800 Briscoe Deutscherimothy S Opyd, MD   40 mg at 11/07/15 1851  . ceFEPIme (MAXIPIME) 1 g in dextrose 5 % 50 mL IVPB  1 g Intravenous Q8H Briscoe Deutscherimothy S Opyd, MD   1 g at 11/08/15 0504  . dextromethorphan-guaiFENesin (MUCINEX DM) 30-600 MG per 12 hr tablet 1 tablet  1 tablet Oral BID PRN  Lavone Neriimothy S Opyd, MD      . enoxaparin (LOVENOX) injection 40 mg  40 mg Subcutaneous Q24H Briscoe Deutscherimothy S Opyd, MD   40 mg at 11/07/15 1059  . ferrous sulfate tablet 325 mg  325 mg Oral Once per day on Mon Wed Fri Timothy S Opyd, MD      . Influenza vac split quadrivalent PF (FLUARIX) injection 0.5 mL  0.5 mL Intramuscular Tomorrow-1000 Leana Roeawood S Elgergawy, MD      . ipratropium-albuterol (DUONEB) 0.5-2.5 (3) MG/3ML nebulizer solution 3 mL  3 mL Nebulization Q4H PRN Briscoe Deutscherimothy S Opyd, MD   3 mL at 11/07/15 2005  . ipratropium-albuterol (DUONEB) 0.5-2.5 (3) MG/3ML nebulizer solution 3 mL  3 mL Nebulization TID Starleen Armsawood S Elgergawy, MD   3 mL at 11/08/15 0811  . methylPREDNISolone sodium succinate (SOLU-MEDROL) 125 mg/2 mL injection 60 mg  60 mg Intravenous Q8H Briscoe Deutscherimothy S Opyd, MD   60 mg at 11/08/15 0504  . mometasone-formoterol (DULERA) 200-5 MCG/ACT inhaler 2 puff  2 puff Inhalation BID Briscoe Deutscherimothy S Opyd, MD   2 puff at 11/08/15 [phone]  . pantoprazole (PROTONIX) EC tablet 40 mg  40 mg Oral BID Briscoe Deutscherimothy S Opyd, MD   40 mg at 11/07/15 2210  . polyethylene glycol (MIRALAX / GLYCOLAX) packet 17 g  17 g Oral Daily PRN Lavone Neriimothy S Opyd, MD      . sodium chloride (OCEAN) 0.65 % nasal spray 1 spray  1 spray Nasal PRN Lavone Neriimothy S Opyd, MD      . sodium chloride flush (NS) 0.9 % injection 3 mL  3 mL Intravenous Q12H Briscoe Deutscherimothy S Opyd, MD   3 mL at 11/07/15 2211  . sodium chloride flush (NS) 0.9 % injection 3 mL  3 mL Intravenous PRN Briscoe Deutscherimothy S Opyd, MD      . traMADol (ULTRAM) tablet 50 mg  50 mg Oral Daily PRN Briscoe Deutscherimothy S Opyd, MD   50 mg at 11/07/15 0824  . vancomycin (VANCOCIN) IVPB 1000 mg/200 mL premix  1,000 mg Intravenous Q12H Briscoe Deutscherimothy S Opyd, MD   1,000 mg at 11/08/15 0550     Discharge Medications: Please see discharge summary for a list of discharge medications.  Relevant Imaging Results:  Relevant Lab Results:   Additional Information SSN:  [id number]  Raye SorrowCoble, Sarae Nicholes N, [place]

## 2015-11-08 NOTE — Care Management Important Message (Signed)
Important Message  Patient Details  Name: [name] Morningddie Corriveau Jr. MRN: [id number] Date of Birth: 1940-09-09   Medicare Important Message Given:  Yes    Geni BersMcGibboney, Jareli Highland, RN 11/08/2015, 4:02 PM

## 2015-11-08 NOTE — Progress Notes (Signed)
PROGRESS NOTE                                                                                                                                                                                                             Patient Demographics:    [name] [name], is a 75 y.o. male, DOB - 01/25/1941, WGN:[id number]  Admit date - 11/07/2015   Admitting Physician Briscoe Deutscherimothy S Opyd, MD  Outpatient Primary MD for the patient is Millsaps, [name], NP  LOS - 1   Chief Complaint  Patient presents with  . Shortness of Breath       Brief Narrative   75 year old male admitted earlier during the day by my colleague, charts, labs and imaging were reviewed, agent was discharged yesterday to SNF, presents with shortness of breath, cough, workup significant for HCAP.   Subjective:    [name] HazyEddie [name] today has, No headache, No chest pain, No abdominal pain - No Nausea, Reports cough, nonproductive. Dyspnea at baseline.  Assessment  & Plan :    Principal Problem:   HCAP (healthcare-associated pneumonia) Active Problems:   Essential hypertension   GERD   Acute on chronic respiratory failure with hypoxia (HCC)   COPD exacerbation (HCC)   Normocytic anemia   Chronic diastolic CHF (congestive heart failure) (HCC)   HCAP  - Presents with new productive cough, increased O2-requirement, new leukocytosis, and new infiltrates on CXR  - Lactic acid reassuring at 1.62 - Blood culturesWith no growth to date sputum culture and gram stain pending - Empiric vancomycin and cefepime started in ED, will discontinue vancomycin 10/2, continue with IV cefepime -  strep pneumo negative , legionella antigens  pending  Chronic respiratory failure - patient  reports he is on 3.5 L nasal cannula at baseline   COPD with acute exacerbation  - Has chronic O2 requirement; presents with acute exacerbation precipitated by the infection  - 125 mg Solu-Medrol given in ED, will continue  with further steroid taper - DuoNeb q4h, continue Dulera; antibiotics as above  - Monitor with continuous pulse oximetry and titrate FiO2 to keep sat low-mid 90's   Chronic diastolic CHF  - Appears a little volume-up on admission  - TTE (08/20/15) with EF 55-60%, grade 1 diastolic dysfunction, no significant valvular disease  - Lasix held at time of admission given acute infection; will  resume today - SLIV, follow daily weights and strict I/O's, fluid-restrict diet   Normocytic anemia  - Hgb 10.3 on admission and stable with no s/s of active bleeding  - Attributed to iron-deficiency, will continue supplementation    GERD - Stable, continue Protonix qD      Code Status : Full  Family Communication  : none at bedside  Disposition Plan  : back to SNF in 1-2 days.  Consults  :  none  Procedures  : None  DVT Prophylaxis  :  Lovenox  Lab Results  Component Value Date   PLT 302 11/08/2015    Antibiotics  :    Anti-infectives    Start     Dose/Rate Route Frequency Ordered Stop   11/07/15 1800  vancomycin (VANCOCIN) IVPB 1000 mg/200 mL premix  Status:  Discontinued     1,000 mg 200 mL/hr over 60 Minutes Intravenous Every 12 hours 11/07/15 0545 11/08/15 1203   11/07/15 1400  ceFEPIme (MAXIPIME) 1 g in dextrose 5 % 50 mL IVPB     1 g 100 mL/hr over 30 Minutes Intravenous Every 8 hours 11/07/15 0545     11/07/15 0345  vancomycin (VANCOCIN) IVPB 1000 mg/200 mL premix     1,000 mg 200 mL/hr over 60 Minutes Intravenous  Once 11/07/15 0338 11/07/15 0517   11/07/15 0345  ceFEPIme (MAXIPIME) 2 g in dextrose 5 % 50 mL IVPB     2 g 100 mL/hr over 30 Minutes Intravenous  Once 11/07/15 0338 11/07/15 0432        Objective:   Vitals:   11/07/15 2153 11/08/15 0440 11/08/15 0500 11/08/15 0813  BP: 121/75 123/76    Pulse: 91 93    Resp: (!) 22 (!) 22    Temp: 98.6 F (37 C) 98.2 F (36.8 C)    TempSrc: Oral Oral    SpO2: 94% 97%  94%  Weight:   77.5 kg (170 lb 14.4 oz)     Height:        Wt Readings from Last 3 Encounters:  11/08/15 77.5 kg (170 lb 14.4 oz)  11/06/15 74.9 kg (165 lb 2 oz)  10/27/15 77 kg (169 lb 12.1 oz)     Intake/Output Summary (Last 24 hours) at 11/08/15 1204 Last data filed at 11/08/15 1100  Gross per 24 hour  Intake              910 ml  Output                0 ml  Net              910 ml     Physical Exam  Awake Alert, Oriented X 3, Frail Supple Neck,No JVD,   Symmetrical Chest wall movement, Good air movement bilaterally, Mild scattered wheezing RRR,No Gallops,Rubs or new Murmurs, No Parasternal Heave +ve B.Sounds, Abd Soft, No tenderness,  No Cyanosis, Clubbing or edema, No new Rash or bruise      Data Review:    CBC  Recent Labs Lab 11/03/15 1050 11/04/15 0456 11/07/15 0243 11/08/15 0434  WBC 10.5 8.1 12.6* 12.2*  HGB 10.7* 9.1* 10.3* 9.3*  HCT 36.7* 30.7* 35.7* 32.3*  PLT 352 302 304 302  MCV 87.0 85.0 88.8 89.0  MCH 25.4* 25.2* 25.6* 25.6*  MCHC 29.2* 29.6* 28.9* 28.8*  RDW 17.1* 17.1* 17.5* 17.5*  LYMPHSABS 0.7 0.7 1.0  --   MONOABS 0.9 0.5 0.9  --   EOSABS 0.0 0.1  0.1  --   BASOSABS 0.0 0.0 0.0  --     Chemistries   Recent Labs Lab 11/03/15 1050 11/03/15 1722 11/04/15 0456 11/05/15 0512 11/07/15 0243 11/08/15 0434  NA 134*  --  135 136 138 139  K 2.3*  --  2.8* 3.1* 4.2 4.9  CL 79*  --  90* 93* 97* 100*  CO2 42*  --  38* 34* 34* 30  GLUCOSE 126*  --  85 94 108* 146*  BUN 39*  --  19 14 12 17   CREATININE 1.57*  --  0.90 0.68 0.74 0.77  CALCIUM 9.8  --  8.5* 8.9 9.1 9.1  MG  --  2.4  --  1.8  --   --   AST 27  --   --   --  23  --   ALT 18  --   --   --  16*  --   ALKPHOS 84  --   --   --  82  --   BILITOT 0.5  --   --   --  0.7  --    ------------------------------------------------------------------------------------------------------------------ No results for input(s): CHOL, HDL, LDLCALC, TRIG, CHOLHDL, LDLDIRECT in the last 72 hours.  Lab Results  Component Value Date    HGBA1C 5.9 (H) 08/20/2015   ------------------------------------------------------------------------------------------------------------------ No results for input(s): TSH, T4TOTAL, T3FREE, THYROIDAB in the last 72 hours.  Invalid input(s): FREET3 ------------------------------------------------------------------------------------------------------------------ No results for input(s): VITAMINB12, FOLATE, FERRITIN, TIBC, IRON, RETICCTPCT in the last 72 hours.  Coagulation profile  Recent Labs Lab 11/07/15 0608  INR 0.96    No results for input(s): DDIMER in the last 72 hours.  Cardiac Enzymes  Recent Labs Lab 11/03/15 1722 11/03/15 2349 11/04/15 0456  TROPONINI 0.06* 0.04* 0.05*   ------------------------------------------------------------------------------------------------------------------    Component Value Date/Time   BNP 168.8 (H) 11/07/2015 0243    Inpatient Medications  Scheduled Meds: . aspirin EC  81 mg Oral Daily  . atorvastatin  40 mg Oral q1800  . ceFEPime (MAXIPIME) IV  1 g Intravenous Q8H  . enoxaparin (LOVENOX) injection  40 mg Subcutaneous Q24H  . feeding supplement (ENSURE ENLIVE)  237 mL Oral BID BM  . ferrous sulfate  325 mg Oral Once per day on Mon Wed Fri  . Influenza vac split quadrivalent PF  0.5 mL Intramuscular Tomorrow-1000  . ipratropium-albuterol  3 mL Nebulization TID  . methylPREDNISolone (SOLU-MEDROL) injection  60 mg Intravenous Q12H  . mometasone-formoterol  2 puff Inhalation BID  . pantoprazole  40 mg Oral BID  . sodium chloride flush  3 mL Intravenous Q12H   Continuous Infusions:  PRN Meds:.sodium chloride, acetaminophen, ALPRAZolam, dextromethorphan-guaiFENesin, ipratropium-albuterol, polyethylene glycol, sodium chloride, sodium chloride flush, traMADol  Micro Results Recent Results (from the past 240 hour(s))  MRSA PCR Screening     Status: None   Collection Time: 11/07/15  5:28 AM  Result Value Ref Range Status   MRSA by  PCR NEGATIVE NEGATIVE Final    Comment:        The GeneXpert MRSA Assay (FDA approved for NASAL specimens only), is one component of a comprehensive MRSA colonization surveillance program. It is not intended to diagnose MRSA infection nor to guide or monitor treatment for MRSA infections.     Radiology Reports Dg Chest 2 View  Result Date: 11/07/2015 CLINICAL DATA:  Shortness of breath starting at 0100 hours this morning. EXAM: CHEST  2 VIEW COMPARISON:  11/04/2015 FINDINGS: Normal heart size and pulmonary vascularity. Shallow  inspiration. Small bilateral pleural effusions. Developing consolidation in the lung bases, greater on the left. Pulmonary fibrosis with peribronchial thickening suggesting chronic bronchitis. No pneumothorax. IMPRESSION: Small bilateral pleural effusions with increasing basilar atelectasis or consolidation. Changes may indicate pneumonia. Fibrosis and chronic bronchitic changes in the lungs. Electronically Signed   By: Burman Nieves M.D.   On: 11/07/2015 03:12   Dg Chest 2 View  Result Date: 11/03/2015 CLINICAL DATA:  Shortness of breath.  Multiple recent falls EXAM: CHEST  2 VIEW COMPARISON:  October 25, 2015 and May 06, 2015 Findings coal Scarring is noted bilaterally, most notably in the right mid lung and left base regions. There is somewhat diffuse interstitial thickening which is stable. There is no frank edema or consolidation. Heart size and pulmonary vascularity are normal. There is atherosclerotic calcification in the aorta. No adenopathy. Anterior wedging of several vertebral bodies stable. There is mild new anterior wedging of the T6 vertebral body. IMPRESSION: Mild anterior wedging of the T6 vertebral body, new from most recent study. Anterior wedging of other thoracic vertebral bodies is stable. Anterior wedging is most marked at T12. There is chronic scarring and interstitial thickening, stable. No edema or consolidation. Stable cardiac silhouette.  Electronically Signed   By: Bretta Bang III M.D.   On: 11/03/2015 11:50   Dg Chest 2 View  Result Date: 10/25/2015 CLINICAL DATA:  Shortness of Breath EXAM: CHEST  2 VIEW COMPARISON:  05/06/2015 FINDINGS: Cardiac shadow is within normal limits. Diffuse interstitial changes are again identified but stable from the prior exam consistent with underlying scarring. No focal infiltrate or sizable effusion is seen. Compression deformities are again noted in the mid and lower thoracic spine. Some of these have progressed in the interval from the prior exam. Correlation with point tenderness is recommended. IMPRESSION: Chronic interstitial changes. Compression deformities of the thoracic spine which have progressed in the interval from the prior exam. Correlation to point tenderness is recommended. Electronically Signed   By: Alcide Clever M.D.   On: 10/25/2015 15:46   Dg Lumbar Spine 2-3 Views  Result Date: 10/26/2015 CLINICAL DATA:  Fall. EXAM: LUMBAR SPINE - 2-3 VIEW COMPARISON:  CT 08/19/2015. FINDINGS: T12 compression fracture is noted. Compression fractures prominent. This appears new from prior CT of 08/19/2015. Diffuse osteopenia and degenerative change. Moderate gastric distention noted. IMPRESSION: 1. T12 prominent compression fracture noted. This is new from CT of 08/19/2015. T9 compression fracture cannot be excluded. 2. Diffuse osteopenia degenerative change. 3. Moderate gastric distention. Electronically Signed   By: Maisie Fus  Register   On: 10/26/2015 12:15   Dg Pelvis 1-2 Views  Result Date: 10/25/2015 CLINICAL DATA:  Multiple falls, bilateral pelvic tenderness EXAM: PELVIS - 1-2 VIEW COMPARISON:  CT abdomen pelvis dated 08/19/2015 FINDINGS: No fracture or dislocation is seen. Bilateral hip joint spaces are preserved. Visualized bony pelvis appears intact. Mild degenerative changes of the lower lumbar spine. IMPRESSION: No fracture or dislocation is seen. Electronically Signed   By: Charline Bills M.D.   On: 10/25/2015 13:41   Portable Chest 1 View  Result Date: 11/04/2015 CLINICAL DATA:  Weakness and shortness of breath EXAM: PORTABLE CHEST 1 VIEW COMPARISON:  PA and lateral chest x-ray of November 03, 2015 FINDINGS: The lungs are adequately inflated. The interstitial markings are increased bilaterally. There is no alveolar infiltrate or pleural effusion. The cardiac silhouette is mildly enlarged though stable. The pulmonary vascularity is prominent centrally. There are bibasilar densities consistent with atelectasis or early infiltrate. IMPRESSION: COPD  with pulmonary fibrotic changes. Superimposed bibasilar atelectasis or pneumonia. Mild pulmonary vascular congestion suggests low-grade CHF. Electronically Signed   By: David  [place] M.D.   On: 11/04/2015 07:21       Ewing Fandino M.D on 11/08/2015 at 12:04 PM  Between 7am to 7pm - Pager - [phone]  After 7pm go to [email or web address] - password [facility]  [facility] -  Office  [phone]

## 2015-11-08 NOTE — Progress Notes (Signed)
LCSW completed full assessment with admission on 11/03/15 and with patient discharging on 9/30 to new SNF: [facility].  Patient and daughter in law agreeable to plan to return at DC as patient was not there even 24 hour due readmission.  LCSW updated FL2 and will send updated clinicals to [facility] for follow up.  Will continue to follow and assist with DC planning and disposition.  Deretha EmoryHannah Amanuel Sinkfield LCSW, MSW Clinical Social Work: Optician, dispensingystem [employer] Coverage for :  [employer]  [phone]

## 2015-11-08 NOTE — Progress Notes (Signed)
Initial Nutrition Assessment  DOCUMENTATION CODES:   Not applicable  INTERVENTION:  Ensure Enlive po BID, each supplement provides 350 kcal and 20 grams of protein.  Recommend snacks BID between meals. Will order for patient.   NUTRITION DIAGNOSIS:   Increased nutrient needs (protein) related to acute illness (HCAP) as evidenced by estimated needs.  GOAL:   Patient will meet greater than or equal to 90% of their needs  MONITOR:   PO intake, Supplement acceptance, Weight trends, Labs, I & O's  REASON FOR ASSESSMENT:   Consult Assessment of nutrition requirement/status  ASSESSMENT:   75 y.o. male with medical history significant for chronic diastolic CHF, and anxiety who presents to the emergency department from his SNF with acute respiratory distress, workup significant for HCAP. Patient was just discharged from the hospital yesterday after a 3 day admission for management of fatigue, loss of appetite, and AKI.   Patient reports his appetite and intake are normal and he has not noticed any changes. He does report that he doesn't eat a lot of food, sometimes finishing only 50% of a meal is normal for him. He reports usually eating 2-3 meals per day, with occasional snacks of peanut butter crackers, chips, or dry cereal. He also drinks 1 Ensure each night. He is unsure of UBW, reports his last known weight was 162 lbs, but does endorse weight changes with fluid in setting of CHF. Per chart pt seems to fluctuate between 165-170 most recently.   Meal Completion: 100% per chart. In past 24 hrs, patient has had 1101 kcal (65% minimum estimated kcal needs) and 47 grams protein (59% minimum estimated protein needs).   Medications reviewed and include: ferrous sulfate 325 mg, pantoprazole, methylprednisolone 60 mg Q8hrs.   Labs reviewed: Chloride 100, Glucose 146.   Nutrition-Focused physical exam completed. Findings are no fat depletion, no muscle depletion, and mild edema.    Discussed plan with RN.   Diet Order:  Diet Heart Room service appropriate? Yes; Fluid consistency: Thin; Fluid restriction: 1500 mL Fluid  Skin:  Wound (see comment) (Skin tear right arm)  Last BM:  11/06/2015  Height:   Ht Readings from Last 1 Encounters:  11/07/15 5' 7.5" (1.715 m)    Weight:   Wt Readings from Last 1 Encounters:  11/08/15 170 lb 14.4 oz (77.5 kg)    Ideal Body Weight:  68.64 kg  BMI:  Body mass index is 26.37 kg/m.  Estimated Nutritional Needs:   Kcal:  1700-1900  Protein:  80-95 grams  Fluid:  1.5 L fluid restriction per MD  EDUCATION NEEDS:   No education needs identified at this time  Helane RimaLeanne Roderick Sweezy, MS, RD, LDN Pager: [phone] After Hours Pager: [phone]

## 2015-11-09 MED ORDER — MOMETASONE FURO-FORMOTEROL FUM 200-5 MCG/ACT IN AERO
2.0000 | INHALATION_SPRAY | Freq: Two times a day (BID) | RESPIRATORY_TRACT | Status: DC
  Administered 2015-11-09 – 2015-11-10 (×2): 2 via RESPIRATORY_TRACT
  Filled 2015-11-09: qty 8.8

## 2015-11-09 MED ORDER — ALUM & MAG HYDROXIDE-SIMETH 200-200-20 MG/5ML PO SUSP
30.0000 mL | Freq: Three times a day (TID) | ORAL | Status: DC | PRN
  Administered 2015-11-09: 30 mL via ORAL
  Filled 2015-11-09: qty 30

## 2015-11-09 MED ORDER — PREDNISONE 20 MG PO TABS
40.0000 mg | ORAL_TABLET | Freq: Every day | ORAL | Status: DC
  Administered 2015-11-10: 40 mg via ORAL
  Filled 2015-11-09: qty 2

## 2015-11-09 MED ORDER — ONDANSETRON HCL 4 MG/2ML IJ SOLN
4.0000 mg | Freq: Four times a day (QID) | INTRAMUSCULAR | Status: DC | PRN
  Administered 2015-11-09: 4 mg via INTRAVENOUS
  Filled 2015-11-09: qty 2

## 2015-11-09 NOTE — Progress Notes (Signed)
PROGRESS NOTE                                                                                                                                                                                                             Patient Demographics:    [name] [name], is a 75 y.o. male, DOB - 11-05-1940, ZOX:[id number]  Admit date - 11/07/2015   Admitting Physician [name] Deutscher, MD  Outpatient Primary MD for the patient is Millsaps, Joelene Millin, NP  LOS - 2   Chief Complaint  Patient presents with  . Shortness of Breath       Brief Narrative   75 year old male admitted earlier during the day by my colleague, charts, labs and imaging were reviewed, agent was discharged yesterday to SNF, presents with shortness of breath, cough, workup significant for HCAP And COPD exacerbation.   Subjective:    [name] [name] today has, No headache, No chest pain, No abdominal pain - No Nausea, Reports cough, nonproductive. Dyspnea at baseline.  Assessment  & Plan :    Principal Problem:   HCAP (healthcare-associated pneumonia) Active Problems:   Essential hypertension   GERD   Acute on chronic respiratory failure with hypoxia (HCC)   COPD exacerbation (HCC)   Normocytic anemia   Chronic diastolic CHF (congestive heart failure) (HCC)   HCAP  - Presents with new productive cough, increased O2-requirement, new leukocytosis, and new infiltrates on CXR  - Lactic acid reassuring at 1.62 - Blood culturesWith no growth to date sputum culture and gram stain pending - Empiric vancomycin and cefepime started in ED,  discontinued vancomycin 10/2, continue with IV cefepime, can be changed to by mouth antibiotics tomorrow. -  strep pneumo negative , legionella antigens  pending  Chronic respiratory failure - patient  reports he is on 3.5 L nasal cannula at baseline   COPD with acute exacerbation  - Has chronic O2 requirement; presents with acute exacerbation precipitated  by the infection  - 125 mg Solu-Medrol given in ED, will continue with further steroid taper, will transition to by mouth prednisone tomorrow - DuoNeb q4h, continue Dulera; antibiotics as above  - Monitor with continuous pulse oximetry and titrate FiO2 to keep sat low-mid 90's   Chronic diastolic CHF  - Appears a little volume-up on admission  - TTE (08/20/15) with EF 55-60%, grade 1  diastolic dysfunction, no significant valvular disease  - Lasix held at time of admission given acute infection, resumed back on home dose Lasix - SLIV, follow daily weights and strict I/O's, fluid-restrict diet   Normocytic anemia  - Hgb 10.3 on admission and stable with no s/s of active bleeding  - Attributed to iron-deficiency, will continue supplementation    GERD - Stable, continue Protonix qD      Code Status : Full  Family Communication  : none at bedside  Disposition Plan  : back to SNF  in a.m.  Consults  :  none  Procedures  : None  DVT Prophylaxis  :  Lovenox  Lab Results  Component Value Date   PLT 302 11/08/2015    Antibiotics  :    Anti-infectives    Start     Dose/Rate Route Frequency Ordered Stop   11/07/15 1800  vancomycin (VANCOCIN) IVPB 1000 mg/200 mL premix  Status:  Discontinued     1,000 mg 200 mL/hr over 60 Minutes Intravenous Every 12 hours 11/07/15 0545 11/08/15 1203   11/07/15 1400  ceFEPIme (MAXIPIME) 1 g in dextrose 5 % 50 mL IVPB     1 g 100 mL/hr over 30 Minutes Intravenous Every 8 hours 11/07/15 0545     11/07/15 0345  vancomycin (VANCOCIN) IVPB 1000 mg/200 mL premix     1,000 mg 200 mL/hr over 60 Minutes Intravenous  Once 11/07/15 0338 11/07/15 0517   11/07/15 0345  ceFEPIme (MAXIPIME) 2 g in dextrose 5 % 50 mL IVPB     2 g 100 mL/hr over 30 Minutes Intravenous  Once 11/07/15 0338 11/07/15 0432        Objective:   Vitals:   11/08/15 2150 11/08/15 2337 11/09/15 0543 11/09/15 0806  BP: 121/81  133/83   Pulse: 90  92   Resp: 18  18   Temp: 99  F (37.2 C)  99 F (37.2 C)   TempSrc: Oral  Oral   SpO2: 97% 95% 96% 95%  Weight:   77.2 kg (170 lb 1.6 oz)   Height:        Wt Readings from Last 3 Encounters:  11/09/15 77.2 kg (170 lb 1.6 oz)  11/06/15 74.9 kg (165 lb 2 oz)  10/27/15 77 kg (169 lb 12.1 oz)     Intake/Output Summary (Last 24 hours) at 11/09/15 1211 Last data filed at 11/09/15 1109  Gross per 24 hour  Intake              630 ml  Output             2000 ml  Net            -1370 ml     Physical Exam  Awake Alert, Oriented X 3, Frail Supple Neck,No JVD,   Symmetrical Chest wall movement, Good air movement bilaterally, Mild scattered wheezing RRR,No Gallops,Rubs or new Murmurs, No Parasternal Heave +ve B.Sounds, Abd Soft, No tenderness,  No Cyanosis, Clubbing or edema, No new Rash or bruise      Data Review:    CBC  Recent Labs Lab 11/03/15 1050 11/04/15 0456 11/07/15 0243 11/08/15 0434  WBC 10.5 8.1 12.6* 12.2*  HGB 10.7* 9.1* 10.3* 9.3*  HCT 36.7* 30.7* 35.7* 32.3*  PLT 352 302 304 302  MCV 87.0 85.0 88.8 89.0  MCH 25.4* 25.2* 25.6* 25.6*  MCHC 29.2* 29.6* 28.9* 28.8*  RDW 17.1* 17.1* 17.5* 17.5*  LYMPHSABS 0.7 0.7 1.0  --  MONOABS 0.9 0.5 0.9  --   EOSABS 0.0 0.1 0.1  --   BASOSABS 0.0 0.0 0.0  --     Chemistries   Recent Labs Lab 11/03/15 1050 11/03/15 1722 11/04/15 0456 11/05/15 0512 11/07/15 0243 11/08/15 0434  NA 134*  --  135 136 138 139  K 2.3*  --  2.8* 3.1* 4.2 4.9  CL 79*  --  90* 93* 97* 100*  CO2 42*  --  38* 34* 34* 30  GLUCOSE 126*  --  85 94 108* 146*  BUN 39*  --  19 14 12 17   CREATININE 1.57*  --  0.90 0.68 0.74 0.77  CALCIUM 9.8  --  8.5* 8.9 9.1 9.1  MG  --  2.4  --  1.8  --   --   AST 27  --   --   --  23  --   ALT 18  --   --   --  16*  --   ALKPHOS 84  --   --   --  82  --   BILITOT 0.5  --   --   --  0.7  --    ------------------------------------------------------------------------------------------------------------------ No results for  input(s): CHOL, HDL, LDLCALC, TRIG, CHOLHDL, LDLDIRECT in the last 72 hours.  Lab Results  Component Value Date   HGBA1C 5.9 (H) 08/20/2015   ------------------------------------------------------------------------------------------------------------------ No results for input(s): TSH, T4TOTAL, T3FREE, THYROIDAB in the last 72 hours.  Invalid input(s): FREET3 ------------------------------------------------------------------------------------------------------------------ No results for input(s): VITAMINB12, FOLATE, FERRITIN, TIBC, IRON, RETICCTPCT in the last 72 hours.  Coagulation profile  Recent Labs Lab 11/07/15 0608  INR 0.96    No results for input(s): DDIMER in the last 72 hours.  Cardiac Enzymes  Recent Labs Lab 11/03/15 1722 11/03/15 2349 11/04/15 0456  TROPONINI 0.06* 0.04* 0.05*   ------------------------------------------------------------------------------------------------------------------    Component Value Date/Time   BNP 168.8 (H) 11/07/2015 0243    Inpatient Medications  Scheduled Meds: . aspirin EC  81 mg Oral Daily  . atorvastatin  40 mg Oral q1800  . ceFEPime (MAXIPIME) IV  1 g Intravenous Q8H  . enoxaparin (LOVENOX) injection  40 mg Subcutaneous Q24H  . feeding supplement (ENSURE ENLIVE)  237 mL Oral BID BM  . ferrous sulfate  325 mg Oral Once per day on Mon Wed Fri  . furosemide  40 mg Oral Daily  . ipratropium-albuterol  3 mL Nebulization TID  . methylPREDNISolone (SOLU-MEDROL) injection  60 mg Intravenous Q12H  . mometasone-formoterol  2 puff Inhalation BID  . pantoprazole  40 mg Oral BID  . [START ON 11/10/2015] predniSONE  40 mg Oral Q breakfast  . sodium chloride flush  3 mL Intravenous Q12H   Continuous Infusions:  PRN Meds:.sodium chloride, acetaminophen, ALPRAZolam, dextromethorphan-guaiFENesin, ipratropium-albuterol, polyethylene glycol, sodium chloride, sodium chloride flush, traMADol  Micro Results Recent Results (from the  past 240 hour(s))  Culture, blood (Routine X 2) w Reflex to ID Panel     Status: None (Preliminary result)   Collection Time: 11/07/15  3:31 AM  Result Value Ref Range Status   Specimen Description BLOOD LEFT HAND  Final   Special Requests BOTTLES DRAWN AEROBIC AND ANAEROBIC 5 CC  Final   Culture   Final    NO GROWTH 1 DAY [facility]     Report Status PENDING  Incomplete  Culture, blood (Routine X 2) w Reflex to ID Panel     Status: None (Preliminary result)   Collection  Time: 11/07/15  3:36 AM  Result Value Ref Range Status   Specimen Description BLOOD RIGHT HAND  Final   Special Requests BOTTLES DRAWN AEROBIC AND ANAEROBIC 5 CC  Final   Culture   Final    NO GROWTH 1 DAY Performed at [facility]    Report Status PENDING  Incomplete  MRSA PCR Screening     Status: None   Collection Time: 11/07/15  5:28 AM  Result Value Ref Range Status   MRSA by PCR NEGATIVE NEGATIVE Final    Comment:        The GeneXpert MRSA Assay (FDA approved for NASAL specimens only), is one component of a comprehensive MRSA colonization surveillance program. It is not intended to diagnose MRSA infection nor to guide or monitor treatment for MRSA infections.     Radiology Reports Dg Chest 2 View  Result Date: 11/07/2015 CLINICAL DATA:  Shortness of breath starting at 0100 hours this morning. EXAM: CHEST  2 VIEW COMPARISON:  11/04/2015 FINDINGS: Normal heart size and pulmonary vascularity. Shallow inspiration. Small bilateral pleural effusions. Developing consolidation in the lung bases, greater on the left. Pulmonary fibrosis with peribronchial thickening suggesting chronic bronchitis. No pneumothorax. IMPRESSION: Small bilateral pleural effusions with increasing basilar atelectasis or consolidation. Changes may indicate pneumonia. Fibrosis and chronic bronchitic changes in the lungs. Electronically Signed   By: Burman Nieves M.D.   On: 11/07/2015 03:12   Dg Chest 2  View  Result Date: 11/03/2015 CLINICAL DATA:  Shortness of breath.  Multiple recent falls EXAM: CHEST  2 VIEW COMPARISON:  October 25, 2015 and May 06, 2015 Findings coal Scarring is noted bilaterally, most notably in the right mid lung and left base regions. There is somewhat diffuse interstitial thickening which is stable. There is no frank edema or consolidation. Heart size and pulmonary vascularity are normal. There is atherosclerotic calcification in the aorta. No adenopathy. Anterior wedging of several vertebral bodies stable. There is mild new anterior wedging of the T6 vertebral body. IMPRESSION: Mild anterior wedging of the T6 vertebral body, new from most recent study. Anterior wedging of other thoracic vertebral bodies is stable. Anterior wedging is most marked at T12. There is chronic scarring and interstitial thickening, stable. No edema or consolidation. Stable cardiac silhouette. Electronically Signed   By: Bretta Bang III M.D.   On: 11/03/2015 11:50   Dg Chest 2 View  Result Date: 10/25/2015 CLINICAL DATA:  Shortness of Breath EXAM: CHEST  2 VIEW COMPARISON:  05/06/2015 FINDINGS: Cardiac shadow is within normal limits. Diffuse interstitial changes are again identified but stable from the prior exam consistent with underlying scarring. No focal infiltrate or sizable effusion is seen. Compression deformities are again noted in the mid and lower thoracic spine. Some of these have progressed in the interval from the prior exam. Correlation with point tenderness is recommended. IMPRESSION: Chronic interstitial changes. Compression deformities of the thoracic spine which have progressed in the interval from the prior exam. Correlation to point tenderness is recommended. Electronically Signed   By: Alcide Clever M.D.   On: 10/25/2015 15:46   Dg Lumbar Spine 2-3 Views  Result Date: 10/26/2015 CLINICAL DATA:  Fall. EXAM: LUMBAR SPINE - 2-3 VIEW COMPARISON:  CT 08/19/2015. FINDINGS: T12  compression fracture is noted. Compression fractures prominent. This appears new from prior CT of 08/19/2015. Diffuse osteopenia and degenerative change. Moderate gastric distention noted. IMPRESSION: 1. T12 prominent compression fracture noted. This is new from CT of 08/19/2015. T9 compression fracture cannot  be excluded. 2. Diffuse osteopenia degenerative change. 3. Moderate gastric distention. Electronically Signed   By: Maisie Fus  Register   On: 10/26/2015 12:15   Dg Pelvis 1-2 Views  Result Date: 10/25/2015 CLINICAL DATA:  Multiple falls, bilateral pelvic tenderness EXAM: PELVIS - 1-2 VIEW COMPARISON:  CT abdomen pelvis dated 08/19/2015 FINDINGS: No fracture or dislocation is seen. Bilateral hip joint spaces are preserved. Visualized bony pelvis appears intact. Mild degenerative changes of the lower lumbar spine. IMPRESSION: No fracture or dislocation is seen. Electronically Signed   By: Charline Bills M.D.   On: 10/25/2015 13:41   Portable Chest 1 View  Result Date: 11/04/2015 CLINICAL DATA:  Weakness and shortness of breath EXAM: PORTABLE CHEST 1 VIEW COMPARISON:  PA and lateral chest x-ray of November 03, 2015 FINDINGS: The lungs are adequately inflated. The interstitial markings are increased bilaterally. There is no alveolar infiltrate or pleural effusion. The cardiac silhouette is mildly enlarged though stable. The pulmonary vascularity is prominent centrally. There are bibasilar densities consistent with atelectasis or early infiltrate. IMPRESSION: COPD with pulmonary fibrotic changes. Superimposed bibasilar atelectasis or pneumonia. Mild pulmonary vascular congestion suggests low-grade CHF. Electronically Signed   By: David  [place] M.D.   On: 11/04/2015 07:21       ELGERGAWY, DAWOOD M.D on 11/09/2015 at 12:11 PM  Between 7am to 7pm - Pager - [phone]  After 7pm go to [email or web address] - password [facility]  [facility] -  Office  [phone]

## 2015-11-09 NOTE — Progress Notes (Signed)
Occupational Therapy Treatment Patient Details Name: [name] Morningddie Ruegg Jr. MRN: [id number] DOB: 1940/08/20 Today's Date: 11/09/2015    History of present illness 75 y.o. male with medical history significant for chronic diastolic CHF, and anxiety who presents to the emergency department 10/1 from his SNF with acute respiratory distress. Patient was just discharged from the hospital 9/30 after a 3 day admission for management of fatigue, loss of appetite, and AKI   OT comments  Progressing slowly towards OT goals. Continue OT per plan of care.  Follow Up Recommendations  SNF    Equipment Recommendations  None recommended by OT    Recommendations for Other Services      Precautions / Restrictions Precautions Precautions: Fall Precaution Comments: O2 dependent Restrictions Weight Bearing Restrictions: No       Mobility Bed Mobility                  Transfers                      Balance                                   ADL Overall ADL's : Needs assistance/impaired Eating/Feeding: Independent;Bed level                                     General ADL Comments: Patient eating lunch upon arrival Energy conservation handout issued to patient and began going over techniques with patient. Patient reports he sat up in recliner yesterday, has been using BSC with nursing, and got a bath earlier. He also reports that he thinks he is going back to SNF tomorrow. OT will continue to follow in the acute setting.      Vision                     Perception     Praxis      Cognition   Behavior During Therapy: WFL for tasks assessed/performed Overall Cognitive Status: Within Functional Limits for tasks assessed                       Extremity/Trunk Assessment               Exercises     Shoulder Instructions       General Comments      Pertinent Vitals/ Pain       Pain Assessment: No/denies pain  Home  Living                                          Prior Functioning/Environment              Frequency  Min 2X/week        Progress Toward Goals  OT Goals(current goals can now be found in the care plan section)  Progress towards OT goals: Progressing toward goals     Plan Discharge plan remains appropriate    Co-evaluation                 End of Session Equipment Utilized During Treatment: Oxygen   Activity Tolerance Patient limited by fatigue   Patient Left in bed;with call bell/phone within reach  Nurse Communication          Time: 1610-9604 OT Time Calculation (min): 12 min  Charges: OT General Charges $OT Visit: 1 Procedure OT Treatments $Self Care/Home Management : 8-22 mins  [name] [name] A 11/09/2015, 12:05 PM

## 2015-11-10 DIAGNOSIS — D508 Other iron deficiency anemias: Secondary | ICD-10-CM

## 2015-11-10 LAB — LEGIONELLA PNEUMOPHILA SEROGP 1 UR AG: L. PNEUMOPHILA SEROGP 1 UR AG: NEGATIVE

## 2015-11-10 MED ORDER — PREDNISONE 20 MG PO TABS: 20.0000 mg | ORAL_TABLET | Freq: Every day | ORAL | 0 refills | Status: AC

## 2015-11-10 MED ORDER — TRAMADOL HCL 50 MG PO TABS: 50.0000 mg | ORAL_TABLET | Freq: Every day | ORAL | 0 refills | Status: AC | PRN

## 2015-11-10 MED ORDER — LEVOFLOXACIN 750 MG PO TABS: 750.0000 mg | ORAL_TABLET | Freq: Every day | ORAL | 0 refills | Status: AC

## 2015-11-10 MED ORDER — ENSURE ENLIVE PO LIQD: 237.0000 mL | Freq: Two times a day (BID) | ORAL | 12 refills | Status: AC

## 2015-11-10 MED ORDER — ALPRAZOLAM 0.5 MG PO TABS: 0.5000 mg | ORAL_TABLET | Freq: Four times a day (QID) | ORAL | 0 refills | Status: AC | PRN

## 2015-11-10 NOTE — Clinical Social Work Note (Signed)
Patient's daughter in law her to pick up patient and transport back to [facility].  Elray Buba.Tayon Parekh, LCSW [facility] Clinical Social Worker - cell [phone]

## 2015-11-10 NOTE — Clinical Social Work Note (Signed)
CSW spoke with patient's daughter in law regarding patient discharge to coordinate patient's transition back to rehab at [facility].  CSW prepared documents and sent through the HUB.  CSW spoke with [facility] and coordinated family to sign the intake paperwork between 3 and 3:30 today. Patient's daughter in law will pick patient up and transport to SNF between 2:30 and 3:00 today.    Elray Buba.Avah Bashor, LCSW [facility] Clinical Social Worke cell #: [phone]

## 2015-11-10 NOTE — Progress Notes (Signed)
Pt plan to dc back to [facility], CSW following.

## 2015-11-10 NOTE — Progress Notes (Signed)
[facility] All questions answered. Patient to be transported by daughter in law. Julio SicksK. Deavion Dobbs RN

## 2015-11-10 NOTE — Discharge Summary (Signed)
Physician Discharge Summary  [name] [name]. AVW:[id number] DOB: Dec 28, 1940 DOA: 11/07/2015  PCP: [name], NP  Admit date: 11/07/2015 Discharge date: 11/10/2015  Admitted From: SNF Disposition:  Skilled nursing facility ([facility])  Recommendations for Outpatient Follow-up:  Follow up with MD at SNF in 1 week. Completes antibiotics course on 11/13/2015  Home Health: None Equipment/Devices: Oxygen 3 L/m, further recommendations per PT at the facility.  Discharge Condition: Fair CODE STATUS: Full code Diet recommendation: Heart Healthy     Discharge Diagnoses:  Principal Problem:   HCAP (healthcare-associated pneumonia)   Active Problems:   Essential hypertension   GERD   Acute on chronic respiratory failure with hypoxia (HCC)   COPD exacerbation (HCC)   Iron deficiency anemia   Chronic diastolic CHF (congestive heart failure) (HCC)   Brief narrative/history of present illness Please refer to admission H&P for details, in brief, 75 year old male with a sterile diastolic CHF, anxiety, iron deficiency anemia presented to the ED from SNF with acute respiratory distress. He was discharged from the hospital only 3 days prior with generalized weakness, acute kidney injury. Patient reports having cough shortly after returning back to SNF and symptoms progressed to productive cough with thick white sputum. He was increasingly dyspneic not improved with nebulized breathing treatment. Patient brought to the ED by EMS where he was tachycardic to 110s, required NRB and was afebrile. Chest x-ray showed increased basilar atelectasis versus pneumonia. Patient admitted with acute on chronic hypoxic respiratory failure secondary to healthcare associated pneumonia and COPD exacerbation.  Hospital course Acute on chronic hypoxic respiratory failure Secondary to healthcare associated pneumonia and COPD exacerbation. Placed on empiric vancomycin and cefepime. Both blood and sputum  cultures have been negative. Strep pneumonia antigen was negative as well. Patient started on empiric Solu-Medrol and scheduled nebs. Symptoms much improved and maintaining O2 sat on 3 L via nasal cannula (home regimen) Patient will be discharged back to skilled nursing facility on oral Levaquin to complete 5 days of antibiotics. He will also be discharged on oral prednisone taper over the next 12 days. Continue home inhalers and nebulizer as needed. Resume continuous home O2 (3 L/m)  Chronic diastolic CHF Euvolemic. Resumed Lasix. 2-D echo done 2 months back with EF of 55-60% with grade 1 diastolic dysfunction.  Iron deficiency anemia Continue iron supplement. Follow-up as outpatient.  GERD Continue PPI   Patient is clinically stable to be discharged back to skilled nursing facility.  Consults: None  Family communication: None at bedside    Discharge Instructions     Medication List    TAKE these medications   acetaminophen 325 MG tablet Commonly known as:  TYLENOL Take 2 tablets (650 mg total) by mouth every 6 (six) hours as needed for mild pain (or Fever >/= 101).   albuterol 0.63 MG/3ML nebulizer solution Commonly known as:  ACCUNEB Take 1 ampule by nebulization every 6 (six) hours as needed for wheezing.   albuterol 108 (90 Base) MCG/ACT inhaler Commonly known as:  PROVENTIL HFA;VENTOLIN HFA Inhale 2 puffs into the lungs every 6 (six) hours as needed for wheezing or shortness of breath.   ALPRAZolam 0.5 MG tablet Commonly known as:  XANAX Take 1 tablet (0.5 mg total) by mouth 4 (four) times daily as needed for anxiety.   ANTACID PO Take 1 tablet by mouth every 4 (four) hours as needed (for acid reflux or heartburn).   aspirin 81 MG EC tablet Take 1 tablet (81 mg total) by mouth daily.  atorvastatin 40 MG tablet Commonly known as:  LIPITOR Take 1 tablet (40 mg total) by mouth daily at 6 PM.   dextromethorphan-guaiFENesin 30-600 MG 12hr tablet Commonly  known as:  MUCINEX DM Take 1 tablet by mouth 2 (two) times daily. What changed:  when to take this  reasons to take this   feeding supplement (ENSURE ENLIVE) Liqd Take 237 mLs by mouth 2 (two) times daily between meals.   furosemide 40 MG tablet Commonly known as:  LASIX Take 1 tablet (40 mg total) by mouth daily.   ipratropium-albuterol 0.5-2.5 (3) MG/3ML Soln Commonly known as:  DUONEB Take 3 mLs by nebulization every 6 (six) hours as needed. What changed:  reasons to take this   Iron 142 (45 Fe) MG Tbcr Take 1 tablet by mouth 3 (three) times a week.   levofloxacin 750 MG tablet Commonly known as:  LEVAQUIN Take 1 tablet (750 mg total) by mouth daily. Start taking on:  11/11/2015   mometasone-formoterol 200-5 MCG/ACT Aero Commonly known as:  DULERA Inhale 2 puffs into the lungs 2 (two) times daily.   ondansetron 8 MG tablet Commonly known as:  ZOFRAN Take 8 mg by mouth every 8 (eight) hours as needed for nausea or vomiting.   pantoprazole 40 MG tablet Commonly known as:  PROTONIX Take 1 tablet (40 mg total) by mouth 2 (two) times daily.   polyethylene glycol packet Commonly known as:  MIRALAX / GLYCOLAX Take 17 g by mouth daily as needed for mild constipation.   potassium chloride 10 MEQ tablet Commonly known as:  K-DUR,KLOR-CON Take 1 tablet (10 mEq total) by mouth daily.   predniSONE 20 MG tablet Commonly known as:  DELTASONE Take 1 tablet (20 mg total) by mouth daily with breakfast.   sodium chloride 0.65 % Soln nasal spray Commonly known as:  OCEAN Place 1 spray into the nose as needed for congestion (Dry nasal passages).   SPIRIVA HANDIHALER 18 MCG inhalation capsule Generic drug:  tiotropium Place 1 puff into inhaler and inhale daily as needed for wheezing.   traMADol 50 MG tablet Commonly known as:  ULTRAM Take 1 tablet (50 mg total) by mouth daily as needed for moderate pain.   vitamin E 100 UNIT capsule Take 100 Units by mouth daily as  needed. supplement      Follow-up Information    MD at SNF in 1 week Follow up in 1 week(s).          Allergies  Allergen Reactions  . Adhesive [Tape]     Tears skin, prefers paper tape  . Advair Diskus [Fluticasone-Salmeterol] Other (See Comments)    Blurred vision  . Brovana [Arformoterol]     Ineffective--exacerbated coughing/congestion  . Codeine Other (See Comments)    Hallucinations, blurred vision  . Ferrous Sulfate Other (See Comments)    Stomach pain  . Other Swelling    Bicillin injection in 1961--swelling in lower extremities and joints, and whelps up arm.  Has taken penicillin since w/o reaction.  . Prednisone Other (See Comments)    Stomach pain     Procedures/Studies: Dg Chest 2 View  Result Date: 11/07/2015 CLINICAL DATA:  Shortness of breath starting at 0100 hours this [name]. EXAM: CHEST  2 VIEW COMPARISON:  11/04/2015 FINDINGS: Normal heart size and pulmonary vascularity. Shallow inspiration. Small bilateral pleural effusions. Developing consolidation in the lung bases, greater on the left. Pulmonary fibrosis with peribronchial thickening suggesting chronic bronchitis. No pneumothorax. IMPRESSION: Small bilateral pleural effusions with  increasing basilar atelectasis or consolidation. Changes may indicate pneumonia. Fibrosis and chronic bronchitic changes in the lungs. Electronically Signed   By: Burman Nieves M.D.   On: 11/07/2015 03:12   Dg Chest 2 View  Result Date: 11/03/2015 CLINICAL DATA:  Shortness of breath.  Multiple recent falls EXAM: CHEST  2 VIEW COMPARISON:  October 25, 2015 and May 06, 2015 Findings coal Scarring is noted bilaterally, most notably in the right mid lung and left base regions. There is somewhat diffuse interstitial thickening which is stable. There is no frank edema or consolidation. Heart size and pulmonary vascularity are normal. There is atherosclerotic calcification in the aorta. No adenopathy. Anterior wedging of several  vertebral bodies stable. There is mild new anterior wedging of the T6 vertebral body. IMPRESSION: Mild anterior wedging of the T6 vertebral body, new from most recent study. Anterior wedging of other thoracic vertebral bodies is stable. Anterior wedging is most marked at T12. There is chronic scarring and interstitial thickening, stable. No edema or consolidation. Stable cardiac silhouette. Electronically Signed   By: Bretta Bang III M.D.   On: 11/03/2015 11:50   Dg Chest 2 View  Result Date: 10/25/2015 CLINICAL DATA:  Shortness of Breath EXAM: CHEST  2 VIEW COMPARISON:  05/06/2015 FINDINGS: Cardiac shadow is within normal limits. Diffuse interstitial changes are again identified but stable from the prior exam consistent with underlying scarring. No focal infiltrate or sizable effusion is seen. Compression deformities are again noted in the mid and lower thoracic spine. Some of these have progressed in the interval from the prior exam. Correlation with point tenderness is recommended. IMPRESSION: Chronic interstitial changes. Compression deformities of the thoracic spine which have progressed in the interval from the prior exam. Correlation to point tenderness is recommended. Electronically Signed   By: Alcide Clever M.D.   On: 10/25/2015 15:46   Dg Lumbar Spine 2-3 Views  Result Date: 10/26/2015 CLINICAL DATA:  Fall. EXAM: LUMBAR SPINE - 2-3 VIEW COMPARISON:  CT 08/19/2015. FINDINGS: T12 compression fracture is noted. Compression fractures prominent. This appears new from prior CT of 08/19/2015. Diffuse osteopenia and degenerative change. Moderate gastric distention noted. IMPRESSION: 1. T12 prominent compression fracture noted. This is new from CT of 08/19/2015. T9 compression fracture cannot be excluded. 2. Diffuse osteopenia degenerative change. 3. Moderate gastric distention. Electronically Signed   By: Maisie Fus  Register   On: 10/26/2015 12:15   Dg Pelvis 1-2 Views  Result Date:  10/25/2015 CLINICAL DATA:  Multiple falls, bilateral pelvic tenderness EXAM: PELVIS - 1-2 VIEW COMPARISON:  CT abdomen pelvis dated 08/19/2015 FINDINGS: No fracture or dislocation is seen. Bilateral hip joint spaces are preserved. Visualized bony pelvis appears intact. Mild degenerative changes of the lower lumbar spine. IMPRESSION: No fracture or dislocation is seen. Electronically Signed   By: Charline Bills M.D.   On: 10/25/2015 13:41   Portable Chest 1 View  Result Date: 11/04/2015 CLINICAL DATA:  Weakness and shortness of breath EXAM: PORTABLE CHEST 1 VIEW COMPARISON:  PA and lateral chest x-ray of November 03, 2015 FINDINGS: The lungs are adequately inflated. The interstitial markings are increased bilaterally. There is no alveolar infiltrate or pleural effusion. The cardiac silhouette is mildly enlarged though stable. The pulmonary vascularity is prominent centrally. There are bibasilar densities consistent with atelectasis or early infiltrate. IMPRESSION: COPD with pulmonary fibrotic changes. Superimposed bibasilar atelectasis or pneumonia. Mild pulmonary vascular congestion suggests low-grade CHF. Electronically Signed   By: David  [place] M.D.   On: 11/04/2015 07:21  Subjective: Dyspnea much better today. Feels at baseline.  Discharge Exam: Vitals:   11/09/15 2053 11/10/15 0449  BP: 121/81 107/73  Pulse: (!) 102 98  Resp: 18 20  Temp: 98.8 F (37.1 C) 99.1 F (37.3 C)   Vitals:   11/09/15 2057 11/09/15 2059 11/10/15 0449 11/10/15 0500  BP:   107/73   Pulse:   98   Resp:   20   Temp:   99.1 F (37.3 C)   TempSrc:   Oral   SpO2: 96% 96% 96%   Weight:    76.9 kg (169 lb 8.5 oz)  Height:        General:Elderly male not in distress HEENT: Pallor present, moist mucosa, supple neck Cardiovascular: RRR, S1/S2 +, no rubs, no gallops Chest: Few scattered rhonchi bilaterally, no wheezing or crackles GI Soft, NT, ND, bowel sounds + Musculoskeletal: Warm, no edema CNS:  Alert and oriented    The results of significant diagnostics from this hospitalization (including imaging, microbiology, ancillary and laboratory) are listed below for reference.     Microbiology: Recent Results (from the past 240 hour(s))  Culture, blood (Routine X 2) w Reflex to ID Panel     Status: None (Preliminary result)   Collection Time: 11/07/15  3:31 AM  Result Value Ref Range Status   Specimen Description BLOOD LEFT HAND  Final   Special Requests BOTTLES DRAWN AEROBIC AND ANAEROBIC 5 CC  Final   Culture   Final    NO GROWTH 2 DAYS [facility]    Report Status PENDING  Incomplete  Culture, blood (Routine X 2) w Reflex to ID Panel     Status: None (Preliminary result)   Collection Time: 11/07/15  3:36 AM  Result Value Ref Range Status   Specimen Description BLOOD RIGHT HAND  Final   Special Requests BOTTLES DRAWN AEROBIC AND ANAEROBIC 5 CC  Final   Culture   Final    NO GROWTH 2 DAYS [facility]    Report Status PENDING  Incomplete  MRSA PCR Screening     Status: None   Collection Time: 11/07/15  5:28 AM  Result Value Ref Range Status   MRSA by PCR NEGATIVE NEGATIVE Final    Comment:        The GeneXpert MRSA Assay (FDA approved for NASAL specimens only), is one component of a comprehensive MRSA colonization surveillance program. It is not intended to diagnose MRSA infection nor to guide or monitor treatment for MRSA infections.      Labs: BNP (last 3 results)  Recent Labs  10/25/15 1022 11/03/15 1050 11/07/15 0243  BNP 111.1* 162.9* 168.8*   Basic Metabolic Panel:  Recent Labs Lab 11/03/15 1722 11/04/15 0456 11/05/15 0512 11/07/15 0243 11/08/15 0434  NA  --  135 136 138 139  K  --  2.8* 3.1* 4.2 4.9  CL  --  90* 93* 97* 100*  CO2  --  38* 34* 34* 30  GLUCOSE  --  85 94 108* 146*  BUN  --  19 14 12 17   CREATININE  --  0.90 0.68 0.74 0.77  CALCIUM  --  8.5* 8.9 9.1 9.1  MG 2.4  --  1.8  --   --    PHOS 1.5*  --   --   --   --    Liver Function Tests:  Recent Labs Lab 11/07/15 0243  AST 23  ALT 16*  ALKPHOS 82  BILITOT 0.7  PROT 6.0*  ALBUMIN 2.9*   No results for input(s): LIPASE, AMYLASE in the last 168 hours. No results for input(s): AMMONIA in the last 168 hours. CBC:  Recent Labs Lab 11/04/15 0456 11/07/15 0243 11/08/15 0434  WBC 8.1 12.6* 12.2*  NEUTROABS 6.7 10.6*  --   HGB 9.1* 10.3* 9.3*  HCT 30.7* 35.7* 32.3*  MCV 85.0 88.8 89.0  PLT 302 304 302   Cardiac Enzymes:  Recent Labs Lab 11/03/15 1722 11/03/15 2349 11/04/15 0456  TROPONINI 0.06* 0.04* 0.05*   BNP: Invalid input(s): POCBNP CBG: No results for input(s): GLUCAP in the last 168 hours. D-Dimer No results for input(s): DDIMER in the last 72 hours. Hgb A1c No results for input(s): HGBA1C in the last 72 hours. Lipid Profile No results for input(s): CHOL, HDL, LDLCALC, TRIG, CHOLHDL, LDLDIRECT in the last 72 hours. Thyroid function studies No results for input(s): TSH, T4TOTAL, T3FREE, THYROIDAB in the last 72 hours.  Invalid input(s): FREET3 Anemia work up No results for input(s): VITAMINB12, FOLATE, FERRITIN, TIBC, IRON, RETICCTPCT in the last 72 hours. Urinalysis    Component Value Date/Time   COLORURINE YELLOW 10/25/2015 1210   APPEARANCEUR CLEAR 10/25/2015 1210   LABSPEC 1.013 10/25/2015 1210   PHURINE 7.5 10/25/2015 1210   GLUCOSEU NEGATIVE 10/25/2015 1210   HGBUR NEGATIVE 10/25/2015 1210   BILIRUBINUR NEGATIVE 10/25/2015 1210   KETONESUR NEGATIVE 10/25/2015 1210   PROTEINUR NEGATIVE 10/25/2015 1210   UROBILINOGEN 0.2 10/29/2013 2011   NITRITE NEGATIVE 10/25/2015 1210   LEUKOCYTESUR NEGATIVE 10/25/2015 1210   Sepsis Labs Invalid input(s): PROCALCITONIN,  WBC,  LACTICIDVEN Microbiology Recent Results (from the past 240 hour(s))  Culture, blood (Routine X 2) w Reflex to ID Panel     Status: None (Preliminary result)   Collection Time: 11/07/15  3:31 AM  Result Value  Ref Range Status   Specimen Description BLOOD LEFT HAND  Final   Special Requests BOTTLES DRAWN AEROBIC AND ANAEROBIC 5 CC  Final   Culture   Final    NO GROWTH 2 DAYS Performed at [facility]    Report Status PENDING  Incomplete  Culture, blood (Routine X 2) w Reflex to ID Panel     Status: None (Preliminary result)   Collection Time: 11/07/15  3:36 AM  Result Value Ref Range Status   Specimen Description BLOOD RIGHT HAND  Final   Special Requests BOTTLES DRAWN AEROBIC AND ANAEROBIC 5 CC  Final   Culture   Final    NO GROWTH 2 DAYS Performed at [facility]    Report Status PENDING  Incomplete  MRSA PCR Screening     Status: None   Collection Time: 11/07/15  5:28 AM  Result Value Ref Range Status   MRSA by PCR NEGATIVE NEGATIVE Final    Comment:        The GeneXpert MRSA Assay (FDA approved for NASAL specimens only), is one component of a comprehensive MRSA colonization surveillance program. It is not intended to diagnose MRSA infection nor to guide or monitor treatment for MRSA infections.      Time coordinating discharge: Over 30 minutes  SIGNED:   Admir North, MD  [facility] 11/10/2015, 12:57 PM Pager   If 7PM-7AM, please contact night-coverage [email or web address] Password [email or web address]

## 2015-11-12 LAB — CULTURE, BLOOD (ROUTINE X 2)
Culture: NO GROWTH
Culture: NO GROWTH

## 2016-01-06 ENCOUNTER — Institutional Professional Consult (permissible substitution): Payer: Medicare Other | Admitting: Pulmonary Disease

## 2016-02-11 ENCOUNTER — Ambulatory Visit (INDEPENDENT_AMBULATORY_CARE_PROVIDER_SITE_OTHER): Payer: Medicare Other | Admitting: Neurology

## 2016-02-11 ENCOUNTER — Encounter: Payer: Self-pay | Admitting: Neurology

## 2016-02-11 VITALS — BP 137/78 | HR 102 | Ht 67.5 in | Wt 151.0 lb

## 2016-02-11 DIAGNOSIS — F05 Delirium due to known physiological condition: Secondary | ICD-10-CM

## 2016-02-11 DIAGNOSIS — E538 Deficiency of other specified B group vitamins: Secondary | ICD-10-CM

## 2016-02-11 DIAGNOSIS — R410 Disorientation, unspecified: Secondary | ICD-10-CM

## 2016-02-11 DIAGNOSIS — G479 Sleep disorder, unspecified: Secondary | ICD-10-CM

## 2016-02-11 NOTE — Patient Instructions (Signed)
We will get blood work and a CT of the head, get a sleep evaluation.

## 2016-02-11 NOTE — Progress Notes (Signed)
Reason for visit: Confusion  Referring physician: Dr. Sherrye Payor Lyrik Buresh. is a 76 y.o. male  History of present illness:  Mr. Pettry is a 76 year old right-handed white male with a history of COPD. The patient has required multiple hospitalizations because of his breathing issues over the last couple years. The patient was recently in the hospital around October 2017 with an exacerbation of his COPD. The patient indicates that since he has been out of the hospital, he has had increasing problems with sleeping. The patient will sleep for about an hour or 90 minutes, then wake up for a period of time before he can get back to sleep. The patient is not sleeping for an extended period of time. The patient will have difficulty staying awake during the day as well, he will once again take naps off and on throughout the day. After coming out of the hospital, he has developed episodes where he will think that he has done something such as eat a meal, but then he will realize later that the episode never happened. This appears to recur with some regularity involving things that he may have done. The patient may eat something in the kitchen, and then he will think that he has eaten multiple times. The patient does have a history of migraine headaches, he will wake up with a headache on occasion. The patient is on oxygen at all times. The patient was discharged on prednisone coming out of the hospital and he was placed on Lipitor, these medications are new for him. The patient does not take the prednisone on a daily basis. The patient denies any new numbness or weakness of the face, arms, or legs. He does have some gait instability, he uses a walker for ambulation. He denies issues controlling the bladder, but he does have some chronic constipation issues. The patient denies any falls. He is sent to this office for further evaluation. The family reports there have been some mild general memory issues,  this has not been severe. Prior to going into the hospital, the patient was sleeping much better.    Past Medical History:  Diagnosis Date  . Acute respiratory failure with hypoxia (HCC) 01/2014  . Chronic bronchitis    "get it q yr" (07/17/2013)  . Chronic diastolic CHF (congestive heart failure) (HCC)   . COPD (chronic obstructive pulmonary disease) (HCC)   . Daily headache   . DVT (deep venous thrombosis) (HCC) 1990's   "?LLE"  . GERD (gastroesophageal reflux disease)    "related to RX I take" (07/17/2013)  . Hypertension   . Migraine    "all my life; no really bad migraines in 8-9 since work stress is gone" (07/17/2013)  . On home oxygen therapy    "2L 24/7" (08/19/2015)  . Pneumonia    "several times"    Past Surgical History:  Procedure Laterality Date  . CATARACT EXTRACTION W/ INTRAOCULAR LENS IMPLANT Right 05/05/2015  . INGUINAL HERNIA REPAIR Left ~ 1990  . TONSILLECTOMY  1949    Family History  Problem Relation Age of Onset  . Breast cancer Mother   . Alzheimer's disease Father   . Alzheimer's disease Sister   . Tremor Brother     Social history:  reports that he quit smoking about 18 years ago. His smoking use included Cigarettes. He has a 10.00 pack-year smoking history. He has never used smokeless tobacco. He reports that he does not drink alcohol or use drugs.  Medications:  Prior to Admission medications   Medication Sig Start Date End Date Taking? Authorizing Provider  acetaminophen (TYLENOL) 325 MG tablet Take 2 tablets (650 mg total) by mouth every 6 (six) hours as needed for mild pain (or Fever >/= 101). 11/06/15  Yes Alison Murray, MD  albuterol (ACCUNEB) 0.63 MG/3ML nebulizer solution Take 1 ampule by nebulization every 6 (six) hours as needed for wheezing.   Yes Historical Provider, MD  albuterol (PROVENTIL HFA;VENTOLIN HFA) 108 (90 BASE) MCG/ACT inhaler Inhale 2 puffs into the lungs every 6 (six) hours as needed for wheezing or shortness of breath. 04/13/14   Yes Storm Frisk, MD  ALPRAZolam Prudy Feeler) 0.5 MG tablet Take 1 tablet (0.5 mg total) by mouth 4 (four) times daily as needed for anxiety. 11/10/15  Yes Nishant Dhungel, MD  aspirin EC 81 MG EC tablet Take 1 tablet (81 mg total) by mouth daily. 10/28/15  Yes Wendee Beavers, DO  atorvastatin (LIPITOR) 40 MG tablet Take 1 tablet (40 mg total) by mouth daily at 6 PM. 10/27/15  Yes Wendee Beavers, DO  Calcium Carbonate Antacid (ANTACID PO) Take 1 tablet by mouth every 4 (four) hours as needed (for acid reflux or heartburn).    Yes Historical Provider, MD  dextromethorphan-guaiFENesin (MUCINEX DM) 30-600 MG 12hr tablet Take 1 tablet by mouth 2 (two) times daily. Patient taking differently: Take 1 tablet by mouth 2 (two) times daily as needed for cough.  05/10/15  Yes Ruben Im, MD  feeding supplement, ENSURE ENLIVE, (ENSURE ENLIVE) LIQD Take 237 mLs by mouth 2 (two) times daily between meals. 11/10/15  Yes Nishant Dhungel, MD  furosemide (LASIX) 40 MG tablet Take 1 tablet (40 mg total) by mouth daily. 11/06/15  Yes Alison Murray, MD  ipratropium-albuterol (DUONEB) 0.5-2.5 (3) MG/3ML SOLN Take 3 mLs by nebulization every 6 (six) hours as needed. Patient taking differently: Take 3 mLs by nebulization every 6 (six) hours as needed (for wheezing or shortness of breath).  05/12/14  Yes Albertine Grates, MD  mometasone-formoterol [facility]) 200-5 MCG/ACT AERO Inhale 2 puffs into the lungs 2 (two) times daily.   Yes Historical Provider, MD  ondansetron (ZOFRAN) 8 MG tablet Take 8 mg by mouth every 8 (eight) hours as needed for nausea or vomiting.  08/19/15  Yes Historical Provider, MD  pantoprazole (PROTONIX) 40 MG tablet Take 1 tablet (40 mg total) by mouth 2 (two) times daily. 11/01/13  Yes Vassie Loll, MD  polyethylene glycol [facility] / GLYCOLAX) packet Take 17 g by mouth daily as needed for mild constipation. 08/23/15  Yes Zannie Cove, MD  potassium chloride SA (K-DUR,KLOR-CON) 10 MEQ tablet Take 1 tablet (10 mEq  total) by mouth daily. 11/06/15  Yes Alison Murray, MD  predniSONE (DELTASONE) 20 MG tablet Take 1 tablet (20 mg total) by mouth daily with breakfast. Patient taking differently: Take 20 mg by mouth daily as needed.  11/10/15  Yes Nishant Dhungel, MD  sodium chloride (OCEAN) 0.65 % SOLN nasal spray Place 1 spray into the nose as needed for congestion (Dry nasal passages).   Yes Historical Provider, MD  SPIRIVA HANDIHALER 18 MCG inhalation capsule Place 1 puff into inhaler and inhale daily as needed for wheezing. 09/07/15  Yes Historical Provider, MD  traMADol (ULTRAM) 50 MG tablet Take 1 tablet (50 mg total) by mouth daily as needed for moderate pain. 11/10/15  Yes Nishant Dhungel, MD  vitamin E 100 UNIT capsule Take 100 Units by mouth daily as  needed. supplement   Yes Historical Provider, MD      Allergies  Allergen Reactions  . Adhesive [Tape]     Tears skin, prefers paper tape  . Advair Diskus [Fluticasone-Salmeterol] Other (See Comments)    Blurred vision  . Brovana [Arformoterol]     Ineffective--exacerbated coughing/congestion  . Codeine Other (See Comments)    Hallucinations, blurred vision  . Ferrous Sulfate Other (See Comments)    Stomach pain  . Other Swelling    Bicillin injection in 1961--swelling in lower extremities and joints, and whelps up arm.  Has taken penicillin since w/o reaction.  . Prednisone Other (See Comments)    Stomach pain    ROS:  Out of a complete 14 system review of symptoms, the patient complains only of the following symptoms, and all other reviewed systems are negative.  Hearing loss, ringing in the ears  Shortness of breath, cough  Easy bruising, easy bleeding  Feeling cold  Skin sensitivity  Memory loss, confusion, headache  Sleep disorder  Sleepiness, restless legs    Blood pressure 137/78, pulse (!) 102, height 5' 7.5" (1.715 m), weight 151 lb (68.5 kg).  Physical Exam  General: The patient is alert and cooperative at the time of the  examination.  Eyes: Pupils are equal, round, and reactive to light. Discs are flat bilaterally.  Neck: The neck is supple, no carotid bruits are noted.  Respiratory: The respiratory examination is clear.  Cardiovascular: The cardiovascular examination reveals a regular rate and rhythm, no obvious murmurs or rubs are noted.  Skin: Extremities are without significant edema.  Neurologic Exam  Mental status: The patient is alert and oriented x 3 at the time of the examination. The patient has apparent normal recent and remote memory, with an apparently normal attention span and concentration ability. Mini-Mental status examination done today shows a total score 29/30.  Cranial nerves: Facial symmetry is present. There is good sensation of the face to pinprick and soft touch bilaterally. The strength of the facial muscles and the muscles to head turning and shoulder shrug are normal bilaterally. Speech is well enunciated, no aphasia or dysarthria is noted. Extraocular movements are full. Visual fields are full. The tongue is midline, and the patient has symmetric elevation of the soft palate. No obvious hearing deficits are noted.  Motor: The motor testing reveals 5 over 5 strength of all 4 extremities. Good symmetric motor tone is noted throughout.  Sensory: Sensory testing is intact to pinprick, soft touch, vibration sensation, and position sense on all 4 extremities. No evidence of extinction is noted.  Coordination: Cerebellar testing reveals good finger-nose-finger and heel-to-shin bilaterally.Some tremors seen with both upper extremities, no asterixis is noted.  Gait and station: Gait is normal. Tandem gait was not tested. Romberg is negative. No drift is seen.  Reflexes: Deep tendon reflexes are symmetric and normal bilaterally. Toes are downgoing bilaterally.   Assessment/Plan:  1. Episodic daydreaming   2. Reported mild memory disturbance   3. COPD   The patient has had some  issues with what sounds like daydreaming on a frequent basis. This correlates with an alteration in his ability to sleep. The patient is only able to sleep for about an hour or an hour and a half of time, then he wakens, he will go back to sleep and the cycle is repeated. The patient may have intrusion of REM sleep into periods of wakefulness. The patient has had some mild memory issues as well. The patient will  be set up for blood work today, he will have a CT scan of the brain, he will be referred for a sleep evaluation. It is not clear whether the patient snores at night, he is on oxygen throughout the day. Evaluating his oxygenation levels during wakefulness and sleep may be important as well.  Marlan Palau MD 02/11/2016 11:30 AM  [facility] [address] [place], [place] [address]  Phone [phone] Fax [phone]

## 2016-02-12 LAB — VITAMIN B12: VITAMIN B 12: 304 pg/mL (ref 232–1245)

## 2016-02-12 LAB — RPR: RPR Ser Ql: NONREACTIVE

## 2016-02-15 ENCOUNTER — Ambulatory Visit
Admission: RE | Admit: 2016-02-15 | Discharge: 2016-02-15 | Disposition: A | Discharge status: Home / Self Care | Payer: Medicare Other | Source: Ambulatory Visit | Attending: Neurology | Admitting: Neurology

## 2016-02-15 DIAGNOSIS — F05 Delirium due to known physiological condition: Principal | ICD-10-CM

## 2016-02-15 DIAGNOSIS — R410 Disorientation, unspecified: Secondary | ICD-10-CM

## 2016-02-17 ENCOUNTER — Telehealth: Payer: Self-pay | Admitting: Neurology

## 2016-02-17 NOTE — Telephone Encounter (Signed)
I called the patient. CT of the head is OK with exception of mild atrophy. Sleep evaluation is pending.  CT head 02/16/16:  IMPRESSION:  Equivocal CT head (without) demonstrating: 1. Mild frontal and perisylvian atrophy. 2. No acute findings.

## 2016-04-13 ENCOUNTER — Ambulatory Visit (INDEPENDENT_AMBULATORY_CARE_PROVIDER_SITE_OTHER): Payer: Medicare Other | Admitting: Orthopedic Surgery

## 2016-04-13 ENCOUNTER — Encounter (INDEPENDENT_AMBULATORY_CARE_PROVIDER_SITE_OTHER): Payer: Self-pay | Admitting: Orthopedic Surgery

## 2016-04-13 ENCOUNTER — Ambulatory Visit (INDEPENDENT_AMBULATORY_CARE_PROVIDER_SITE_OTHER): Payer: Medicare Other

## 2016-04-13 DIAGNOSIS — M25551 Pain in right hip: Secondary | ICD-10-CM

## 2016-04-13 DIAGNOSIS — M545 Low back pain: Secondary | ICD-10-CM

## 2016-04-14 LAB — VITAMIN D 25 HYDROXY (VIT D DEFICIENCY, FRACTURES): Vit D, 25-Hydroxy: 37 ng/mL (ref 30–100)

## 2016-04-16 NOTE — Progress Notes (Signed)
Office Visit Note   Patient: [name] [name].           Date of Birth: 1940-06-11           MRN: [id number] Visit Date: 04/13/2016 Requested by: Marva Panda, NP [facility] [address] [place], [place] [address] PCP: [name], NP  Subjective: Chief Complaint  Patient presents with  . Right Hip - Pain    HPI: [name] [name] is a 76 year old patient with right hip and leg pain.  Denies history of injury.  Pain started 3 weeks ago acutely.  He endplates with a walker.  He denies any numbness or tingling or any radicular pain.  The pain comes and goes but is worse with prolonged standing.  He denies any groin pain.  Says he does describe some pain with bending as well as pain with standing.              ROS: All systems reviewed are negative as they relate to the chief complaint within the history of present illness.  Patient denies  fevers or chills.   Assessment & Plan: Visit Diagnoses:  1. Pain of right hip joint   2. Right low back pain, unspecified chronicity, with sciatica presence unspecified     Plan: Impression is right hip and leg pain which may be related to this possible worsening of a T12 compression fracture which is not new.  I don't see much in terms of progressive kyphosis in this area but that's possible.  Another possibility based on the location of his right-sided sacral soreness is that he has a sacral stress reaction or stress fracture.  Talked to the radiologist they believe that MRI L-spine will show sacral fracture if it's present.  I want to obtain that as well as obtain vitamin D level.  All see him back after those studies  Follow-Up Instructions: No Follow-up on file.   Orders:  Orders Placed This Encounter  Procedures  . XR Lumbar Spine 2-3 Views  . MR Lumbar Spine w/o contrast  . Vitamin D (25 hydroxy)   No orders of the defined types were placed in this encounter.     Procedures: No procedures  performed   Clinical Data: No additional findings.  Objective: Vital Signs: There were no vitals taken for this visit.  Physical Exam:   Constitutional: Patient appears well-developed HEENT:  Head: Normocephalic Eyes:EOM are normal Neck: Normal range of motion Cardiovascular: Normal rate Pulmonary/chest: Effort normal Neurologic: Patient is alert Skin: Skin is warm Psychiatric: Patient has normal mood and affect    Ortho Exam: Orthopedic exam demonstrates that the patient is on home oxygen.  He does have some difficulty with standing but has excellent ankle dorsiflexion plantar flexion quite hamstring strength with no upper motor neuron signs or symptoms.  Negative Babinski negative clonus no groin pain with internal/external rotation of either leg.  Does have tenderness around the sacroiliac joint to direct palpation on the right-hand side but not the left-hand side.  No masses lymph adenopathy or skin changes noted in the back region.  No dermatomal rashes are present.  Specialty Comments:  No specialty comments available.  Imaging: No results found.   PMFS History: Patient Active Problem List   Diagnosis Date Noted  . Sleep disturbance 02/11/2016  . Subacute confusional state 02/11/2016  . HCAP (healthcare-associated pneumonia) 11/07/2015  . Acute kidney injury (HCC) 11/03/2015  . Hyponatremia 11/03/2015  . Falls   . Shortness of breath  10/25/2015  . Abdominal pain 08/19/2015  . Elevated troponin 08/19/2015  . Chest pressure 08/19/2015  . Chronic diastolic CHF (congestive heart failure) (HCC)   . Troponin level elevated   . B12 deficiency 05/12/2015  . Iron deficiency anemia   . Abdominal distension   . COPD exacerbation (HCC) 05/06/2015  . Influenza B 05/06/2015  . Normocytic anemia 05/06/2015  . Cataract of right eye 05/06/2015  . COPD (chronic obstructive pulmonary disease) (HCC) 05/09/2014  . Acute on chronic respiratory failure with hypoxia (HCC)  05/09/2014  . Influenza A H1N1 infection 05/09/2014  . AKI (acute kidney injury) (HCC) 05/09/2014  . Dehydration 05/09/2014  . Lobar pneumonia due to unspecified organism 05/05/2014  . Tobacco abuse 01/19/2014  . History of DVT (deep vein thrombosis)   . On home oxygen therapy   . Hypokalemia 10/29/2013  . GERD 12/25/2008  . Essential hypertension 11/09/2008  . 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 Acute respiratory failure with hypoxia (HCC) 01/2014  . Chronic bronchitis    "get it q yr" (07/17/2013)  . Chronic diastolic CHF (congestive heart failure) (HCC)   . COPD (chronic obstructive pulmonary disease) (HCC)   . Daily headache   . DVT (deep venous thrombosis) (HCC) 1990's   "?LLE"  . GERD (gastroesophageal reflux disease)    "related to RX I take" (07/17/2013)  . Hypertension   . Migraine    "all my life; no really bad migraines in 8-9 since work stress is gone" (07/17/2013)  . On home oxygen therapy    "2L 24/7" (08/19/2015)  . Pneumonia    "several times"    Family History  Problem Relation Age of Onset  . Breast cancer Mother   . Alzheimer's disease Father   . Alzheimer's disease Sister   . Tremor Brother     Past Surgical History:  Procedure Laterality Date  . CATARACT EXTRACTION W/ INTRAOCULAR LENS IMPLANT Right 05/05/2015  . INGUINAL HERNIA REPAIR Left ~ 1990  . TONSILLECTOMY  1949   Social History   Occupational History  . Retired     [employer]   Social History Main Topics  . Smoking status: Former Smoker    Packs/day: 1.00    Years: 10.00    Types: Cigarettes    Quit date: 02/06/1998  . Smokeless tobacco: Never Used  . Alcohol use No  . Drug use: No  . Sexual activity: No

## 2016-04-19 ENCOUNTER — Other Ambulatory Visit (INDEPENDENT_AMBULATORY_CARE_PROVIDER_SITE_OTHER): Payer: Self-pay | Admitting: Orthopedic Surgery

## 2016-04-19 DIAGNOSIS — Z77018 Contact with and (suspected) exposure to other hazardous metals: Secondary | ICD-10-CM

## 2016-04-19 DIAGNOSIS — M545 Low back pain: Secondary | ICD-10-CM

## 2016-04-27 ENCOUNTER — Ambulatory Visit
Admission: RE | Admit: 2016-04-27 | Discharge: 2016-04-27 | Disposition: A | Discharge status: Home / Self Care | Payer: Medicare Other | Source: Ambulatory Visit | Attending: Orthopedic Surgery | Admitting: Orthopedic Surgery

## 2016-04-27 DIAGNOSIS — M545 Low back pain: Secondary | ICD-10-CM

## 2016-04-27 DIAGNOSIS — Z77018 Contact with and (suspected) exposure to other hazardous metals: Secondary | ICD-10-CM

## 2016-05-03 ENCOUNTER — Other Ambulatory Visit: Payer: Self-pay | Admitting: Neurology

## 2016-05-03 ENCOUNTER — Institutional Professional Consult (permissible substitution): Payer: Medicare Other | Admitting: Neurology

## 2016-05-03 DIAGNOSIS — G479 Sleep disorder, unspecified: Secondary | ICD-10-CM

## 2016-05-04 ENCOUNTER — Encounter (INDEPENDENT_AMBULATORY_CARE_PROVIDER_SITE_OTHER): Payer: Self-pay | Admitting: Orthopedic Surgery

## 2016-05-04 ENCOUNTER — Ambulatory Visit (INDEPENDENT_AMBULATORY_CARE_PROVIDER_SITE_OTHER): Payer: Medicare Other | Admitting: Orthopedic Surgery

## 2016-05-04 DIAGNOSIS — M48061 Spinal stenosis, lumbar region without neurogenic claudication: Secondary | ICD-10-CM

## 2016-05-04 NOTE — Progress Notes (Signed)
Office Visit Note   Patient: [name] Morningddie Dunkleberger Jr.           Date of Birth: 1940/04/01           MRN: [id number] Visit Date: 05/04/2016 Requested by: Marva PandaKimberly Millsaps, NP [facility] [name] [place], [place] [id number] PCP: Egbert GaribaldiMillsaps, KIMBERLY M, NP  Subjective: Chief Complaint  Patient presents with  . Lower Back - Follow-up    HPI: Link [name] [name] is a 76 year old patient with low back pain.  Since of [place] she's had an MRI scan.  That scan is reviewed today.  She has multiple compression fractures none of which have increased over time.  None of which show any new issues.  He's having some right-sided pain.  Sacral fracture was my concern that that is negative.  He is able to ambulate but is limited by his respiratory status.              ROS: All systems reviewed are negative as they relate to the chief complaint within the history of present illness.  Patient denies  fevers or chills.   Assessment & Plan: Visit Diagnoses:  1. Spinal stenosis of lumbar region, unspecified whether neurogenic claudication present     Plan: Plan is to refer her to Dr. Alvester MorinNewton for lumbar spine ESI evaluation.  He is not on blood thinners except for a small baby aspirin.  No sacral fractures.  This is a more of a management problem as opposed to a cure problem.  I'll see him back as needed  Follow-Up Instructions: No Follow-up on file.   Orders:  No orders of the defined types were placed in this encounter.  No orders of the defined types were placed in this encounter.     Procedures: No procedures performed   Clinical Data: No additional findings.  Objective: Vital Signs: There were no vitals taken for this visit.  Physical Exam:   Constitutional: Patient appears well-developed HEENT:  Head: Normocephalic Eyes:EOM are normal Neck: Normal range of motion Cardiovascular: Normal rate Pulmonary/chest: Effort normal Neurologic: Patient is alert Skin: Skin is  warm Psychiatric: Patient has normal mood and affect    Ortho Exam: Orthopedic exam demonstrates that the patient is on oxygen.  He has no nerve root tension signs.  Has 5 out of 5 ankle dorsi flexion plantar flexion quad and hamstring strength.  Both feet are perfused.  No groin pain with internal/external rotation of the leg.Marland Kitchen.  Specialty Comments:  No specialty comments available.  Imaging: No results found.   PMFS History: Patient Active Problem List   Diagnosis Date Noted  . Sleep disturbance 02/11/2016  . Subacute confusional state 02/11/2016  . HCAP (healthcare-associated pneumonia) 11/07/2015  . Acute kidney injury (HCC) 11/03/2015  . Hyponatremia 11/03/2015  . Falls   . Shortness of breath 10/25/2015  . Abdominal pain 08/19/2015  . Elevated troponin 08/19/2015  . Chest pressure 08/19/2015  . Chronic diastolic CHF (congestive heart failure) (HCC)   . Troponin level elevated   . B12 deficiency 05/12/2015  . Iron deficiency anemia   . Abdominal distension   . COPD exacerbation (HCC) 05/06/2015  . Influenza B 05/06/2015  . Normocytic anemia 05/06/2015  . Cataract of right eye 05/06/2015  . COPD (chronic obstructive pulmonary disease) (HCC) 05/09/2014  . Acute on chronic respiratory failure with hypoxia (HCC) 05/09/2014  . Influenza A H1N1 infection 05/09/2014  . AKI (acute kidney injury) (HCC) 05/09/2014  . Dehydration 05/09/2014  . Lobar pneumonia  due to unspecified organism 05/05/2014  . Tobacco abuse 01/19/2014  . History of DVT (deep vein thrombosis)   . On home oxygen therapy   . Hypokalemia 10/29/2013  . GERD 12/25/2008  . Essential hypertension 11/09/2008  . 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 Acute respiratory failure with hypoxia (HCC) 01/2014  . Chronic bronchitis    "get it q yr" (07/17/2013)  . Chronic diastolic CHF (congestive heart failure) (HCC)   .  COPD (chronic obstructive pulmonary disease) (HCC)   . Daily headache   . DVT (deep venous thrombosis) (HCC) 1990's   "?LLE"  . GERD (gastroesophageal reflux disease)    "related to RX I take" (07/17/2013)  . Hypertension   . Migraine    "all my life; no really bad migraines in 8-9 since work stress is gone" (07/17/2013)  . On home oxygen therapy    "2L 24/7" (08/19/2015)  . Pneumonia    "several times"    Family History  Problem Relation Age of Onset  . Breast cancer Mother   . Alzheimer's disease Father   . Alzheimer's disease Sister   . Tremor Brother     Past Surgical History:  Procedure Laterality Date  . CATARACT EXTRACTION W/ INTRAOCULAR LENS IMPLANT Right 05/05/2015  . INGUINAL HERNIA REPAIR Left ~ 1990  . TONSILLECTOMY  1949   Social History   Occupational History  . Retired     [employer]   Social History Main Topics  . Smoking status: Former Smoker    Packs/day: 1.00    Years: 10.00    Types: Cigarettes    Quit date: 02/06/1998  . Smokeless tobacco: Never Used  . Alcohol use No  . Drug use: No  . Sexual activity: No

## 2016-05-10 ENCOUNTER — Encounter (INDEPENDENT_AMBULATORY_CARE_PROVIDER_SITE_OTHER): Payer: Self-pay

## 2016-05-10 ENCOUNTER — Ambulatory Visit (INDEPENDENT_AMBULATORY_CARE_PROVIDER_SITE_OTHER): Payer: Medicare Other | Admitting: Orthopedic Surgery

## 2016-05-17 ENCOUNTER — Ambulatory Visit (INDEPENDENT_AMBULATORY_CARE_PROVIDER_SITE_OTHER): Payer: Medicare Other | Admitting: Physical Medicine and Rehabilitation

## 2016-05-17 ENCOUNTER — Ambulatory Visit (INDEPENDENT_AMBULATORY_CARE_PROVIDER_SITE_OTHER): Payer: Medicare Other

## 2016-05-17 ENCOUNTER — Encounter (INDEPENDENT_AMBULATORY_CARE_PROVIDER_SITE_OTHER): Payer: Self-pay | Admitting: Physical Medicine and Rehabilitation

## 2016-05-17 VITALS — BP 135/80 | HR 72 | Temp 98.0°F

## 2016-05-17 DIAGNOSIS — M5416 Radiculopathy, lumbar region: Secondary | ICD-10-CM

## 2016-05-17 MED ORDER — METHYLPREDNISOLONE ACETATE 80 MG/ML IJ SUSP
80.0000 mg | Freq: Once | INTRAMUSCULAR | Status: AC
  Administered 2016-05-17: 80 mg

## 2016-05-17 MED ORDER — LIDOCAINE HCL (PF) 1 % IJ SOLN
0.3300 mL | Freq: Once | INTRAMUSCULAR | Status: AC
  Administered 2016-05-17: 0.3 mL

## 2016-05-17 NOTE — Progress Notes (Signed)
[name] [name]. - 76 y.o. male MRN [id number]  Date of birth: [birth year]  Office Visit Note: Visit Date: 05/17/2016 PCP: [name], NP Referred by: Marva Panda, NP  Subjective: Chief Complaint  Patient presents with  . Lower Back - Pain   HPI: [name] [name] is a pleasant 76 year old woman with chronic COPD and medical problems and chronic low back pain particularly on the right.  He reports severe worsening over the last 3 weeks but progressively worsening before then. Patient states he has a constant throbbing pain. Grabbing pain at times. Denies leg pain. He is followed by Dr. August Saucer who requested potential epidural injection. He had listed under allergies and allergy to prednisone. This was listed as given him a stomach pain. I asked him about this along with his caregivers and basically he is taking prednisone now 20 mg daily for COPD and that this was probably entered and Denny Peon some point in his medical life. I did discontinue the allergy to prednisone in the chart.    ROS Otherwise per HPI.  Assessment & Plan: Visit Diagnoses:  1. Lumbar radiculopathy     Plan: Findings:  Right L5 transforaminal epidural steroid injection fluoroscopic guidance. Depending on the patient's relief I would look at a facet joint block at this level. He is characteristic of his pain and timing seems to be related more to nerve root irritation. He does have discussed extrusion at this level but without compression. He does have foraminal narrowing as well and facet arthropathy. He could be a combination of the two.    Meds & Orders:  Meds ordered this encounter  Medications  . lidocaine (PF) (XYLOCAINE) 1 % injection 0.3 mL  . methylPREDNISolone acetate (DEPO-MEDROL) injection 80 mg    Orders Placed This Encounter  Procedures  . XR C-ARM NO REPORT  . Epidural Steroid injection    Follow-up: Return if symptoms worsen or fail to improve.   Procedures: No procedures performed    Lumbosacral Transforaminal Epidural Steroid Injection - Infraneural Approach with Fluoroscopic Guidance  Patient: [name] [name].      Date of Birth: [birth year] MRN: [id number] PCP: [name], NP      Visit Date: 05/17/2016   Universal Protocol:    Date/Time: 04/12/186:05 AM  Consent Given By: the patient  Position: PRONE   Additional Comments: Vital signs were monitored before and after the procedure. Patient was prepped and draped in the usual sterile fashion. The correct patient, procedure, and site was verified.   Injection Procedure Details:  Procedure Site One Meds Administered:  Meds ordered this encounter  Medications  . lidocaine (PF) (XYLOCAINE) 1 % injection 0.3 mL  . methylPREDNISolone acetate (DEPO-MEDROL) injection 80 mg      Laterality: Right  Location/Site:  L5-S1  Needle size: 22 G  Needle type: Spinal  Needle Placement: Transforaminal  Findings:  -Contrast Used: 1 mL iohexol 180 mg iodine/mL   -Comments: Excellent flow of contrast along the nerve and into the epidural space.  Procedure Details: After squaring off the end-plates of the desired vertebral level to get a true AP view, the C-arm was obliqued to the painful side so that the superior articulating process is positioned about 1/3 the length of the inferior endplate.  The needle was aimed toward the junction of the superior articular process and the transverse process of the inferior vertebrae. The needle's initial entry is in the lower third of the foramen through Kambin's triangle. The soft tissues overlying  this target were infiltrated with 2-3 ml. of 1% Lidocaine without Epinephrine.  The spinal needle was then inserted and advanced toward the target using a "trajectory" view along the fluoroscope beam.  Under AP and lateral visualization, the needle was advanced so it did not puncture dura and did not traverse medially beyond the 6 o'clock position of the pedicle. Bi-planar  projections were used to confirm position. Aspiration was confirmed to be negative for CSF and/or blood. A 1-2 ml. volume of Isovue-250 was injected and flow of contrast was noted at each level. Radiographs were obtained for documentation purposes.   After attaining the desired flow of contrast documented above, a 0.5 to 1.0 ml test dose of 0.25% Marcaine was injected into each respective transforaminal space.  The patient was observed for 90 seconds post injection.  After no sensory deficits were reported, and normal lower extremity motor function was noted,   the above injectate was administered so that equal amounts of the injectate were placed at each foramen (level) into the transforaminal epidural space.   Additional Comments:  The patient tolerated the procedure well Dressing: Band-Aid    Post-procedure details: Patient was observed during the procedure. Post-procedure instructions were reviewed.  Patient left the clinic in stable condition.   Clinical History: Lumbar spine MRI 04/28/2016  L5-S1: Moderate facet hypertrophy and small disc bulge with superimposed central extrusion with minimal superior migration. No spinal canal stenosis. Moderate bilateral foraminal stenosis.   Visualized sacrum: Normal.   IMPRESSION: 1. Compression deformities of multiple vertebral bodies, some of which were present on remote prior studies and all of which are unchanged from 04/13/2016. No acute compression fracture. 2. Moderate bilateral L5-S1 neural foraminal stenosis due to combination of disc degeneration and facet hypertrophy. 3. Moderate right T12-L1 foraminal narrowing due to endplate spurring.  He reports that he quit smoking about 18 years ago. His smoking use included Cigarettes. He has a 10.00 pack-year smoking history. He has never used smokeless tobacco.   Recent Labs  08/20/15 0246  HGBA1C 5.9*    Objective:  VS:  HT:    WT:   BMI:     BP:135/80  HR:72bpm  TEMP:98 F  (36.7 C)( )  RESP:100 % Physical Exam  Musculoskeletal:  Patient ambulates with some difficulty using a rolling walker. He has good distal strength. He does have some tenderness to palpation along the paraspinal musculature. There is no induration or swelling.    Ortho Exam Imaging: Xr C-arm No Report  Result Date: 05/17/2016 Please see Notes or Procedures tab for imaging impression.   Past Medical/Family/Surgical/Social History: Medications & Allergies reviewed per EMR Patient Active Problem List   Diagnosis Date Noted  . Sleep disturbance 02/11/2016  . Subacute confusional state 02/11/2016  . HCAP (healthcare-associated pneumonia) 11/07/2015  . Acute kidney injury (HCC) 11/03/2015  . Hyponatremia 11/03/2015  . Falls   . Shortness of breath 10/25/2015  . Abdominal pain 08/19/2015  . Elevated troponin 08/19/2015  . Chest pressure 08/19/2015  . Chronic diastolic CHF (congestive heart failure) (HCC)   . Troponin level elevated   . B12 deficiency 05/12/2015  . Iron deficiency anemia   . Abdominal distension   . COPD exacerbation (HCC) 05/06/2015  . Influenza B 05/06/2015  . Normocytic anemia 05/06/2015  . Cataract of right eye 05/06/2015  . COPD (chronic obstructive pulmonary disease) (HCC) 05/09/2014  . Acute on chronic respiratory failure with hypoxia (HCC) 05/09/2014  . Influenza A H1N1 infection 05/09/2014  . AKI (acute  kidney injury) (HCC) 05/09/2014  . Dehydration 05/09/2014  . Lobar pneumonia due to unspecified organism 05/05/2014  . Tobacco abuse 01/19/2014  . History of DVT (deep vein thrombosis)   . On home oxygen therapy   . Hypokalemia 10/29/2013  . GERD 12/25/2008  . Essential hypertension 11/09/2008  . 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 Acute respiratory failure with hypoxia (HCC) 01/2014  . Chronic bronchitis    "get it q yr" (07/17/2013)  . Chronic diastolic  CHF (congestive heart failure) (HCC)   . COPD (chronic obstructive pulmonary disease) (HCC)   . Daily headache   . DVT (deep venous thrombosis) (HCC) 1990's   "?LLE"  . GERD (gastroesophageal reflux disease)    "related to RX I take" (07/17/2013)  . Hypertension   . Migraine    "all my life; no really bad migraines in 8-9 since work stress is gone" (07/17/2013)  . On home oxygen therapy    "2L 24/7" (08/19/2015)  . Pneumonia    "several times"   Family History  Problem Relation Age of Onset  . Breast cancer Mother   . Alzheimer's disease Father   . Alzheimer's disease Sister   . Tremor Brother    Past Surgical History:  Procedure Laterality Date  . CATARACT EXTRACTION W/ INTRAOCULAR LENS IMPLANT Right 05/05/2015  . INGUINAL HERNIA REPAIR Left ~ 1990  . TONSILLECTOMY  1949   Social History   Occupational History  . Retired     [employer]   Social History Main Topics  . Smoking status: Former Smoker    Packs/day: 1.00    Years: 10.00    Types: Cigarettes    Quit date: 02/06/1998  . Smokeless tobacco: Never Used  . Alcohol use No  . Drug use: No  . Sexual activity: No

## 2016-05-17 NOTE — Patient Instructions (Signed)

## 2016-05-18 NOTE — Procedures (Signed)
Lumbosacral Transforaminal Epidural Steroid Injection - Infraneural Approach with Fluoroscopic Guidance  Patient: [name] [name].      Date of Birth: 05/17/1940 MRN: [id number] PCP: [name], NP      Visit Date: 05/17/2016   Universal Protocol:    Date/Time: 04/12/186:05 AM  Consent Given By: the patient  Position: PRONE   Additional Comments: Vital signs were monitored before and after the procedure. Patient was prepped and draped in the usual sterile fashion. The correct patient, procedure, and site was verified.   Injection Procedure Details:  Procedure Site One Meds Administered:  Meds ordered this encounter  Medications  . lidocaine (PF) (XYLOCAINE) 1 % injection 0.3 mL  . methylPREDNISolone acetate (DEPO-MEDROL) injection 80 mg      Laterality: Right  Location/Site:  L5-S1  Needle size: 22 G  Needle type: Spinal  Needle Placement: Transforaminal  Findings:  -Contrast Used: 1 mL iohexol 180 mg iodine/mL   -Comments: Excellent flow of contrast along the nerve and into the epidural space.  Procedure Details: After squaring off the end-plates of the desired vertebral level to get a true AP view, the C-arm was obliqued to the painful side so that the superior articulating process is positioned about 1/3 the length of the inferior endplate.  The needle was aimed toward the junction of the superior articular process and the transverse process of the inferior vertebrae. The needle's initial entry is in the lower third of the foramen through Kambin's triangle. The soft tissues overlying this target were infiltrated with 2-3 ml. of 1% Lidocaine without Epinephrine.  The spinal needle was then inserted and advanced toward the target using a "trajectory" view along the fluoroscope beam.  Under AP and lateral visualization, the needle was advanced so it did not puncture dura and did not traverse medially beyond the 6 o'clock position of the pedicle. Bi-planar  projections were used to confirm position. Aspiration was confirmed to be negative for CSF and/or blood. A 1-2 ml. volume of Isovue-250 was injected and flow of contrast was noted at each level. Radiographs were obtained for documentation purposes.   After attaining the desired flow of contrast documented above, a 0.5 to 1.0 ml test dose of 0.25% Marcaine was injected into each respective transforaminal space.  The patient was observed for 90 seconds post injection.  After no sensory deficits were reported, and normal lower extremity motor function was noted,   the above injectate was administered so that equal amounts of the injectate were placed at each foramen (level) into the transforaminal epidural space.   Additional Comments:  The patient tolerated the procedure well Dressing: Band-Aid    Post-procedure details: Patient was observed during the procedure. Post-procedure instructions were reviewed.  Patient left the clinic in stable condition.

## 2016-06-28 ENCOUNTER — Ambulatory Visit (INDEPENDENT_AMBULATORY_CARE_PROVIDER_SITE_OTHER): Payer: Medicare Other | Admitting: Neurology

## 2016-06-28 ENCOUNTER — Encounter: Payer: Self-pay | Admitting: Neurology

## 2016-06-28 VITALS — BP 126/69 | HR 95 | Ht 67.5 in | Wt 155.0 lb

## 2016-06-28 DIAGNOSIS — G479 Sleep disorder, unspecified: Secondary | ICD-10-CM

## 2016-06-28 MED ORDER — ARIPIPRAZOLE 5 MG PO TABS: 5.0000 mg | ORAL_TABLET | Freq: Every day | ORAL | 3 refills | Status: AC

## 2016-06-28 NOTE — Patient Instructions (Signed)
   We will try abilify for the episodes. Call for any dose adjustments.

## 2016-06-28 NOTE — Progress Notes (Signed)
Reason for visit: Sleep disturbance  [name] Troia. is an 76 y.o. male  History of present illness:  [name] [name] is a 76 year old right-handed white male with a history of COPD on oxygen. The patient has had unusual episodes where he will see things happening multiple times during the day, this is a daily occurrence. He may see the same car going down the road again and again. When watching TV, he will see the same show again and again. He indicates that he may have insomnia on average 2 nights a week, but he sleeps well the other nights. Even on days after he has had a good night sleep he still has the above episodes. He does nap frequently during the day. The patient claims that he has a good energy level, he does not feel fatigued. He returns for an evaluation. He was set up for a sleep evaluation, but he never heard anything about scheduling of this consultation.    Past Medical History:  Diagnosis Date  . Acute respiratory failure with hypoxia (HCC) 01/2014  . Chronic bronchitis    "get it q yr" (07/17/2013)  . Chronic diastolic CHF (congestive heart failure) (HCC)   . COPD (chronic obstructive pulmonary disease) (HCC)   . Daily headache   . DVT (deep venous thrombosis) (HCC) 1990's   "?LLE"  . GERD (gastroesophageal reflux disease)    "related to RX I take" (07/17/2013)  . Hypertension   . Migraine    "all my life; no really bad migraines in 8-9 since work stress is gone" (07/17/2013)  . On home oxygen therapy    "2L 24/7" (08/19/2015)  . Pneumonia    "several times"    Past Surgical History:  Procedure Laterality Date  . CATARACT EXTRACTION W/ INTRAOCULAR LENS IMPLANT Right 05/05/2015  . INGUINAL HERNIA REPAIR Left ~ 1990  . TONSILLECTOMY  1949    Family History  Problem Relation Age of Onset  . Breast cancer Mother   . Alzheimer's disease Father   . Alzheimer's disease Sister   . Tremor Brother     Social history:  reports that he quit smoking about 18 years  ago. His smoking use included Cigarettes. He has a 10.00 pack-year smoking history. He has never used smokeless tobacco. He reports that he does not drink alcohol or use drugs.    Allergies  Allergen Reactions  . Adhesive [Tape]     Tears skin, prefers paper tape  . Advair Diskus [Fluticasone-Salmeterol] Other (See Comments)    Blurred vision  . Brovana [Arformoterol]     Ineffective--exacerbated coughing/congestion  . Codeine Other (See Comments)    Hallucinations, blurred vision  . Ferrous Sulfate Other (See Comments)    Stomach pain  . Other Swelling    Bicillin injection in 1961--swelling in lower extremities and joints, and whelps up arm.  Has taken penicillin since w/o reaction.    Medications:  Prior to Admission medications   Medication Sig Start Date End Date Taking? Authorizing Provider  acetaminophen (TYLENOL) 325 MG tablet Take 2 tablets (650 mg total) by mouth every 6 (six) hours as needed for mild pain (or Fever >/= 101). 11/06/15  Yes Alison Murray, MD  albuterol (ACCUNEB) 0.63 MG/3ML nebulizer solution Take 1 ampule by nebulization every 6 (six) hours as needed for wheezing.   Yes [provider]  albuterol (PROVENTIL HFA;VENTOLIN HFA) 108 (90 BASE) MCG/ACT inhaler Inhale 2 puffs into the lungs every 6 (six) hours as needed for  wheezing or shortness of breath. 04/13/14  Yes Storm Frisk, MD  ALPRAZolam Prudy Feeler) 0.5 MG tablet Take 1 tablet (0.5 mg total) by mouth 4 (four) times daily as needed for anxiety. 11/10/15  Yes Dhungel, Nishant, MD  aspirin EC 81 MG EC tablet Take 1 tablet (81 mg total) by mouth daily. 10/28/15  Yes Wendee Beavers, DO  atorvastatin (LIPITOR) 40 MG tablet Take 1 tablet (40 mg total) by mouth daily at 6 PM. 10/27/15  Yes Wendee Beavers, DO  Calcium Carbonate Antacid (ANTACID PO) Take 1 tablet by mouth every 4 (four) hours as needed (for acid reflux or heartburn).    Yes [provider]  dextromethorphan-guaiFENesin (MUCINEX  DM) 30-600 MG 12hr tablet Take 1 tablet by mouth 2 (two) times daily. Patient taking differently: Take 1 tablet by mouth 2 (two) times daily as needed for cough.  05/10/15  Yes Ruben Im, MD  feeding supplement, ENSURE ENLIVE, (ENSURE ENLIVE) LIQD Take 237 mLs by mouth 2 (two) times daily between meals. 11/10/15  Yes Dhungel, Nishant, MD  furosemide (LASIX) 40 MG tablet Take 1 tablet (40 mg total) by mouth daily. 11/06/15  Yes Alison Murray, MD  ipratropium-albuterol (DUONEB) 0.5-2.5 (3) MG/3ML SOLN Take 3 mLs by nebulization every 6 (six) hours as needed. Patient taking differently: Take 3 mLs by nebulization every 6 (six) hours as needed (for wheezing or shortness of breath).  05/12/14  Yes Albertine Grates, MD  mometasone-formoterol [facility]) 200-5 MCG/ACT AERO Inhale 2 puffs into the lungs 2 (two) times daily.   Yes [provider]  ondansetron (ZOFRAN) 8 MG tablet Take 8 mg by mouth every 8 (eight) hours as needed for nausea or vomiting.  08/19/15  Yes [provider]  pantoprazole (PROTONIX) 40 MG tablet Take 1 tablet (40 mg total) by mouth 2 (two) times daily. 11/01/13  Yes Vassie Loll, MD  polyethylene glycol [facility] Ethelene Hal) packet Take 17 g by mouth daily as needed for mild constipation. 08/23/15  Yes Zannie Cove, MD  potassium chloride SA (K-DUR,KLOR-CON) 10 MEQ tablet Take 1 tablet (10 mEq total) by mouth daily. 11/06/15  Yes Alison Murray, MD  predniSONE (DELTASONE) 20 MG tablet Take 1 tablet (20 mg total) by mouth daily with breakfast. Patient taking differently: Take 20 mg by mouth daily as needed.  11/10/15  Yes Dhungel, Nishant, MD  sodium chloride (OCEAN) 0.65 % SOLN nasal spray Place 1 spray into the nose as needed for congestion (Dry nasal passages).   Yes [provider]  SPIRIVA HANDIHALER 18 MCG inhalation capsule Place 1 puff into inhaler and inhale daily as needed for wheezing. 09/07/15  Yes [provider]  traMADol (ULTRAM) 50 MG tablet Take 1  tablet (50 mg total) by mouth daily as needed for moderate pain. 11/10/15  Yes Dhungel, Nishant, MD  vitamin E 100 UNIT capsule Take 100 Units by mouth daily as needed. supplement   Yes [provider]    ROS:  Out of a complete 14 system review of symptoms, the patient complains only of the following symptoms, and all other reviewed systems are negative.  Ringing in the ears Blurred vision Restless legs, insomnia, frequent waking, daytime sleepiness Bruising easily  Blood pressure 126/69, pulse 95, height 5' 7.5" (1.715 m), weight 155 lb (70.3 kg), SpO2 96 %.  Physical Exam  General: The patient is alert and cooperative at the time of the examination.  Skin: No significant peripheral edema is noted.   Neurologic  Exam  Mental status: The patient is alert and oriented x 3 at the time of the examination. The patient has apparent normal recent and remote memory, with an apparently normal attention span and concentration ability.   Cranial nerves: Facial symmetry is present. Speech is normal, no aphasia or dysarthria is noted. Extraocular movements are full. Visual fields are full.  Motor: The patient has good strength in all 4 extremities.  Sensory examination: Soft touch sensation is symmetric on the face, arms, and legs.  Coordination: The patient has good finger-nose-finger and heel-to-shin bilaterally.  Gait and station: The patient has a slightly wide-based gait, the patient is able to ambulate independently. He usually uses a walker for ambulation. Tandem gait was not attempted. Romberg is negative. No drift is seen.  Reflexes: Deep tendon reflexes are symmetric.   Assessment/Plan:  1. Episodic "daydreaming"  The patient is seeing repetitive occurrences during the day, etiology is not clear. He claims that he is getting good sleep about 5 nights a week. Even after a good night sleep he still has episodes, but he continues to have significant drowsiness and  sleepiness during the day. He will be given a trial on Abilify taking 5 mg daily, if this does not help after 6 weeks he will stop the medication. The patient will follow-up in 4 months. If the episodes continue, I will reorder a sleep consultation.   Marlan Palau. Keith Jahkeem Kurka MD 06/28/2016 12:11 PM  [facility] [address] [place], [place] [id number]  Phone [phone] Fax [phone]

## 2016-09-06 DEATH — deceased

## 2016-11-09 ENCOUNTER — Telehealth: Payer: Self-pay | Admitting: Neurology

## 2016-11-09 ENCOUNTER — Ambulatory Visit: Payer: Medicare Other | Admitting: Neurology

## 2016-11-09 NOTE — Telephone Encounter (Signed)
This patient did not show for a revisit on today. 

## 2016-11-10 ENCOUNTER — Encounter: Payer: Self-pay | Admitting: Neurology

## 2016-12-19 NOTE — Telephone Encounter (Signed)
[name] [name] [phone] called to advise the pt passed on 08/20/2016

## 2017-11-23 IMAGING — DX DG CHEST 2V
2 series · 2 of 2 positions shown · non-contrast
Comparison: 05/06/2015

CLINICAL DATA: Shortness of Breath

EXAM:
CHEST  2 VIEW

[chest pa]
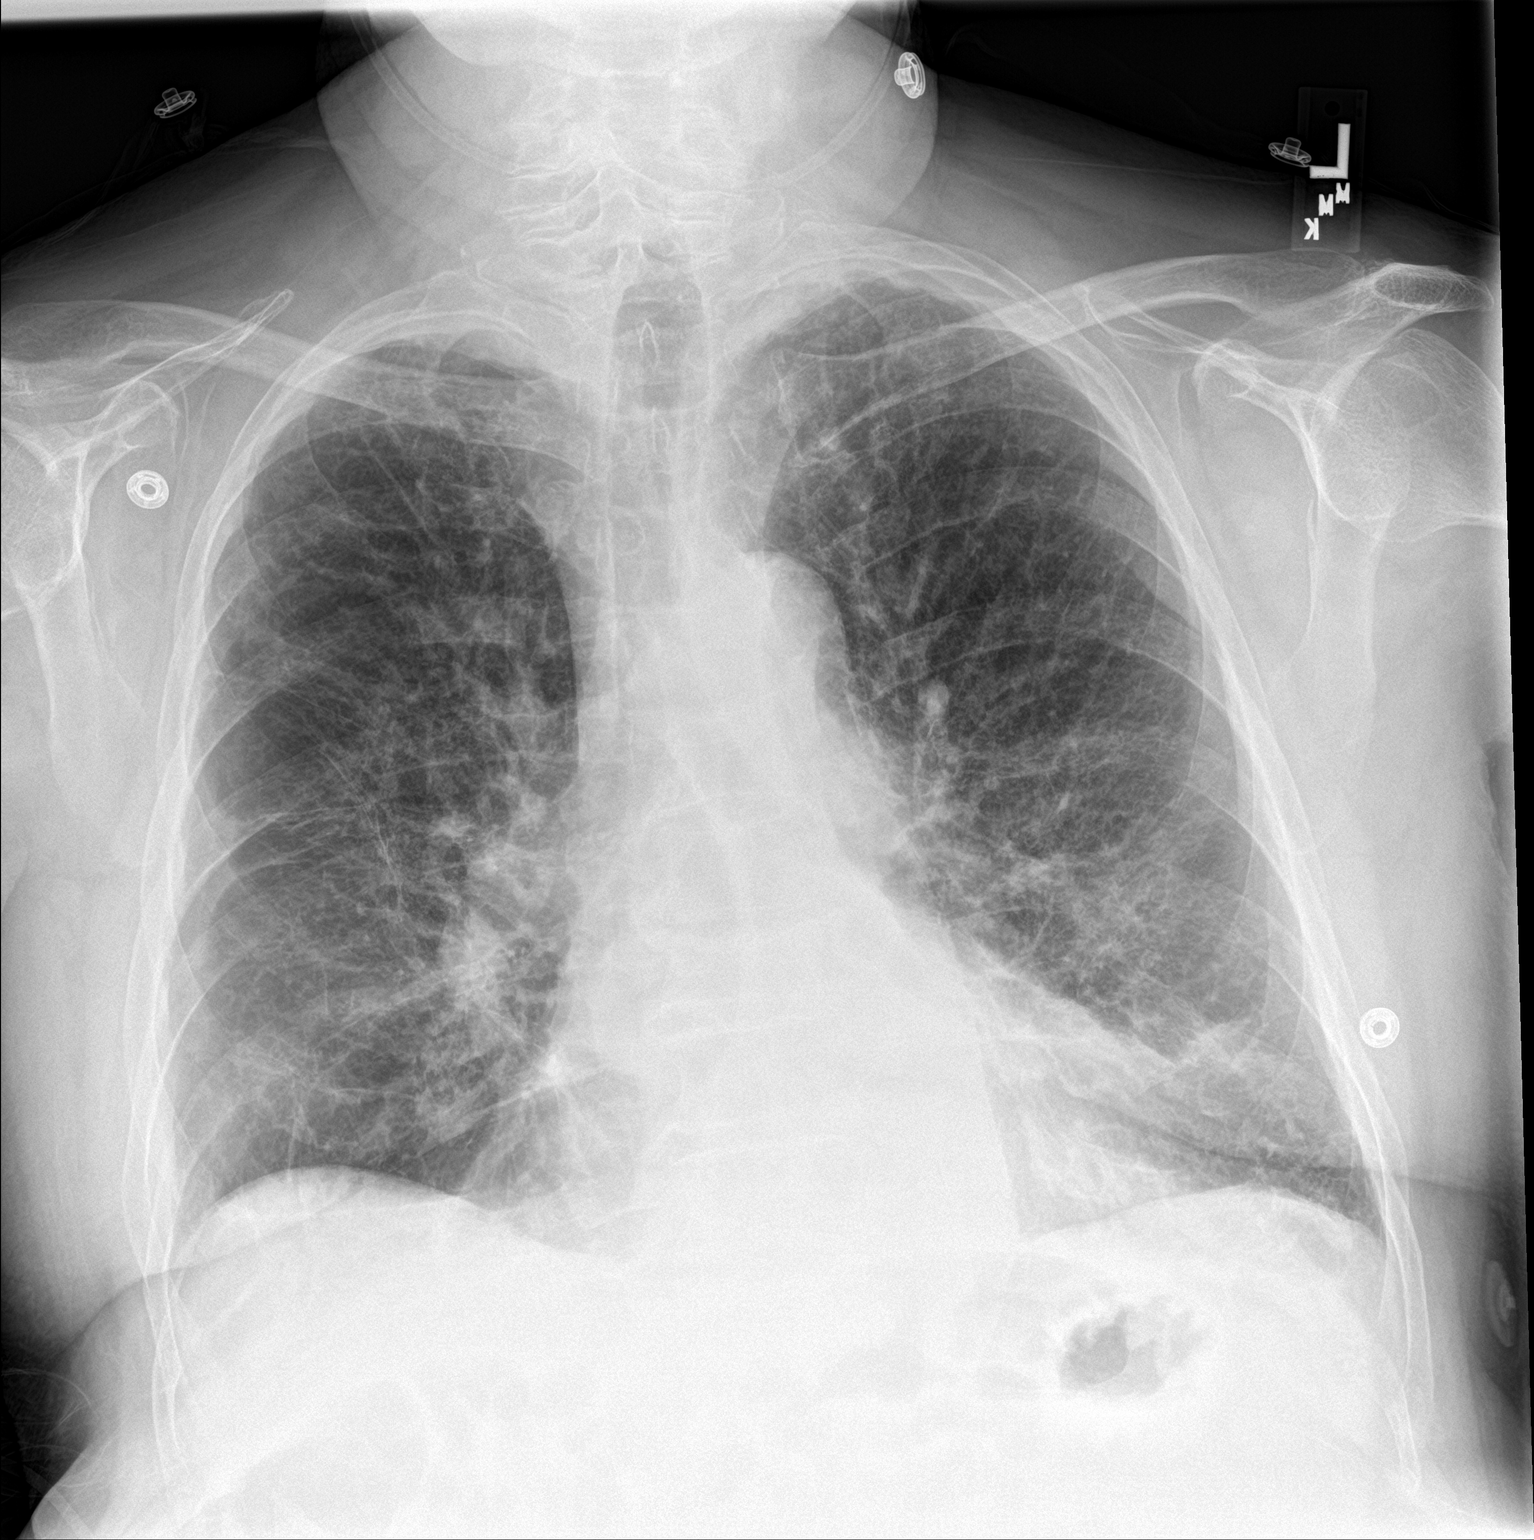

[chest lat]
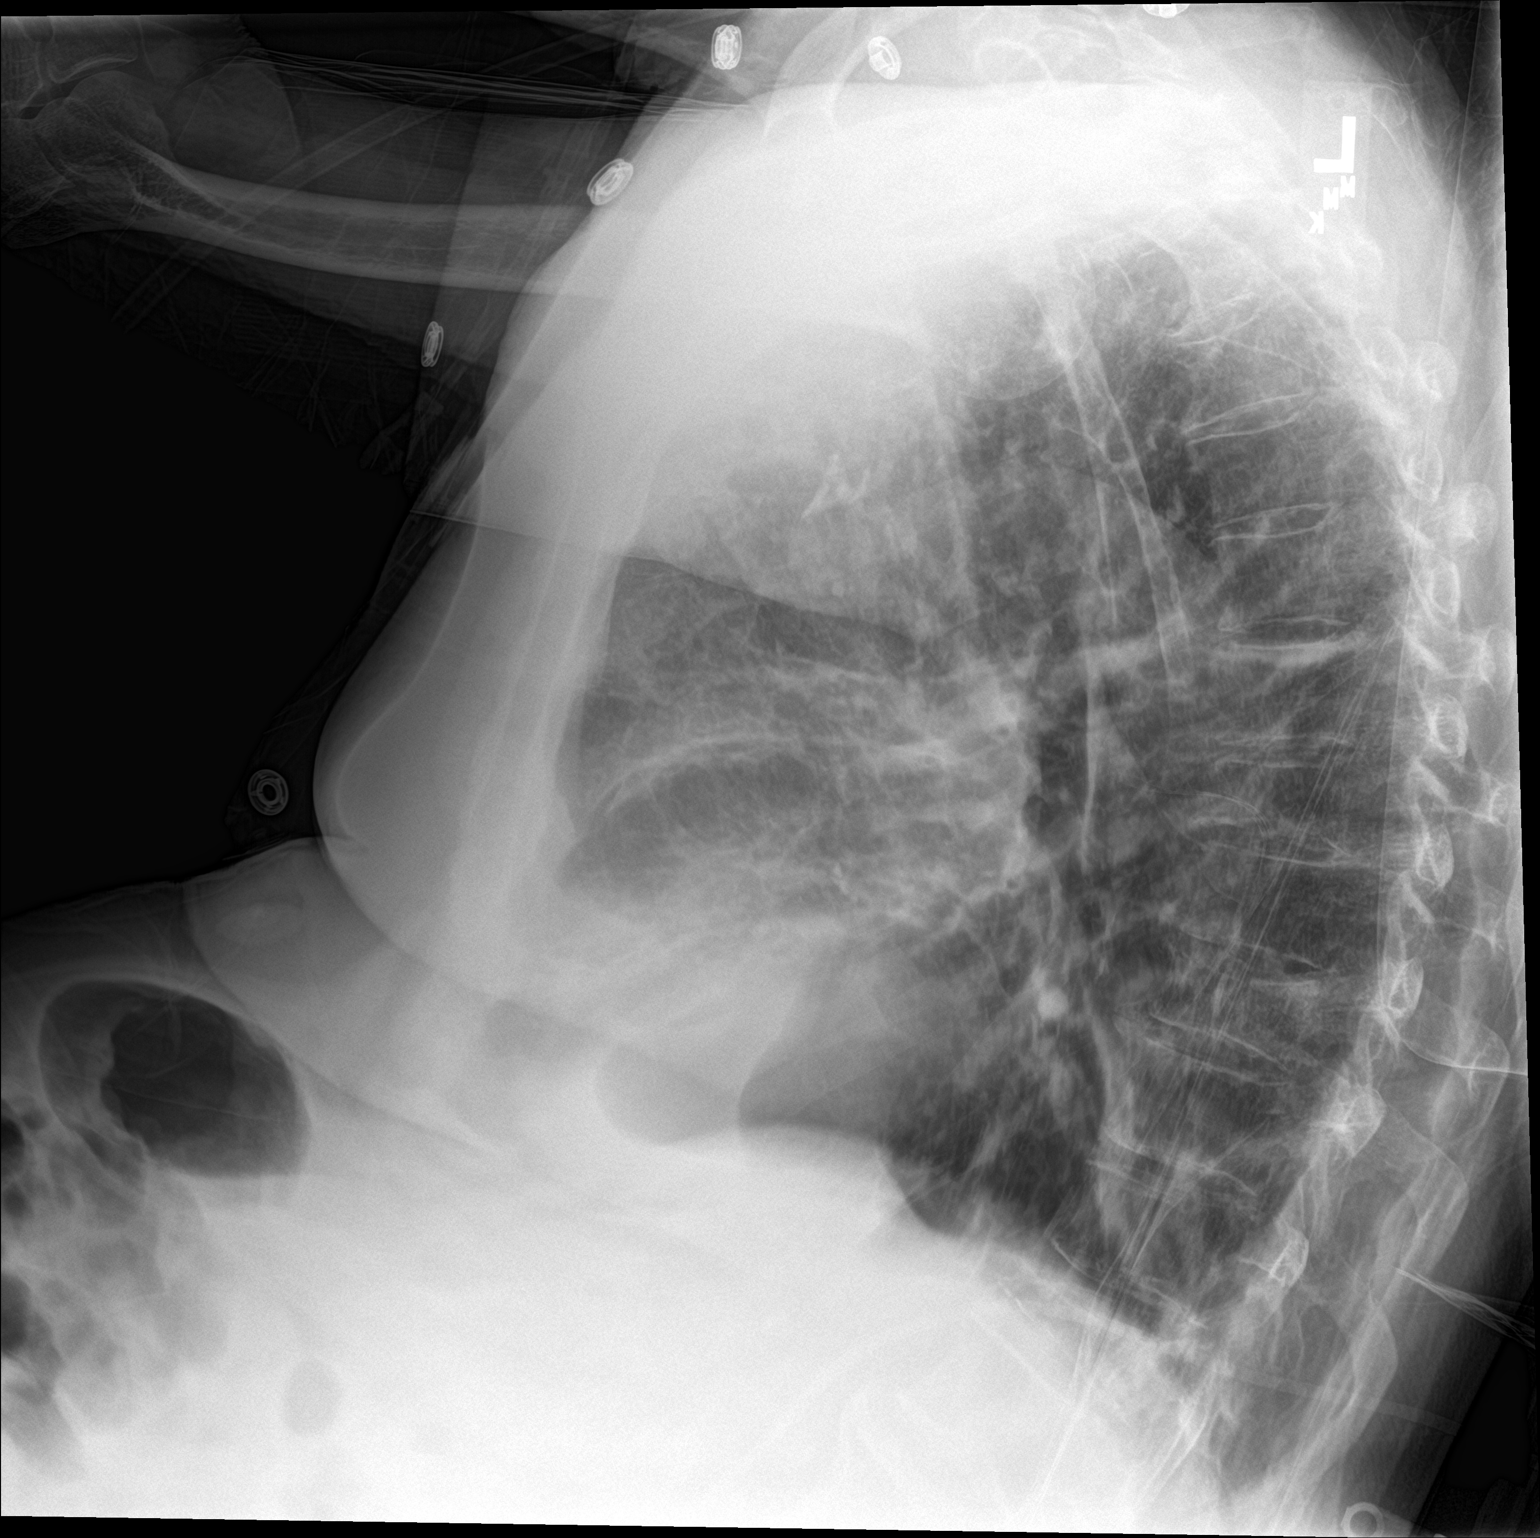

[2 of 2 positions shown; findings below may reference images not displayed]

FINDINGS: Cardiac shadow is within normal limits. Diffuse interstitial changes
are again identified but stable from the prior exam consistent with
underlying scarring. No focal infiltrate or sizable effusion is
seen. Compression deformities are again noted in the mid and lower
thoracic spine. Some of these have progressed in the interval from
the prior exam. Correlation with point tenderness is recommended.
IMPRESSION: Chronic interstitial changes.

Compression deformities of the thoracic spine which have progressed
in the interval from the prior exam. Correlation to point tenderness
is recommended.

## 2017-11-24 IMAGING — DX DG LUMBAR SPINE 2-3V
3 series · 3 of 3 positions shown · non-contrast
Comparison: CT 08/19/2015.

CLINICAL DATA: Fall.

EXAM:
LUMBAR SPINE - 2-3 VIEW

[l-spine ap]
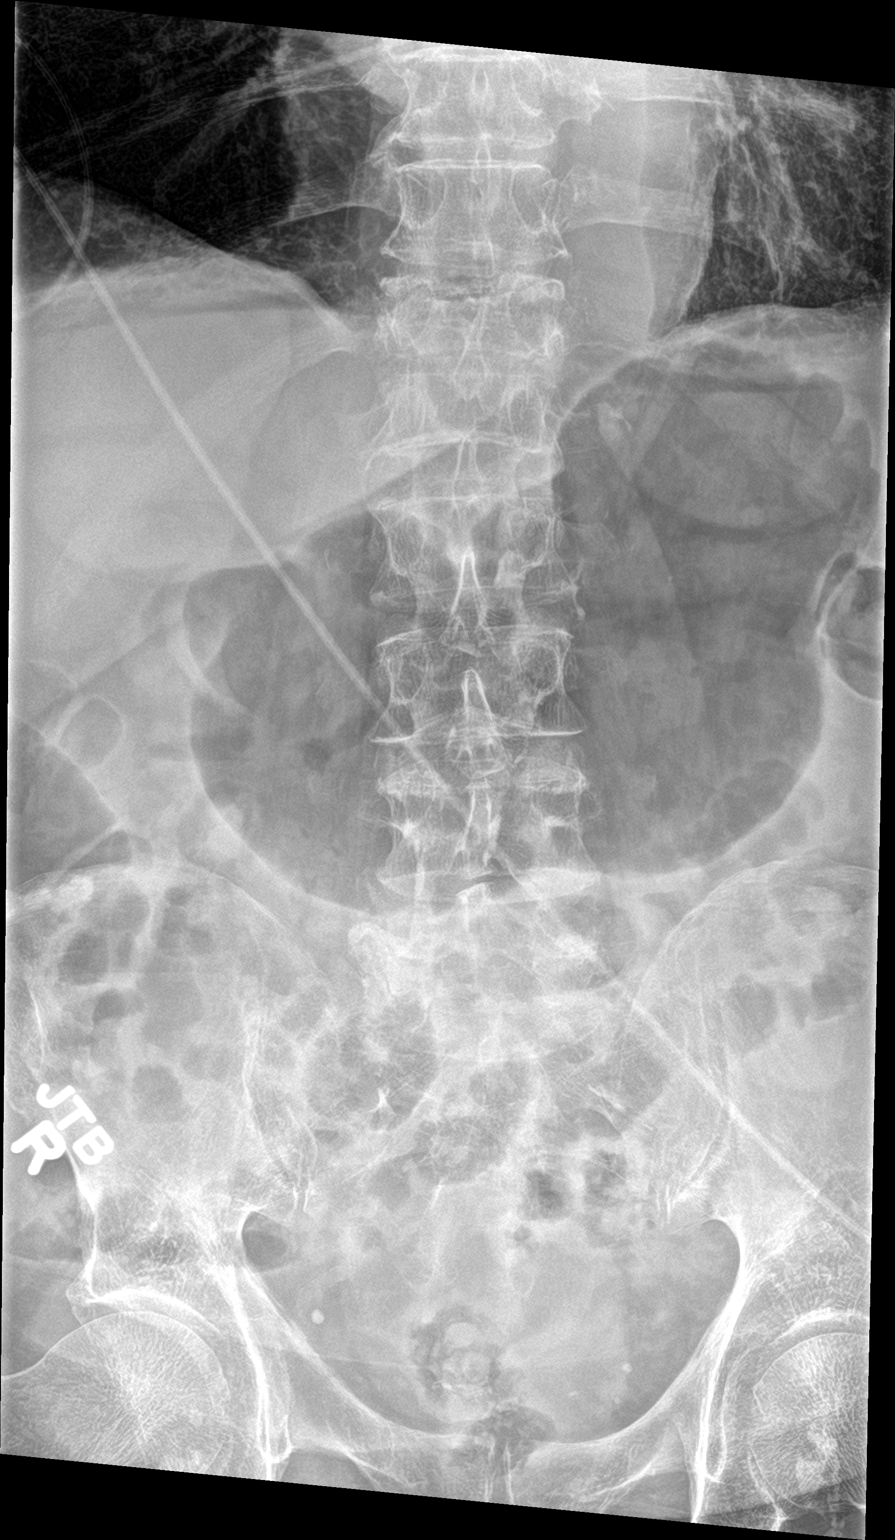

[l-spine lat]
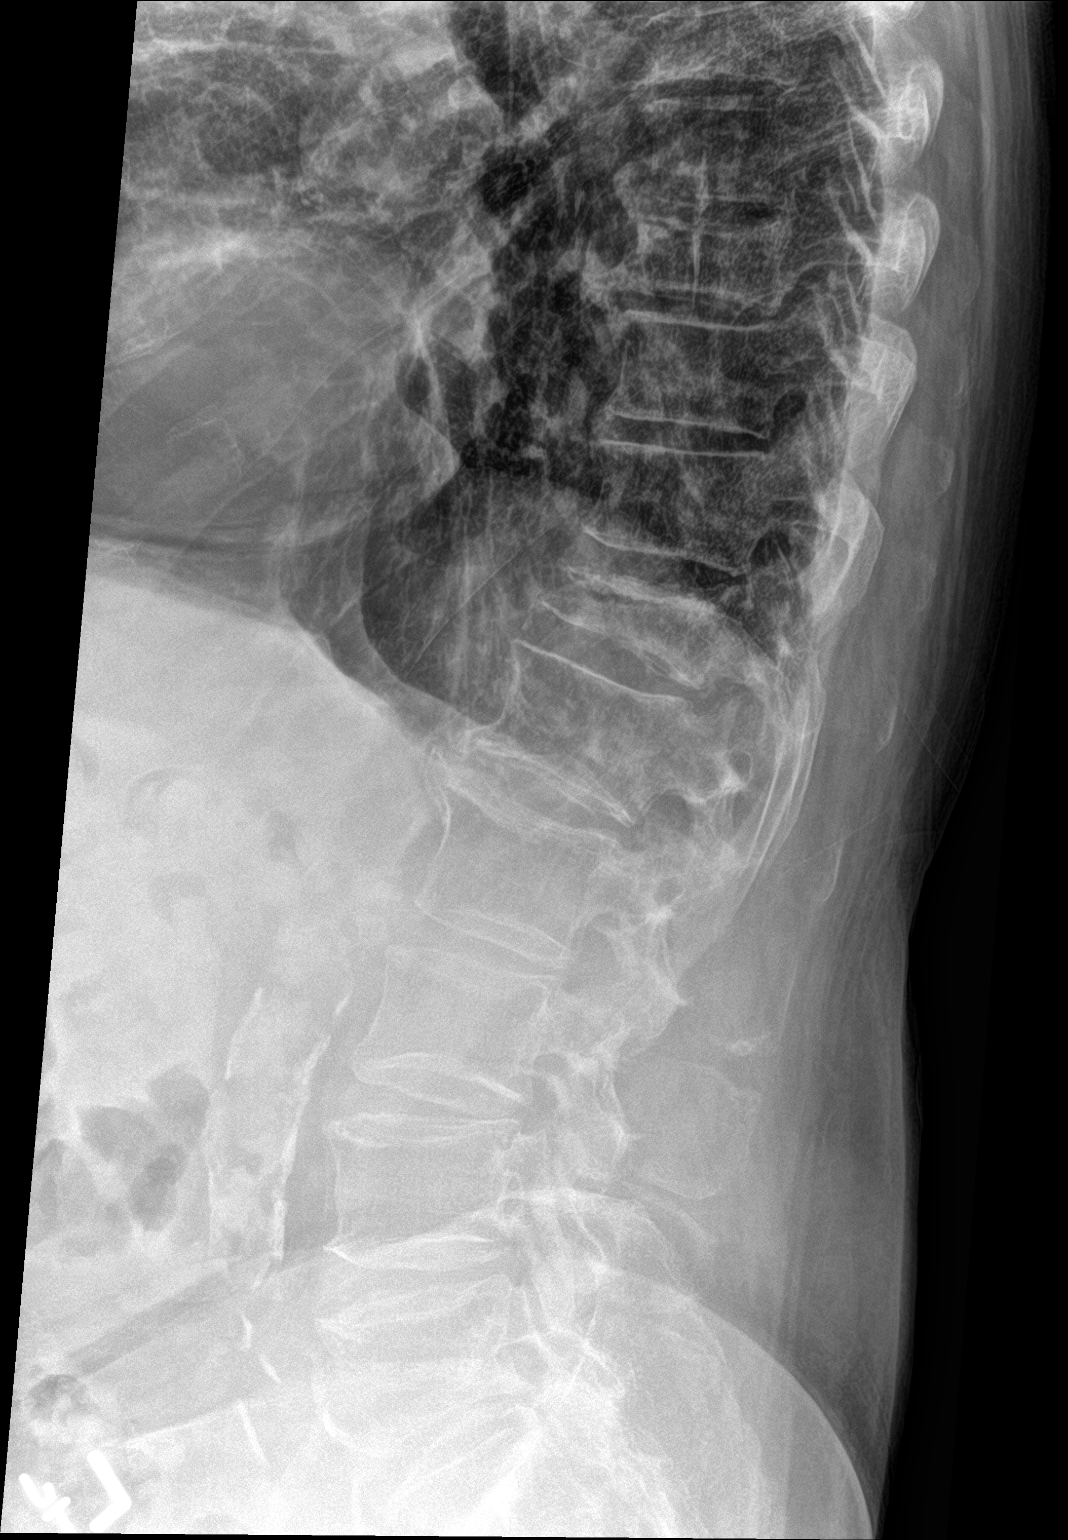

[l-spine spot]
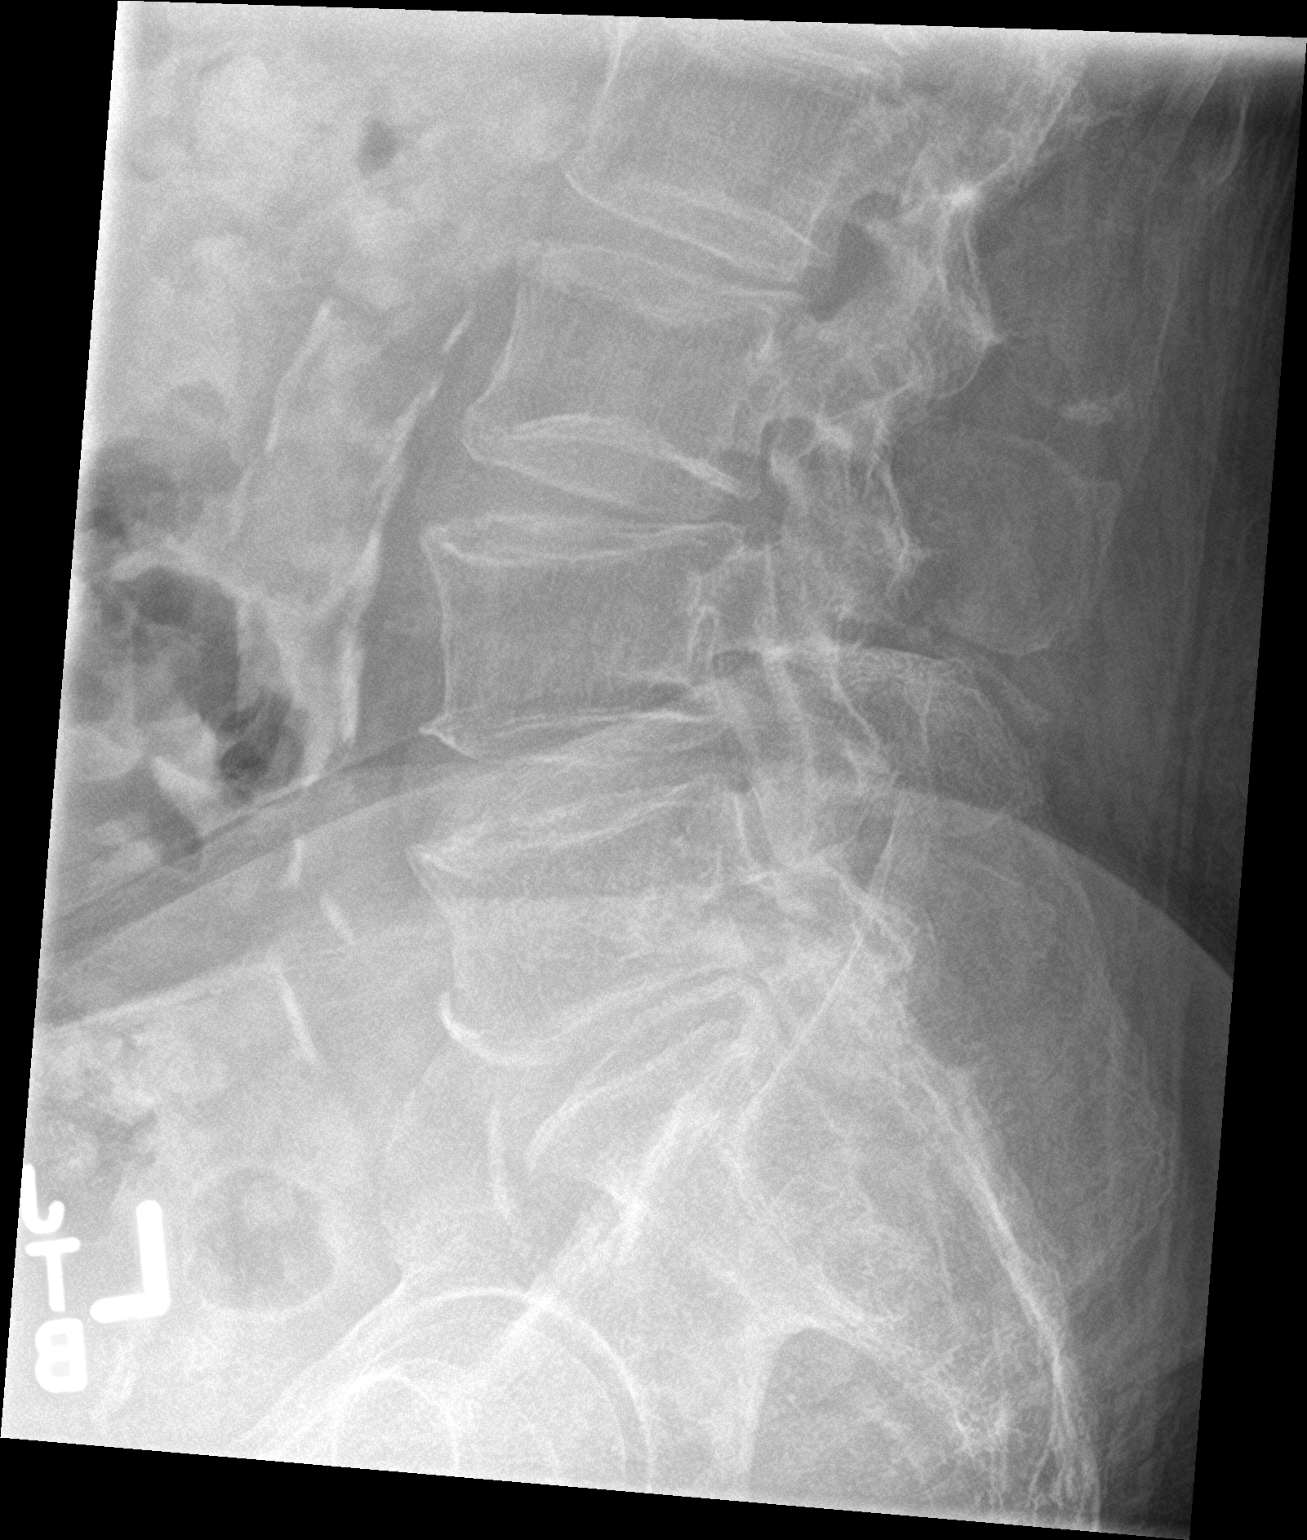

[3 of 3 positions shown; findings below may reference images not displayed]

FINDINGS: T12 compression fracture is noted. Compression fractures prominent.
This appears new from prior CT of 08/19/2015. Diffuse osteopenia and
degenerative change. Moderate gastric distention noted.
IMPRESSION: 1. T12 prominent compression fracture noted. This is new from CT of
08/19/2015. T9 compression fracture cannot be excluded.

2. Diffuse osteopenia degenerative change.

3. Moderate gastric distention.
